# Patient Record
Sex: Male | Born: 1960
Health system: Southern US, Community
[De-identification: ages and names within clinical notes are randomized; demographics above are authoritative.]

## PROBLEM LIST (undated history)

## (undated) DIAGNOSIS — T7840XA Allergy, unspecified, initial encounter: Secondary | ICD-10-CM

## (undated) DIAGNOSIS — I10 Essential (primary) hypertension: Secondary | ICD-10-CM

## (undated) DIAGNOSIS — G4730 Sleep apnea, unspecified: Secondary | ICD-10-CM

## (undated) DIAGNOSIS — J45909 Unspecified asthma, uncomplicated: Secondary | ICD-10-CM

## (undated) HISTORY — DX: Unspecified asthma, uncomplicated: J45.909

## (undated) HISTORY — DX: Essential (primary) hypertension: I10

## (undated) HISTORY — DX: Allergy, unspecified, initial encounter: T78.40XA

## (undated) HISTORY — PX: ACHILLES TENDON REPAIR: SUR1153

## (undated) HISTORY — DX: Sleep apnea, unspecified: G47.30

## (undated) HISTORY — PX: NASAL SINUS SURGERY: SHX719

---

## 1998-11-04 ENCOUNTER — Ambulatory Visit (HOSPITAL_BASED_OUTPATIENT_CLINIC_OR_DEPARTMENT_OTHER): Admission: RE | Admit: 1998-11-04 | Discharge: 1998-11-04 | Payer: Self-pay | Admitting: Orthopedic Surgery

## 2000-04-24 ENCOUNTER — Emergency Department (HOSPITAL_COMMUNITY): Admission: EM | Admit: 2000-04-24 | Discharge: 2000-04-25 | Payer: Self-pay | Admitting: Emergency Medicine

## 2000-04-25 ENCOUNTER — Encounter: Payer: Self-pay | Admitting: Emergency Medicine

## 2005-01-19 ENCOUNTER — Emergency Department (HOSPITAL_COMMUNITY): Admission: EM | Admit: 2005-01-19 | Discharge: 2005-01-20 | Payer: Self-pay | Admitting: Emergency Medicine

## 2009-06-24 ENCOUNTER — Ambulatory Visit (HOSPITAL_COMMUNITY): Admission: RE | Admit: 2009-06-24 | Discharge: 2009-06-24 | Payer: Self-pay | Admitting: Psychiatry

## 2009-06-26 ENCOUNTER — Other Ambulatory Visit (HOSPITAL_COMMUNITY): Admission: RE | Admit: 2009-06-26 | Discharge: 2009-07-10 | Payer: Self-pay | Admitting: Psychiatry

## 2009-06-30 ENCOUNTER — Ambulatory Visit: Payer: Self-pay | Admitting: Psychiatry

## 2009-10-12 ENCOUNTER — Ambulatory Visit (HOSPITAL_BASED_OUTPATIENT_CLINIC_OR_DEPARTMENT_OTHER): Admission: RE | Admit: 2009-10-12 | Discharge: 2009-10-12 | Payer: Self-pay | Admitting: Family Medicine

## 2009-10-20 ENCOUNTER — Ambulatory Visit (HOSPITAL_BASED_OUTPATIENT_CLINIC_OR_DEPARTMENT_OTHER): Admission: RE | Admit: 2009-10-20 | Discharge: 2009-10-20 | Payer: Self-pay | Admitting: Family Medicine

## 2009-10-24 ENCOUNTER — Ambulatory Visit: Payer: Self-pay | Admitting: Internal Medicine

## 2010-08-01 ENCOUNTER — Encounter: Payer: Self-pay | Admitting: Otolaryngology

## 2011-06-18 ENCOUNTER — Ambulatory Visit (INDEPENDENT_AMBULATORY_CARE_PROVIDER_SITE_OTHER): Payer: 59

## 2011-06-18 DIAGNOSIS — J329 Chronic sinusitis, unspecified: Secondary | ICD-10-CM

## 2011-06-18 DIAGNOSIS — J3089 Other allergic rhinitis: Secondary | ICD-10-CM

## 2011-06-25 ENCOUNTER — Ambulatory Visit (INDEPENDENT_AMBULATORY_CARE_PROVIDER_SITE_OTHER): Payer: 59

## 2011-06-25 DIAGNOSIS — Z23 Encounter for immunization: Secondary | ICD-10-CM

## 2011-06-25 DIAGNOSIS — Z719 Counseling, unspecified: Secondary | ICD-10-CM

## 2011-07-09 ENCOUNTER — Ambulatory Visit (INDEPENDENT_AMBULATORY_CARE_PROVIDER_SITE_OTHER): Payer: 59

## 2011-07-09 DIAGNOSIS — J019 Acute sinusitis, unspecified: Secondary | ICD-10-CM

## 2011-07-09 DIAGNOSIS — Z719 Counseling, unspecified: Secondary | ICD-10-CM

## 2011-07-21 ENCOUNTER — Ambulatory Visit (INDEPENDENT_AMBULATORY_CARE_PROVIDER_SITE_OTHER): Payer: 59

## 2011-07-21 DIAGNOSIS — J019 Acute sinusitis, unspecified: Secondary | ICD-10-CM

## 2011-07-21 DIAGNOSIS — Z719 Counseling, unspecified: Secondary | ICD-10-CM

## 2011-07-21 DIAGNOSIS — J301 Allergic rhinitis due to pollen: Secondary | ICD-10-CM

## 2011-08-16 ENCOUNTER — Ambulatory Visit (INDEPENDENT_AMBULATORY_CARE_PROVIDER_SITE_OTHER): Payer: 59 | Admitting: Family Medicine

## 2011-08-16 DIAGNOSIS — J309 Allergic rhinitis, unspecified: Secondary | ICD-10-CM

## 2011-08-16 DIAGNOSIS — J683 Other acute and subacute respiratory conditions due to chemicals, gases, fumes and vapors: Secondary | ICD-10-CM

## 2011-08-16 DIAGNOSIS — J329 Chronic sinusitis, unspecified: Secondary | ICD-10-CM

## 2011-08-16 DIAGNOSIS — Z9109 Other allergy status, other than to drugs and biological substances: Secondary | ICD-10-CM

## 2011-08-16 DIAGNOSIS — G4730 Sleep apnea, unspecified: Secondary | ICD-10-CM | POA: Insufficient documentation

## 2011-08-16 DIAGNOSIS — I10 Essential (primary) hypertension: Secondary | ICD-10-CM

## 2011-08-16 DIAGNOSIS — J0100 Acute maxillary sinusitis, unspecified: Secondary | ICD-10-CM

## 2011-08-16 DIAGNOSIS — J42 Unspecified chronic bronchitis: Secondary | ICD-10-CM

## 2011-08-16 MED ORDER — FLUTICASONE PROPIONATE 50 MCG/ACT NA SUSP: 2.0000 | Freq: Every day | NASAL | Status: DC

## 2011-08-16 MED ORDER — METHYLPREDNISOLONE ACETATE 80 MG/ML IJ SUSP
80.0000 mg | Freq: Once | INTRAMUSCULAR | Status: AC
  Administered 2011-08-16: 80 mg via INTRAMUSCULAR

## 2011-08-16 NOTE — Progress Notes (Signed)
  Subjective:    Patient ID: Dylan Washington, male    DOB: [birth year], 51 y.o.   MRN: [id number]  HPI 51 yo AA male with pmh of chronic allergies and recurrent sinusitis.  Recently with puffy, red eyes, pnd and nocturnal cough. Started on extra prescription of Avelox last Friday.  On allergy immunotherapy([place]), has not been taking Symbicort    Review of Systems  Constitutional: Positive for fatigue.  Eyes: Positive for discharge (watery), redness and itching.  Respiratory: Positive for cough and wheezing. Negative for chest tightness and shortness of breath.        Objective:   Physical Exam  Constitutional: He appears well-developed and well-nourished.  HENT:  Head: Normocephalic and atraumatic.  Nose: Mucosal edema: pale, very swollen turbinates.  Eyes: Right eye exhibits discharge (clear). Left eye discharge: clear.  Neck: Neck supple.  Cardiovascular: Normal rate, regular rhythm and normal heart sounds.   Pulmonary/Chest: Respiratory distress: prolong exp phase.     Peak Flow 500     Assessment & Plan:   1. Recurrent sinusitis   2. Environmental allergies/ Ocular allergies  3. Reactive airways dysfunction syndrome   4. Hypertension   5. Sleep apnea     1) Complete course of Avelox, resume Symbicort 2) Continue with allegy immunotherapy 3) Flonase NS UAD, [place] UAD,  4) DM 80mg  IM 5) Work note 1/31-2/5

## 2011-08-19 ENCOUNTER — Telehealth: Payer: Self-pay

## 2011-08-19 NOTE — Telephone Encounter (Signed)
PT MENTIONED TO THE TRIAGE NURSE HE WAS OUT OF HIS BP MEDS  NEEDS REFILLS [facility] - [address]

## 2011-08-20 MED ORDER — LOSARTAN POTASSIUM-HCTZ 100-25 MG PO TABS: 1.0000 | ORAL_TABLET | Freq: Every day | ORAL | Status: DC

## 2011-08-20 MED ORDER — AMLODIPINE BESYLATE 10 MG PO TABS: 10.0000 mg | ORAL_TABLET | Freq: Every day | ORAL | Status: DC

## 2011-08-20 NOTE — Telephone Encounter (Signed)
Done. Please advise patient to contact pharmacy for future refills.  Dylan Washington

## 2011-08-21 NOTE — Telephone Encounter (Signed)
LMOM THAT RX'S WERE SENT IN

## 2011-10-02 ENCOUNTER — Ambulatory Visit (INDEPENDENT_AMBULATORY_CARE_PROVIDER_SITE_OTHER): Payer: 59 | Admitting: Family Medicine

## 2011-10-02 VITALS — BP 148/92 | HR 71 | Temp 98.2°F | Resp 16 | Ht 70.0 in | Wt 259.6 lb

## 2011-10-02 DIAGNOSIS — J329 Chronic sinusitis, unspecified: Secondary | ICD-10-CM

## 2011-10-02 DIAGNOSIS — B350 Tinea barbae and tinea capitis: Secondary | ICD-10-CM

## 2011-10-02 DIAGNOSIS — L738 Other specified follicular disorders: Secondary | ICD-10-CM

## 2011-10-02 DIAGNOSIS — R5381 Other malaise: Secondary | ICD-10-CM

## 2011-10-02 DIAGNOSIS — Z125 Encounter for screening for malignant neoplasm of prostate: Secondary | ICD-10-CM

## 2011-10-02 DIAGNOSIS — R5383 Other fatigue: Secondary | ICD-10-CM

## 2011-10-02 LAB — COMPREHENSIVE METABOLIC PANEL
Albumin: 4.8 g/dL (ref 3.5–5.2)
BUN: 11 mg/dL (ref 6–23)
CO2: 31 mEq/L (ref 19–32)
Glucose, Bld: 91 mg/dL (ref 70–99)
Potassium: 4 mEq/L (ref 3.5–5.3)
Sodium: 136 mEq/L (ref 135–145)
Total Protein: 7.4 g/dL (ref 6.0–8.3)

## 2011-10-02 LAB — TESTOSTERONE: Testosterone: 424.99 ng/dL (ref 250–890)

## 2011-10-02 LAB — CBC
MCHC: 33 g/dL (ref 30.0–36.0)
Platelets: 279 10*3/uL (ref 150–400)
RDW: 11.8 % (ref 11.5–15.5)
WBC: 9.4 10*3/uL (ref 4.0–10.5)

## 2011-10-02 LAB — TSH: TSH: 1.041 u[IU]/mL (ref 0.350–4.500)

## 2011-10-02 LAB — VITAMIN B12: Vitamin B-12: 770 pg/mL (ref 211–911)

## 2011-10-02 LAB — PSA: PSA: 1.15 ng/mL (ref <=4.00)

## 2011-10-02 MED ORDER — CEPHALEXIN 500 MG PO CAPS: 500.0000 mg | ORAL_CAPSULE | Freq: Three times a day (TID) | ORAL | Status: DC

## 2011-10-02 NOTE — Patient Instructions (Signed)
Thank you for coming in today.  I appreciate your patience as we become more comfortable with our computer system.  Today you saw Ardeen Garland, MD. I hope you feel better quickly. If you were not given printed prescriptions today, your medications have been sent to your specified pharmacy and can be picked up there.  Our office will contact you with the results of your labs.  Please allow at least a week for [place] to contact you.

## 2011-10-02 NOTE — Progress Notes (Signed)
Subjective:    Patient ID: Dylan Washington, male    DOB: Mar 23, 1961, 51 y.o.   MRN: [id number]  HPI 51 yo male with history of environmental allergies and chronic/recurrent sinusitis.  Has seen allergist, done allergy shots. Had multiple rounds of antibiotics.  Today here for several similar concerns: 1) rash - under chin/upper neck.  Light and dark, some bumps.  There about a week, waxes and wanes.  Slightly itchy.  Put an acne cream on it, no help.  No changes to soaps, creams, razors.    2) Fatigue - feels like he has been abnormally tired for several months.  Trying to do cardio twice a day for last few days, previously only was doing it once a day.  Doubled it because he was feeling tired.  Thought it would give him more energy.  Feels no energy, like something is pulling him down.  Open to possibility of depression.  Still enjoys things but not happy.  Doesn't like his job.  No energy.  Good appetite. Feels he sleeps well.  Occasionally uses klonopin. Has been on depression med in past - Paxil - years ago.  Made him feel like a zombie. Then changed to Pristiq.  Didn't work. Works out to combat depression.  Has seen Dr. Evelene Croon in the past.  (over a year ago).  3) Sinus inection again? - fatigue, wheezing at night, thicker sputum in mornings.  Runny nose.  Cough only in morning.  No sore throat.  No ear symptoms.  No fever.     Multiple visits for same over last two years.  Felt allergies may be due to dusty environment at [employer] where he works.  Last visit was 2/5.  Given 80 IM depo-medrol.   Patient is fasting today.  WAs hoping to have some bloodwork done.    Review of Systems Negative except as per HPI     Objective:   Physical Exam  Constitutional: He appears well-developed. No distress.  HENT:  Right Ear: Tympanic membrane, external ear and ear canal normal. Tympanic membrane is not injected, not scarred, not perforated, not erythematous, not retracted and not bulging.    Left Ear: Tympanic membrane, external ear and ear canal normal. Tympanic membrane is not injected, not scarred, not perforated, not erythematous, not retracted and not bulging.  Nose: No mucosal edema or rhinorrhea. Right sinus exhibits no maxillary sinus tenderness and no frontal sinus tenderness. Left sinus exhibits no maxillary sinus tenderness and no frontal sinus tenderness.  Mouth/Throat: Uvula is midline, oropharynx is clear and moist and mucous membranes are normal. No oropharyngeal exudate or tonsillar abscesses.  Cardiovascular: Normal rate, regular rhythm, normal heart sounds and intact distal pulses.   No murmur heard. Pulmonary/Chest: Effort normal and breath sounds normal. No respiratory distress. He has no wheezes. He has no rales.  Lymphadenopathy:       Head (right side): No submandibular and no preauricular adenopathy present.       Head (left side): No submandibular and no preauricular adenopathy present.       Right cervical: No superficial cervical and no posterior cervical adenopathy present.      Left cervical: No superficial cervical and no posterior cervical adenopathy present.       Right: No supraclavicular adenopathy present.       Left: No supraclavicular adenopathy present.  Skin: Skin is warm and dry.    Skin, under chin, right>left, with small pustules      Assessment & Plan:  Fatigue - check labs. Folliculitis barbae - keflex Chronic sinusitis - no treatment indicated at present.

## 2011-10-06 ENCOUNTER — Telehealth: Payer: Self-pay

## 2011-10-06 NOTE — Telephone Encounter (Signed)
    Patient calling for lab results 

## 2011-10-06 NOTE — Telephone Encounter (Signed)
Called pt and unsure if phone # listed is really his--someone else's name was listed on the voicemail. He was sent a letter with his normal labs.

## 2011-10-07 NOTE — Telephone Encounter (Signed)
Pt making a second call for lab results please call pt at [phone]

## 2011-11-14 ENCOUNTER — Ambulatory Visit (INDEPENDENT_AMBULATORY_CARE_PROVIDER_SITE_OTHER): Payer: 59 | Admitting: Family Medicine

## 2011-11-14 VITALS — BP 151/88 | HR 76 | Temp 98.6°F | Resp 18 | Ht 69.75 in | Wt 261.6 lb

## 2011-11-14 DIAGNOSIS — H109 Unspecified conjunctivitis: Secondary | ICD-10-CM

## 2011-11-14 DIAGNOSIS — R0602 Shortness of breath: Secondary | ICD-10-CM

## 2011-11-14 MED ORDER — POLYMYXIN B-TRIMETHOPRIM 10000-0.1 UNIT/ML-% OP SOLN: 1.0000 [drp] | OPHTHALMIC | Status: AC

## 2011-11-14 MED ORDER — BUDESONIDE-FORMOTEROL FUMARATE 80-4.5 MCG/ACT IN AERO: 2.0000 | INHALATION_SPRAY | Freq: Two times a day (BID) | RESPIRATORY_TRACT | Status: DC

## 2011-11-14 NOTE — Progress Notes (Signed)
  Subjective:    Patient ID: Dylan Washington, male    DOB: Jun 17, 1961, 51 y.o.   MRN: [id number]  HPI 51 yo male with allergies/asthma, chronic vs recurrent sinusitis and hypertension here with: Shortness of breath for a week.  Has been trying to stay off of antibiotics and systemic steroids as he was getting them back to back to back for months to over a year.  Had depomedrol in Feb, keflex in March (for non-sinus issue).  Is supposed to be on symbicort but stopped it over a week ago. Then SOB set in.  Also noticing left eye pink and has "goo" in it in the morning.  Worried this means he has another sinus infection.  Works in [employer].  Moved here form [place].  Allergies have been awful since he came.  Trying to get transfer back to  [employer] but not working out.   No fever.  Vomitted twice yesterday.  None today. No diarrhea.    Review of Systems Negative except as per HPI     Objective:   Physical Exam  Constitutional: He appears well-developed and well-nourished. No distress.  HENT:  Right Ear: Tympanic membrane, external ear and ear canal normal. Tympanic membrane is not injected, not scarred, not perforated, not erythematous, not retracted and not bulging.  Left Ear: Tympanic membrane, external ear and ear canal normal. Tympanic membrane is not injected, not scarred, not perforated, not erythematous, not retracted and not bulging.  Nose: No mucosal edema or rhinorrhea. Right sinus exhibits no maxillary sinus tenderness and no frontal sinus tenderness. Left sinus exhibits no maxillary sinus tenderness and no frontal sinus tenderness.  Mouth/Throat: Uvula is midline, oropharynx is clear and moist and mucous membranes are normal. No oropharyngeal exudate or tonsillar abscesses.  Eyes: Left conjunctiva is injected.  Cardiovascular: Normal rate, regular rhythm, normal heart sounds and intact distal pulses.   No murmur heard. Pulmonary/Chest: Effort normal and breath sounds normal.  No respiratory distress. He has no wheezes. He has no rales.  Lymphadenopathy:       Head (right side): No submandibular and no preauricular adenopathy present.       Head (left side): No submandibular and no preauricular adenopathy present.       Right cervical: No superficial cervical and no posterior cervical adenopathy present.      Left cervical: No superficial cervical and no posterior cervical adenopathy present.       Right: No supraclavicular adenopathy present.       Left: No supraclavicular adenopathy present.  Neurological: He is alert.  Skin: Skin is warm and dry.          Assessment & Plan:  SOB - stopped symbicort - likley worsening of allergic asthma.  Restart symbicort.  Conjunctivitis - polytrim.

## 2011-11-26 ENCOUNTER — Ambulatory Visit (INDEPENDENT_AMBULATORY_CARE_PROVIDER_SITE_OTHER): Payer: 59 | Admitting: Family Medicine

## 2011-11-26 ENCOUNTER — Ambulatory Visit: Payer: 59

## 2011-11-26 VITALS — BP 129/88 | HR 89 | Temp 97.5°F | Resp 18 | Ht 70.0 in | Wt 262.0 lb

## 2011-11-26 DIAGNOSIS — R0600 Dyspnea, unspecified: Secondary | ICD-10-CM

## 2011-11-26 DIAGNOSIS — R05 Cough: Secondary | ICD-10-CM

## 2011-11-26 DIAGNOSIS — R059 Cough, unspecified: Secondary | ICD-10-CM

## 2011-11-26 DIAGNOSIS — R0602 Shortness of breath: Secondary | ICD-10-CM

## 2011-11-26 DIAGNOSIS — J209 Acute bronchitis, unspecified: Secondary | ICD-10-CM

## 2011-11-26 LAB — POCT CBC
HCT, POC: 46.4 % (ref 43.5–53.7)
Hemoglobin: 14.8 g/dL (ref 14.1–18.1)
Lymph, poc: 3.2 (ref 0.6–3.4)
MCH, POC: 33.4 pg — AB (ref 27–31.2)
MCHC: 31.9 g/dL (ref 31.8–35.4)
POC LYMPH PERCENT: 38.1 %L (ref 10–50)
RDW, POC: 12.5 %
WBC: 8.4 10*3/uL (ref 4.6–10.2)

## 2011-11-26 NOTE — Progress Notes (Signed)
  Subjective:    Patient ID: Dylan Washington, male    DOB: 1960-09-16, 51 y.o.   MRN: [id number]  HPI Dylan Washington is a 51 y.o. male Last 2 days - trouble breathing.  Short of breath, wheezing, ha, nasal congestion.  Feels like it is hard to breathe during day.  No chest pain.  No known hx heart problems.  Dusty conditions at work - see prior office visits, and note from Dr. Perrin Maltese. Trying to get into different environment - with reasonable accomodation for work.  Different past 2 days - gasping for air - headache.  Sinus/headcongestion more past few days. Feels like moving in slow motion. No chest pain.  No known hx of heart disease, or FH CAD. No fever at home.  No known sick contacts.    Neti pot, Zyrtec, nasonex ns.    Wheezing at night.  Tobacco abuse - 3-4 cigarettes per day.  Followed by allergist - [place] [facility] - 2 shots per week - last OV 2-3 months ago.   Review of Systems  Constitutional: Negative for fever and chills.  Respiratory: Positive for cough, shortness of breath and wheezing. Negative for chest tightness.        Symbicort twice per day,  Does not have rescue inhaler. Dry cough.    Cardiovascular: Negative for chest pain.       Objective:   Physical Exam    [facility] reading (PRIMARY) by  Dr. Neva Seat: CXR - incomplete inspiration, few increased perihilar/interstitial markings without discrete infiltrate.   Results for orders placed in visit on 11/26/11  POCT CBC      Component Value Range   WBC 8.4  4.6 - 10.2 (K/uL)   Lymph, poc 3.2  0.6 - 3.4    POC LYMPH PERCENT 38.1  10 - 50 (%L)   MID (cbc) 0.6  0 - 0.9    POC MID % 7.4  0 - 12 (%M)   POC Granulocyte 4.6  2 - 6.9    Granulocyte percent 54.5  37 - 80 (%G)   RBC 4.43 (*) 4.69 - 6.13 (M/uL)   Hemoglobin 14.8  14.1 - 18.1 (g/dL)   HCT, POC 78.2  95.6 - 53.7 (%)   MCV 104.7 (*) 80 - 97 (fL)   MCH, POC 33.4 (*) 27 - 31.2 (pg)   MCHC 31.9  31.8 - 35.4 (g/dL)   RDW, POC 21.3     Platelet Count, POC 317   142 - 424 (K/uL)   MPV 9.2  0 - 99.8 (fL)  GLUCOSE, POCT (MANUAL RESULT ENTRY)      Component Value Range   POC Glucose 63 (*) 70 - 99 (mg/dl)    Peak flow 086, 578.    Assessment & Plan:  Dylan Washington is a 51 y.o. male  Cough, sinobronchitis, vs. Allergic flair. Discussed options in treatment.  No wheezing on exam, and good peak flow. Azithromycin - z pak # 1 if not improved in next few days.   Prednisone declined at present.   Check BNP.  RTC/ER if not improving in few days, ER/911 if any chest pain or worsening of symptoms. Continue same allergy/asthma meds for now.  oow x past 2 days.

## 2011-11-27 MED ORDER — AZITHROMYCIN 250 MG PO TABS: ORAL_TABLET | ORAL | Status: AC

## 2011-11-27 NOTE — Progress Notes (Signed)
Addended by: [name] R on: 11/27/2011 10:33 AM   Modules accepted: Orders

## 2011-11-27 NOTE — Progress Notes (Signed)
  Subjective:    Patient ID: Dylan Washington, male    DOB: Mar 24, 1961, 51 y.o.   MRN: [id number]  HPI    Review of Systems     Objective:   Physical Exam  Constitutional: He is oriented to person, place, and time. He appears well-developed and well-nourished. No distress.  HENT:  Head: Normocephalic and atraumatic.  Right Ear: External ear normal.  Left Ear: External ear normal.  Nose: Right sinus exhibits no maxillary sinus tenderness and no frontal sinus tenderness. Left sinus exhibits no maxillary sinus tenderness and no frontal sinus tenderness.  Mouth/Throat: Oropharynx is clear and moist.  Neck: Normal range of motion.  Cardiovascular: Normal rate, regular rhythm, normal heart sounds and intact distal pulses.  Exam reveals no gallop and no friction rub.   No murmur heard. Pulmonary/Chest: Effort normal and breath sounds normal. No respiratory distress. He has no wheezes. He has no rales.  Neurological: He is alert and oriented to person, place, and time.  Skin: Skin is warm and dry.  Psychiatric: He has a normal mood and affect. His behavior is normal.       Assessment & Plan:  See other note.

## 2011-11-29 ENCOUNTER — Ambulatory Visit (INDEPENDENT_AMBULATORY_CARE_PROVIDER_SITE_OTHER): Payer: 59 | Admitting: Internal Medicine

## 2011-11-29 VITALS — BP 136/91 | HR 80 | Temp 97.7°F | Resp 16 | Ht 70.0 in | Wt 262.0 lb

## 2011-11-29 DIAGNOSIS — R079 Chest pain, unspecified: Secondary | ICD-10-CM

## 2011-11-29 DIAGNOSIS — J45909 Unspecified asthma, uncomplicated: Secondary | ICD-10-CM

## 2011-11-29 MED ORDER — METHYLPREDNISOLONE ACETATE 80 MG/ML IJ SUSP
80.0000 mg | Freq: Once | INTRAMUSCULAR | Status: AC
  Administered 2011-11-29: 80 mg via INTRAMUSCULAR

## 2011-11-29 NOTE — Progress Notes (Signed)
  Subjective:    Patient ID: Dylan Washington, male    DOB: 1960/10/15, 51 y.o.   MRN: [id number]  HPI CXR reviewed, shows bronchitis or chronic asthma, is on z-pak Allergys are extra bad, sob congested   Review of Systems     Objective:   Physical Exam Lungs ok Heart OK       Assessment & Plan:  Asthmatic brochitis Depomedrol 80mg  IM Finish zpak

## 2011-12-09 ENCOUNTER — Telehealth: Payer: Self-pay

## 2011-12-09 NOTE — Telephone Encounter (Signed)
The patient came in to leave a Duty Status Report for Dr. Perrin Maltese to complete. 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

## 2011-12-12 NOTE — Telephone Encounter (Signed)
LMOM that form is ready for p/up. 

## 2011-12-12 NOTE — Telephone Encounter (Signed)
Dr Perrin Maltese, [facility] is bringing you this form to 104 this morning.

## 2011-12-23 ENCOUNTER — Ambulatory Visit (INDEPENDENT_AMBULATORY_CARE_PROVIDER_SITE_OTHER): Payer: 59 | Admitting: Internal Medicine

## 2011-12-23 VITALS — BP 140/88 | HR 86 | Temp 98.6°F | Resp 16 | Ht 70.0 in | Wt 264.0 lb

## 2011-12-23 DIAGNOSIS — Z9109 Other allergy status, other than to drugs and biological substances: Secondary | ICD-10-CM

## 2011-12-23 DIAGNOSIS — J9801 Acute bronchospasm: Secondary | ICD-10-CM

## 2011-12-23 DIAGNOSIS — J329 Chronic sinusitis, unspecified: Secondary | ICD-10-CM

## 2011-12-23 DIAGNOSIS — J309 Allergic rhinitis, unspecified: Secondary | ICD-10-CM

## 2011-12-23 MED ORDER — AMOXICILLIN 500 MG PO CAPS: 1000.0000 mg | ORAL_CAPSULE | Freq: Two times a day (BID) | ORAL | Status: DC

## 2011-12-23 NOTE — Patient Instructions (Signed)
Place patient instructions, either created by your organization or obtained from a 3rd party, here. 

## 2011-12-23 NOTE — Progress Notes (Signed)
  Subjective:    Patient ID: Dylan Washington, male    DOB: 1961/06/19, 51 y.o.   MRN: [id number]  HPI Feels sick, usually triggered by working indoors at [employer], cough and sinus pressure With disch.   Review of Systems unchanged    Objective:   Physical Exam  Constitutional: He is oriented to person, place, and time. He appears well-developed and well-nourished. No distress.  HENT:  Head: Normocephalic.  Right Ear: External ear normal. Tympanic membrane is erythematous.  Left Ear: External ear normal. Tympanic membrane is erythematous.  Nose: Mucosal edema, rhinorrhea and sinus tenderness present. Right sinus exhibits maxillary sinus tenderness and frontal sinus tenderness.  Cardiovascular: Normal rate, regular rhythm and normal heart sounds.   Pulmonary/Chest: Effort normal. He has no wheezes. He exhibits no tenderness.  Neurological: He is alert and oriented to person, place, and time.  Skin: Skin is warm and dry.  Psychiatric: He has a normal mood and affect. His behavior is normal.          Assessment & Plan:  Sinusitis Severe allergys triggered by worksite dust and environment--must avoid

## 2012-01-17 ENCOUNTER — Ambulatory Visit (INDEPENDENT_AMBULATORY_CARE_PROVIDER_SITE_OTHER): Payer: 59 | Admitting: Internal Medicine

## 2012-01-17 VITALS — BP 154/92 | HR 83 | Temp 97.9°F | Resp 16 | Ht 70.0 in | Wt 267.0 lb

## 2012-01-17 DIAGNOSIS — Z789 Other specified health status: Secondary | ICD-10-CM

## 2012-01-17 DIAGNOSIS — J45902 Unspecified asthma with status asthmaticus: Secondary | ICD-10-CM

## 2012-01-17 DIAGNOSIS — J45909 Unspecified asthma, uncomplicated: Secondary | ICD-10-CM | POA: Insufficient documentation

## 2012-01-17 DIAGNOSIS — Z719 Counseling, unspecified: Secondary | ICD-10-CM

## 2012-01-17 DIAGNOSIS — J301 Allergic rhinitis due to pollen: Secondary | ICD-10-CM

## 2012-01-17 NOTE — Progress Notes (Signed)
  Subjective:    Patient ID: Dylan Washington, male    DOB: 1961-04-19, 51 y.o.   MRN: [id number]  HPI See allergy evaluation by Dr. Duryea Callas recently done. Has severe environmental exposure at work that needs to be addressed. Sxs are chronic and ongoing   Review of Systems     Objective:   Physical Exam Unchanged Chronic sino/pulmonary disease       Assessment & Plan:  Letter for work to avoid allergen and irritant exposure as much as possible.

## 2012-02-18 ENCOUNTER — Ambulatory Visit (INDEPENDENT_AMBULATORY_CARE_PROVIDER_SITE_OTHER): Payer: 59 | Admitting: Family Medicine

## 2012-02-18 VITALS — BP 145/89 | HR 84 | Temp 98.3°F | Resp 16 | Ht 71.0 in | Wt 260.8 lb

## 2012-02-18 DIAGNOSIS — J309 Allergic rhinitis, unspecified: Secondary | ICD-10-CM

## 2012-02-18 DIAGNOSIS — J329 Chronic sinusitis, unspecified: Secondary | ICD-10-CM

## 2012-02-18 DIAGNOSIS — I10 Essential (primary) hypertension: Secondary | ICD-10-CM

## 2012-02-18 LAB — POCT CBC
Granulocyte percent: 58.8 %G (ref 37–80)
HCT, POC: 49.6 % (ref 43.5–53.7)
Lymph, poc: 3 (ref 0.6–3.4)
MCHC: 30.4 g/dL — AB (ref 31.8–35.4)
MID (cbc): 0.5 (ref 0–0.9)
POC Granulocyte: 5 (ref 2–6.9)
POC LYMPH PERCENT: 35.3 %L (ref 10–50)
Platelet Count, POC: 391 10*3/uL (ref 142–424)
RDW, POC: 12.2 %

## 2012-02-18 LAB — BASIC METABOLIC PANEL
BUN: 11 mg/dL (ref 6–23)
Calcium: 9.6 mg/dL (ref 8.4–10.5)
Glucose, Bld: 89 mg/dL (ref 70–99)
Potassium: 4.1 mEq/L (ref 3.5–5.3)
Sodium: 136 mEq/L (ref 135–145)

## 2012-02-18 LAB — GLUCOSE, POCT (MANUAL RESULT ENTRY): POC Glucose: 85 mg/dl (ref 70–99)

## 2012-02-18 MED ORDER — LOSARTAN POTASSIUM-HCTZ 100-25 MG PO TABS: 1.0000 | ORAL_TABLET | Freq: Every day | ORAL | Status: DC

## 2012-02-18 MED ORDER — AMLODIPINE BESYLATE 10 MG PO TABS: 10.0000 mg | ORAL_TABLET | Freq: Every day | ORAL | Status: DC

## 2012-02-18 MED ORDER — MOMETASONE FUROATE 50 MCG/ACT NA SUSP: 2.0000 | Freq: Every day | NASAL | Status: DC

## 2012-02-18 MED ORDER — METHYLPREDNISOLONE ACETATE 80 MG/ML IJ SUSP
80.0000 mg | Freq: Once | INTRAMUSCULAR | Status: AC
  Administered 2012-02-18: 80 mg via INTRAMUSCULAR

## 2012-02-18 NOTE — Patient Instructions (Signed)
Your should receive a call or letter about your lab results within the next week to 10 days. Keep a record of your blood pressures outside of the office and bring them to the next office visit, and follow up with your primary provider in the next 2-3 months to evaluate your blood pressure.  Return to the clinic or go to the nearest emergency room if any of your symptoms worsen or new symptoms occur.

## 2012-02-18 NOTE — Progress Notes (Signed)
Subjective:    Patient ID: Dylan Washington, male    DOB: 08-Aug-1960, 51 y.o.   MRN: [id number]  HPI Dylan Washington is a 51 y.o. male Hx of environmental allergies/allergic rhinitis - followed by Dr. [place] Callas. Still taking allergy shots. Ran out of nasonex NS 1 week ago, taking other prescription nasal spray (unknown name), netipot, symbicort, and zyrtec qhs - no missed doses.   Today c/o cough, sore throat, fatigue and both eyes hurt.  Started 6 days ago.  Initially had general fatigue, then cough, sore throat started 2 days ago -only in am.   Coughing noted more in mornings and evenings. Sore throat.  Feels like usual allergy symptoms, during flair.  Usually improves with steroid shot.    Needs refills of high blood pressures.   Not checking blood pressures outside of office.     Review of Systems  Constitutional: Negative for fever, chills, fatigue and unexpected weight change.  Eyes: Positive for pain. Negative for redness and visual disturbance.  Respiratory: Positive for cough and shortness of breath (a little bit - past day or two.  last albuterol use 1 week ago.  ). Negative for chest tightness.   Cardiovascular: Negative for chest pain, palpitations and leg swelling.  Gastrointestinal: Negative for abdominal pain and blood in stool.  Neurological: Negative for dizziness, light-headedness and headaches.       Objective:   Physical Exam  Constitutional: He is oriented to person, place, and time. He appears well-developed and well-nourished.  HENT:  Head: Atraumatic. Macrocephalic.  Right Ear: Tympanic membrane, external ear and ear canal normal.  Left Ear: Tympanic membrane, external ear and ear canal normal.  Nose: Mucosal edema and rhinorrhea present. Right sinus exhibits no maxillary sinus tenderness and no frontal sinus tenderness. Left sinus exhibits no maxillary sinus tenderness and no frontal sinus tenderness.  Mouth/Throat: Oropharynx is clear and moist and mucous membranes  are normal. No oropharyngeal exudate or posterior oropharyngeal erythema.  Eyes: Conjunctivae are normal. Pupils are equal, round, and reactive to light.  Neck: Neck supple.  Cardiovascular: Normal rate, regular rhythm, normal heart sounds and intact distal pulses.   No murmur heard. Pulmonary/Chest: Effort normal and breath sounds normal. He has no wheezes. He has no rhonchi. He has no rales.  Abdominal: Soft. There is no tenderness.  Lymphadenopathy:    He has no cervical adenopathy.  Neurological: He is alert and oriented to person, place, and time.  Skin: Skin is warm and dry. No rash noted.  Psychiatric: He has a normal mood and affect. His behavior is normal.   Results for orders placed in visit on 02/18/12  POCT CBC      Component Value Range   WBC 8.5  4.6 - 10.2 K/uL   Lymph, poc 3.0  0.6 - 3.4   POC LYMPH PERCENT 35.3  10 - 50 %L   MID (cbc) 0.5  0 - 0.9   POC MID % 5.9  0 - 12 %M   POC Granulocyte 5.0  2 - 6.9   Granulocyte percent 58.8  37 - 80 %G   RBC 4.71  4.69 - 6.13 M/uL   Hemoglobin 15.1  14.1 - 18.1 g/dL   HCT, POC 40.9  81.1 - 53.7 %   MCV 105.4 (*) 80 - 97 fL   MCH, POC 32.1 (*) 27 - 31.2 pg   MCHC 30.4 (*) 31.8 - 35.4 g/dL   RDW, POC 91.4     Platelet Count, POC 391  142 - 424 K/uL   MPV 9.4  0 - 99.8 fL  GLUCOSE, POCT (MANUAL RESULT ENTRY)      Component Value Range   POC Glucose 85  70 - 99 mg/dl      Assessment & Plan:  Dylan Washington is a 51 y.o. male 1. Hypertension  Basic metabolic panel, POCT glucose (manual entry)  2. Sinusitis  POCT CBC  3. Allergic rhinitis  POCT glucose (manual entry)   Congestion, intermittent sore throat, eye pain - 6 days.  ddx viral syndrome vs early sinusitis with underlying allergic rhinitis - with possible flair.  depomedrol 80mg  IM x 1, nasonex refilled, continue symbicort, and zyrtec, cont allergy shots and treatment form allergist.  Ok to use albuterol inh if increased cough, wheeze or dyspnea (no wheeze on exam  today). If not improving in next 1-2 days, can fill amox 875mg  BID x 10 days - SED.  HTN - borderline control - elevated here.  Check outside bp's and follow up in next 2-3 months.  Sooner if remaining above 140/90 outside of office.  BMP pending. meds refilled for 6 months.   rtc precautions discussed.

## 2012-02-19 ENCOUNTER — Other Ambulatory Visit: Payer: Self-pay | Admitting: Family Medicine

## 2012-02-19 MED ORDER — AMOXICILLIN 875 MG PO TABS: 875.0000 mg | ORAL_TABLET | Freq: Two times a day (BID) | ORAL | Status: AC

## 2012-04-05 ENCOUNTER — Encounter: Payer: Self-pay | Admitting: Physician Assistant

## 2012-04-05 DIAGNOSIS — J45909 Unspecified asthma, uncomplicated: Secondary | ICD-10-CM

## 2012-04-05 DIAGNOSIS — Z9109 Other allergy status, other than to drugs and biological substances: Secondary | ICD-10-CM

## 2012-07-03 ENCOUNTER — Ambulatory Visit (INDEPENDENT_AMBULATORY_CARE_PROVIDER_SITE_OTHER): Payer: 59 | Admitting: Family Medicine

## 2012-07-03 VITALS — BP 138/88 | HR 85 | Temp 97.6°F | Resp 18 | Ht 71.0 in | Wt 271.0 lb

## 2012-07-03 DIAGNOSIS — J329 Chronic sinusitis, unspecified: Secondary | ICD-10-CM

## 2012-07-03 DIAGNOSIS — H109 Unspecified conjunctivitis: Secondary | ICD-10-CM

## 2012-07-03 MED ORDER — TOBRAMYCIN 0.3 % OP SOLN: 1.0000 [drp] | Freq: Four times a day (QID) | OPHTHALMIC | Status: DC

## 2012-07-03 MED ORDER — METHYLPREDNISOLONE ACETATE 80 MG/ML IJ SUSP: 80.0000 mg | Freq: Once | INTRAMUSCULAR | Status: DC

## 2012-07-03 MED ORDER — BECLOMETHASONE DIPROPIONATE 80 MCG/ACT NA AERS: 2.0000 | INHALATION_SPRAY | NASAL | Status: DC

## 2012-07-03 MED ORDER — AMOXICILLIN 875 MG PO TABS: 875.0000 mg | ORAL_TABLET | Freq: Two times a day (BID) | ORAL | Status: DC

## 2012-07-03 NOTE — Progress Notes (Signed)
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  4 days of sinus congestion and injection, discharge left eye.  Objective:  NAD

## 2012-07-03 NOTE — Patient Instructions (Signed)

## 2012-07-14 ENCOUNTER — Other Ambulatory Visit: Payer: Self-pay | Admitting: Family Medicine

## 2012-09-13 ENCOUNTER — Ambulatory Visit (INDEPENDENT_AMBULATORY_CARE_PROVIDER_SITE_OTHER): Payer: 59 | Admitting: Family Medicine

## 2012-09-13 VITALS — BP 118/68 | HR 60 | Temp 98.0°F | Resp 16 | Ht 70.0 in | Wt 264.0 lb

## 2012-09-13 DIAGNOSIS — R5383 Other fatigue: Secondary | ICD-10-CM

## 2012-09-13 DIAGNOSIS — L819 Disorder of pigmentation, unspecified: Secondary | ICD-10-CM

## 2012-09-13 DIAGNOSIS — R109 Unspecified abdominal pain: Secondary | ICD-10-CM

## 2012-09-13 DIAGNOSIS — I10 Essential (primary) hypertension: Secondary | ICD-10-CM

## 2012-09-13 DIAGNOSIS — R5381 Other malaise: Secondary | ICD-10-CM

## 2012-09-13 LAB — POCT CBC
Granulocyte percent: 55.9 %G (ref 37–80)
HCT, POC: 46.7 % (ref 43.5–53.7)
MCH, POC: 33.7 pg — AB (ref 27–31.2)
MCV: 105 fL — AB (ref 80–97)
POC LYMPH PERCENT: 37.4 %L (ref 10–50)
RBC: 4.45 M/uL — AB (ref 4.69–6.13)
WBC: 10.2 10*3/uL (ref 4.6–10.2)

## 2012-09-13 LAB — POCT UA - MICROSCOPIC ONLY
Casts, Ur, LPF, POC: NEGATIVE
Epithelial cells, urine per micros: NEGATIVE
Mucus, UA: NEGATIVE
Yeast, UA: NEGATIVE

## 2012-09-13 LAB — POCT URINALYSIS DIPSTICK
Ketones, UA: NEGATIVE
Leukocytes, UA: NEGATIVE
Protein, UA: NEGATIVE
Urobilinogen, UA: 0.2
pH, UA: 6

## 2012-09-13 NOTE — Patient Instructions (Addendum)
Other malaise and fatigue - Plan: POCT CBC, TSH, Testosterone, free, total  Flank pain - Plan: POCT urinalysis dipstick, POCT UA - Microscopic Only, POCT CBC, Comprehensive metabolic panel  Hyperpigmentation  Essential hypertension, benign

## 2012-09-13 NOTE — Progress Notes (Signed)
[address]   [place], [place]  [address]   [phone]  Subjective:    Patient ID: Dylan Washington, male    DOB: 11/09/1960, 52 y.o.   MRN: [id number]  HPI This 52 y.o. male presents for evaluation of the following:  1.  Fatigue:  Onset when reached 52 years old with recent worsening; worsening for two weeks.  Uses CPAP nightly with good compliance for OSA.    2.  Skin darkening:  In morning time.  Feels sick; coloring not right.  Worried about liver function. Wants some baseline labs.  Coworkers have commented on his coloring.  3.  Moodiness: started P6 Extreme supplement last week; worried about kidney and liver function; stopped medication today.  Not feeling well.  4  Back pain: onset 1.5 weeks ago.  B lower back pain. Does not feel like back pain/strain; aggravating pain; no sharp shooting pain; nagging pain.  No radiation into legs; no n/t/w.  Normal b/b function.  Urinating and stooling more frequent.  No hematuria; nocturia x 1-2 with baseline x 1.  Pain does not worsen with change of position or with range of motion.    5. HTN: impressed with BP this evening; BP lower than normal.     Review of Systems  Constitutional: Positive for fatigue. Negative for fever, chills and diaphoresis.  HENT: Positive for congestion. Negative for ear pain, rhinorrhea and postnasal drip.   Respiratory: Negative for cough, shortness of breath and wheezing.   Cardiovascular: Negative for chest pain, palpitations and leg swelling.  Gastrointestinal: Negative for nausea, vomiting, abdominal pain, diarrhea and constipation.  Endocrine: Negative for cold intolerance, heat intolerance, polydipsia, polyphagia and polyuria.  Musculoskeletal: Positive for back pain.  Skin: Negative for rash.  Allergic/Immunologic: Positive for environmental allergies. Negative for immunocompromised state.  Neurological: Negative for dizziness, tremors, syncope, facial asymmetry, speech difficulty, weakness,  light-headedness, numbness and headaches.  Psychiatric/Behavioral:       Moodiness.        Past Medical History  Diagnosis Date  . Hypertension   . Sleep apnea   . Asthma   . Allergy     allergy shots at [facility]    Past Surgical History  Procedure Laterality Date  . Achilles tendon repair      Prior to Admission medications   Medication Sig Start Date End Date Taking? Authorizing Provider  amLODipine (NORVASC) 10 MG tablet Take 1 tablet (10 mg total) by mouth daily. 02/18/12  Yes Shade Flood, MD  Beclomethasone Dipropionate 80 MCG/ACT AERS Place 2 sprays into the nose 1 day or 1 dose. 07/03/12  Yes Elvina Sidle, MD  budesonide-formoterol [facility]) 80-4.5 MCG/ACT inhaler Inhale 2 puffs into the lungs 2 (two) times daily.   Yes Historical Provider, MD  cetirizine (ZYRTEC) 10 MG tablet Take 10 mg by mouth daily.   Yes Historical Provider, MD  losartan-hydrochlorothiazide (HYZAAR) 100-25 MG per tablet Take 1 tablet by mouth daily. 02/18/12  Yes Shade Flood, MD  Multiple Vitamins-Minerals (ADEKS) chewable tablet Chew 1 tablet by mouth daily.   Yes Historical Provider, MD  tobramycin (TOBREX) 0.3 % ophthalmic solution Place 1 drop into the left eye every 6 (six) hours. 07/03/12  Yes Elvina Sidle, MD  amoxicillin (AMOXIL) 875 MG tablet Take 1 tablet (875 mg total) by mouth 2 (two) times daily. 07/03/12   Elvina Sidle, MD  mometasone (NASONEX) 50 MCG/ACT nasal spray Place 2 sprays into the nose daily. 02/18/12   Shade Flood,  MD    No Known Allergies  History   Social History  . Marital Status: Single    Spouse Name: N/A    Number of Children: N/A  . Years of Education: N/A   Occupational History  . Not on file.   Social History Main Topics  . Smoking status: Former Smoker    Quit date: 06/08/2011  . Smokeless tobacco: Not on file  . Alcohol Use: No  . Drug Use: No  . Sexually Active: Yes   Other Topics Concern  . Not on file   Social  History Narrative   Marital status: married;       Children: none      Lives: with wife.      Employment: works at [employer]; Solicitor.      Tobacco: none      Alcohol: none      Drugs: none      Exercise: gym 4-5 days per week.    Family History  Problem Relation Age of Onset  . Hypertension Mother   . Hypertension Father     Objective:   Physical Exam  Nursing note and vitals reviewed. Constitutional: He is oriented to person, place, and time. He appears well-developed and well-nourished. No distress.  HENT:  Head: Normocephalic and atraumatic.  Right Ear: External ear normal.  Left Ear: External ear normal.  Nose: Nose normal.  Mouth/Throat: Oropharynx is clear and moist.  Eyes: Conjunctivae and EOM are normal. Pupils are equal, round, and reactive to light.  Neck: Normal range of motion. Neck supple. No JVD present. No thyromegaly present.  Cardiovascular: Normal rate, regular rhythm and normal heart sounds.  Exam reveals no gallop and no friction rub.   No murmur heard. Pulmonary/Chest: Effort normal and breath sounds normal. He has no wheezes. He has no rales.  Abdominal: Soft. Bowel sounds are normal. There is no tenderness. There is no rebound and no guarding.  Musculoskeletal:       Right shoulder: Normal.       Left shoulder: Normal.       Cervical back: Normal.       Thoracic back: Normal.       Lumbar back: Normal.  Lymphadenopathy:    He has no cervical adenopathy.  Neurological: He is alert and oriented to person, place, and time. No cranial nerve deficit. He exhibits normal muscle tone. Coordination normal.  Skin: Skin is warm and dry. No rash noted. He is not diaphoretic.  Psychiatric: He has a normal mood and affect. His behavior is normal. Judgment and thought content normal.       Assessment & Plan:  Other malaise and fatigue - Plan: POCT CBC, TSH, Testosterone, free, total  Flank pain - Plan: POCT urinalysis dipstick, POCT UA - Microscopic Only,  POCT CBC, Comprehensive metabolic panel  Hyperpigmentation  Essential hypertension, benign   1.  Malaise/Fatigue:  New.  Onset at age 50. Obtain labs.  Compliance with CPAP machine for OSA.   2.  Flank pain:  New. Onset this week.  Benign musculoskeletal exam.  Normal urine.  Obtain labs.  If persists, will warrant LS spine films; pt declined today. 3.  Hyperpigmentation:  New. Obtain labs.  Advised to stop supplement. 4.  HTN: Controlled; continue current medications.  No orders of the defined types were placed in this encounter.

## 2012-09-16 LAB — COMPREHENSIVE METABOLIC PANEL
ALT: 32 U/L (ref 0–53)
CO2: 27 mEq/L (ref 19–32)
Calcium: 9.7 mg/dL (ref 8.4–10.5)
Chloride: 103 mEq/L (ref 96–112)
Sodium: 139 mEq/L (ref 135–145)
Total Protein: 7.1 g/dL (ref 6.0–8.3)

## 2012-09-18 LAB — TESTOSTERONE, FREE, TOTAL, SHBG
Sex Hormone Binding: 26 nmol/L (ref 13–71)
Testosterone, Free: 62.4 pg/mL (ref 47.0–244.0)
Testosterone-% Free: 2.2 % (ref 1.6–2.9)
Testosterone: 280 ng/dL — ABNORMAL LOW (ref 300–890)

## 2012-10-03 ENCOUNTER — Other Ambulatory Visit: Payer: Self-pay | Admitting: Radiology

## 2012-10-03 ENCOUNTER — Ambulatory Visit (INDEPENDENT_AMBULATORY_CARE_PROVIDER_SITE_OTHER): Payer: 59 | Admitting: Family Medicine

## 2012-10-03 ENCOUNTER — Encounter: Payer: Self-pay | Admitting: Family Medicine

## 2012-10-03 VITALS — BP 118/76 | HR 83 | Temp 98.0°F | Resp 16 | Ht 69.5 in | Wt 263.0 lb

## 2012-10-03 DIAGNOSIS — E291 Testicular hypofunction: Secondary | ICD-10-CM

## 2012-10-03 DIAGNOSIS — J019 Acute sinusitis, unspecified: Secondary | ICD-10-CM

## 2012-10-03 MED ORDER — CEFDINIR 300 MG PO CAPS: 300.0000 mg | ORAL_CAPSULE | Freq: Two times a day (BID) | ORAL | Status: DC

## 2012-10-03 MED ORDER — TESTOSTERONE CYPIONATE 200 MG/ML IM SOLN: 200.0000 mg | INTRAMUSCULAR | Status: DC

## 2012-10-03 NOTE — Patient Instructions (Signed)

## 2012-10-03 NOTE — Progress Notes (Signed)
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  He senses his skin is getting dark.   He is exercising regularly.  He  Recently had low testosteron diagnosed.  Also has had three weeks of sinus congertion without fever.  Objective:  NAD Some prominent eyes Alert, good eye contact Chest:  Clear Skin: clear Nose:  mp discharge TM's:  Red right side   Assessment:  Low T Sinus infection with right otitis.  Plan:   Hypogonadism male - Plan: testosterone cypionate (DEPO-TESTOSTERONE) 200 MG/ML injection  Sinusitis, acute - Plan: cefdinir (OMNICEF) 300 MG capsule

## 2012-10-04 ENCOUNTER — Ambulatory Visit (INDEPENDENT_AMBULATORY_CARE_PROVIDER_SITE_OTHER): Payer: 59 | Admitting: *Deleted

## 2012-10-04 DIAGNOSIS — R7989 Other specified abnormal findings of blood chemistry: Secondary | ICD-10-CM

## 2012-10-04 DIAGNOSIS — E291 Testicular hypofunction: Secondary | ICD-10-CM

## 2012-10-04 MED ORDER — TESTOSTERONE CYPIONATE 200 MG/ML IM SOLN
200.0000 mg | Freq: Once | INTRAMUSCULAR | Status: AC
  Administered 2012-10-04: 200 mg via INTRAMUSCULAR

## 2012-10-04 MED ORDER — TESTOSTERONE CYPIONATE 200 MG/ML IM SOLN
200.0000 mg | INTRAMUSCULAR | Status: DC
  Administered 2012-11-05 – 2012-12-07 (×2): 200 mg via INTRAMUSCULAR

## 2012-10-04 NOTE — Progress Notes (Signed)
Pt came in to 102 10/03/12 for f-up.

## 2012-11-01 ENCOUNTER — Ambulatory Visit: Payer: 59 | Admitting: Family Medicine

## 2012-11-01 NOTE — Progress Notes (Signed)
  Subjective:    Patient ID: Dylan Washington, male    DOB: 1961/05/23, 52 y.o.   MRN: [id number]  HPI pt presented for testosterone injection but was too early. Per Chelle, ok to change order from future order to standing order. Pt left before but will return in 3 days to get injection according to date in epic.    Review of Systems     Objective:   Physical Exam        Assessment & Plan:

## 2012-11-05 ENCOUNTER — Ambulatory Visit (INDEPENDENT_AMBULATORY_CARE_PROVIDER_SITE_OTHER): Payer: 59 | Admitting: *Deleted

## 2012-11-05 DIAGNOSIS — E291 Testicular hypofunction: Secondary | ICD-10-CM

## 2012-11-05 DIAGNOSIS — R7989 Other specified abnormal findings of blood chemistry: Secondary | ICD-10-CM

## 2012-11-05 NOTE — Progress Notes (Signed)
  Subjective:    Patient ID: Dylan Washington, male    DOB: 1960/08/21, 52 y.o.   MRN: [id number]  HPI    Review of Systems     Objective:   Physical Exam        Assessment & Plan:  Patient here for testosterone injection only.

## 2012-12-07 ENCOUNTER — Ambulatory Visit (INDEPENDENT_AMBULATORY_CARE_PROVIDER_SITE_OTHER): Payer: 59 | Admitting: *Deleted

## 2012-12-07 DIAGNOSIS — E291 Testicular hypofunction: Secondary | ICD-10-CM

## 2012-12-07 NOTE — Progress Notes (Signed)
  Subjective:    Patient ID: Dylan Washington, male    DOB: 1961/07/10, 52 y.o.   MRN: [id number]  HPI    Review of Systems     Objective:   Physical Exam        Assessment & Plan:  Patient here for testosterone injection 1 ml given in left gluteal IM. Patient was 3 days overdue.

## 2012-12-30 ENCOUNTER — Ambulatory Visit (INDEPENDENT_AMBULATORY_CARE_PROVIDER_SITE_OTHER): Payer: 59 | Admitting: Family Medicine

## 2012-12-30 VITALS — BP 134/94 | HR 87 | Temp 97.6°F | Resp 18 | Wt 266.0 lb

## 2012-12-30 DIAGNOSIS — J019 Acute sinusitis, unspecified: Secondary | ICD-10-CM

## 2012-12-30 DIAGNOSIS — J329 Chronic sinusitis, unspecified: Secondary | ICD-10-CM

## 2012-12-30 MED ORDER — CEFDINIR 300 MG PO CAPS: 300.0000 mg | ORAL_CAPSULE | Freq: Two times a day (BID) | ORAL | Status: DC

## 2012-12-30 NOTE — Progress Notes (Signed)
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 He has no symptoms of reflux. He's had no fever.  He's also concerned with a reddish tinge to his skin.  Objective: No acute distress HEENT: Unremarkable except for mucopurulent discharge nasal passages. He does have mild tenderness over his malar regions. Chest: Clear Heart: Regular no murmur Skin: Normal color  Assessment: Sinusitis  Plan:  Sinusitis, acute - Plan: cefdinir (OMNICEF) 300 MG capsule  Signed, Elvina Sidle, MD

## 2012-12-30 NOTE — Patient Instructions (Signed)

## 2013-01-08 ENCOUNTER — Encounter: Payer: Self-pay | Admitting: Family Medicine

## 2013-01-08 ENCOUNTER — Ambulatory Visit (INDEPENDENT_AMBULATORY_CARE_PROVIDER_SITE_OTHER): Payer: 59 | Admitting: Family Medicine

## 2013-01-08 VITALS — BP 165/101 | HR 60 | Temp 98.1°F | Resp 18 | Ht 69.5 in | Wt 263.0 lb

## 2013-01-08 DIAGNOSIS — I10 Essential (primary) hypertension: Secondary | ICD-10-CM

## 2013-01-08 DIAGNOSIS — R7989 Other specified abnormal findings of blood chemistry: Secondary | ICD-10-CM

## 2013-01-08 DIAGNOSIS — R42 Dizziness and giddiness: Secondary | ICD-10-CM

## 2013-01-08 DIAGNOSIS — E291 Testicular hypofunction: Secondary | ICD-10-CM

## 2013-01-08 MED ORDER — TESTOSTERONE CYPIONATE 200 MG/ML IM SOLN
200.0000 mg | INTRAMUSCULAR | Status: DC
  Administered 2013-01-08: 200 mg via INTRAMUSCULAR

## 2013-01-08 MED ORDER — MECLIZINE HCL 25 MG PO TABS: 25.0000 mg | ORAL_TABLET | Freq: Three times a day (TID) | ORAL | Status: DC | PRN

## 2013-01-08 NOTE — Progress Notes (Signed)
52 year old gentleman who recently came in with a sinus infection. He did his antibiotics and is doing better until yesterday developed some dizziness when he changes position. He stopped taking all of his medicine. He's having no headache, loss of hearing, chest pain, shortness of breath, or focal weakness  Objective: Patient's blood pressure is 180/110 sitting, 180/120 standing  HEENT: Unremarkable with exception of some mild serous otitis behind each eardrum Chest: Clear Heart: Regular no murmur, gallop, or rub Extremities: No edema Skin: Clear  Assessment: Acute vertigo which is mild, uncontrolled blood pressure, resolved sinus infection  Plan: Give testosterone shot is is due, start meclizine,, recent restart blood pressure medicine, call if symptoms persist 2448 hours  Signed, Sheila Oats.D.

## 2013-01-23 ENCOUNTER — Ambulatory Visit (INDEPENDENT_AMBULATORY_CARE_PROVIDER_SITE_OTHER): Payer: 59 | Admitting: Family Medicine

## 2013-01-23 VITALS — BP 120/80 | HR 85 | Temp 98.0°F | Resp 16 | Ht 71.0 in | Wt 274.0 lb

## 2013-01-23 DIAGNOSIS — N529 Male erectile dysfunction, unspecified: Secondary | ICD-10-CM

## 2013-01-23 DIAGNOSIS — Z9109 Other allergy status, other than to drugs and biological substances: Secondary | ICD-10-CM

## 2013-01-23 DIAGNOSIS — J329 Chronic sinusitis, unspecified: Secondary | ICD-10-CM

## 2013-01-23 DIAGNOSIS — R42 Dizziness and giddiness: Secondary | ICD-10-CM

## 2013-01-23 MED ORDER — SILDENAFIL CITRATE 100 MG PO TABS: 50.0000 mg | ORAL_TABLET | Freq: Every day | ORAL | Status: DC | PRN

## 2013-01-23 MED ORDER — FLUTICASONE PROPIONATE 50 MCG/ACT NA SUSP: 2.0000 | Freq: Every day | NASAL | Status: DC

## 2013-01-23 NOTE — Progress Notes (Signed)
52 year old gentleman with lightheadedness. He was treated with meclizine and seemed to improve somewhat. Nevertheless he still has the lightheadedness whenever he looks down or lies down. Is now followed by headache or loss of hearing.  Patient also would like to discuss erectile dysfunction.  Objective: Patient appears in no acute distress HEENT: Resolving serous otitis, otherwise negative Blood pressure lying and sitting and standing do not change in her about 140/90 Neck: Supple no adenopathy Chest: Clear Heart: Regular no murmur or gallop Abdomen soft nontender Extremities: No edema Gait: Stable  Assessment: Persistent lightheadedness which I think is related to his stress level. He's going away for the weekend and hopefully this will help. He also has a history of serous otitis and allergies so I think that Flonase will help him with this.  He has mild erectile dysfunction and high-grade given appropriate prescription for Viagra with a coupon. He has 4 refills.

## 2013-01-23 NOTE — Addendum Note (Signed)
Addended by: Johnnette Litter on: 01/23/2013 07:32 PM   Modules accepted: Orders

## 2013-01-23 NOTE — Progress Notes (Signed)
52 yo with persistent lightheadedness, perhaps a bit better on meclizine, and under great stress.  Symptoms reproduced by lying down or looking down. No headache  Objective:  NAD HEENT:  Mild serous changes on TM's Orthostatic blood pressures do not change with position Chest:  Clear Heart:  Reg, no murmur Ext: no edema Neck:  Supple, no adenop   Results for orders placed in visit on 09/13/12  TSH      Result Value Range   TSH 1.988  0.350 - 4.500 uIU/mL  TESTOSTERONE, FREE, TOTAL      Result Value Range   Testosterone 280 (*) 300 - 890 ng/dL   Sex Hormone Binding 26  13 - 71 nmol/L   Testosterone, Free 62.4  47.0 - 244.0 pg/mL   Testosterone-% Freee. 2.2  1.6 - 2.9 %  COMPREHENSIVE METABOLIC PANEL      Result Value Range   Sodium 139  135 - 145 mEq/L   Potassium 3.9  3.5 - 5.3 mEq/L   Chloride 103  96 - 112 mEq/L   CO2 27  19 - 32 mEq/L   Glucose, Bld 97  70 - 99 mg/dL   BUN 16  6 - 23 mg/dL   Creat 1.61  0.96 - 0.45 mg/dL   Total Bilirubin 0.8  0.3 - 1.2 mg/dL   Alkaline Phosphatase 56  39 - 117 U/L   AST 26  0 - 37 U/L   ALT 32  0 - 53 U/L   Total Protein 7.1  6.0 - 8.3 g/dL   Albumin 4.3  3.5 - 5.2 g/dL   Calcium 9.7  8.4 - 40.9 mg/dL  POCT URINALYSIS DIPSTICK      Result Value Range   Color, UA yellow     Clarity, UA clear     Glucose, UA neg     Bilirubin, UA neg     Ketones, UA neg     Spec Grav, UA 1.025     Blood, UA neg     pH, UA 6.0     Protein, UA neg     Urobilinogen, UA 0.2     Nitrite, UA neg     Leukocytes, UA Negative    POCT UA - MICROSCOPIC ONLY      Result Value Range   WBC, Ur, HPF, POC 0-1     RBC, urine, microscopic neg     Bacteria, U Microscopic neg     Mucus, UA neg     Epithelial cells, urine per micros neg     Crystals, Ur, HPF, POC neg     Casts, Ur, LPF, POC neg     Yeast, UA neg    POCT CBC      Result Value Range   WBC 10.2  4.6 - 10.2 K/uL   Lymph, poc 3.8 (*) 0.6 - 3.4   POC LYMPH PERCENT 37.4  10 - 50 %L   MID  (cbc) 0.7  0 - 0.9   POC MID % 6.7  0 - 12 %M   POC Granulocyte 5.7  2 - 6.9   Granulocyte percent 55.9  37 - 80 %G   RBC 4.45 (*) 4.69 - 6.13 M/uL   Hemoglobin 15.0  14.1 - 18.1 g/dL   HCT, POC 81.1  91.4 - 53.7 %   MCV 105.0 (*) 80 - 97 fL   MCH, POC 33.7 (*) 27 - 31.2 pg   MCHC 32.1  31.8 - 35.4 g/dL  RDW, POC 12.3     Platelet Count, POC 297  142 - 424 K/uL   MPV 9.7  0 - 99.8 fL   Assessment:  Inner ear problem most likely combined with stress  Plan:  Start Flonase If symptoms persist,  ENT consultation  Signed, Elvina Sidle, MD

## 2013-02-10 ENCOUNTER — Ambulatory Visit (INDEPENDENT_AMBULATORY_CARE_PROVIDER_SITE_OTHER): Payer: 59 | Admitting: Family Medicine

## 2013-02-10 VITALS — BP 142/94 | HR 90 | Temp 98.0°F | Resp 18

## 2013-02-10 DIAGNOSIS — E291 Testicular hypofunction: Secondary | ICD-10-CM

## 2013-02-10 DIAGNOSIS — R7989 Other specified abnormal findings of blood chemistry: Secondary | ICD-10-CM

## 2013-02-10 DIAGNOSIS — N529 Male erectile dysfunction, unspecified: Secondary | ICD-10-CM

## 2013-02-10 MED ORDER — TESTOSTERONE CYPIONATE 100 MG/ML IM SOLN
200.0000 mg | Freq: Once | INTRAMUSCULAR | Status: AC
  Administered 2013-02-10: 200 mg via INTRAMUSCULAR

## 2013-02-10 NOTE — Progress Notes (Signed)
[facility]:  Office Visit  Chief Complaint:  Chief Complaint  Patient presents with  . other    wants to discuss options for testosterone    HPI: Dylan Washington is a 52 y.o. male who complains of : Thinks his copay for testosterone injections are too high Would like to discuss Testosterone  Injection options Would like to consider gel We d/w him him SEs He has been feeling better. He does not know if testosterone has helped or the fact that he is consciously trying to eat healthier, change jobs, using CPAP and gettingmore sleep and getting  on the right track  He has Sxs low energy, erection problems which was more pronounced before testosterone but is still present. So does not know if truly helping. .     Past Medical History  Diagnosis Date  . Hypertension   . Sleep apnea   . Asthma   . Allergy     allergy shots at [facility]   Past Surgical History  Procedure Laterality Date  . Achilles tendon repair     History   Social History  . Marital Status: Single    Spouse Name: N/A    Number of Children: N/A  . Years of Education: N/A   Social History Main Topics  . Smoking status: Former Smoker    Quit date: 06/08/2011  . Smokeless tobacco: None  . Alcohol Use: No  . Drug Use: No  . Sexually Active: Yes   Other Topics Concern  . None   Social History Narrative   Marital status: married;       Children: none      Lives: with wife.      Employment: works at [employer]; Solicitor.      Tobacco: none      Alcohol: none      Drugs: none      Exercise: gym 4-5 days per week.   Family History  Problem Relation Age of Onset  . Hypertension Mother   . Cancer Mother   . Hypertension Father    No Known Allergies Prior to Admission medications   Medication Sig Start Date End Date Taking? Authorizing Provider  amLODipine (NORVASC) 10 MG tablet Take 1 tablet (10 mg total) by mouth daily. 02/18/12  Yes Shade Flood, MD  cetirizine  (ZYRTEC) 10 MG tablet Take 10 mg by mouth daily.   Yes Historical Provider, MD  fluticasone (FLONASE) 50 MCG/ACT nasal spray Place 2 sprays into the nose daily. 01/23/13 01/23/14 Yes Elvina Sidle, MD  losartan-hydrochlorothiazide (HYZAAR) 100-25 MG per tablet Take 1 tablet by mouth daily. 02/18/12  Yes Shade Flood, MD  Multiple Vitamins-Minerals (ADEKS) chewable tablet Chew 1 tablet by mouth daily.   Yes Historical Provider, MD  meclizine (ANTIVERT) 25 MG tablet Take 1 tablet (25 mg total) by mouth 3 (three) times daily as needed. 01/08/13   Elvina Sidle, MD  sildenafil (VIAGRA) 100 MG tablet Take 0.5-1 tablets (50-100 mg total) by mouth daily as needed for erectile dysfunction. 01/23/13   Elvina Sidle, MD     ROS: The patient denies fevers, chills, night sweats, unintentional weight loss, chest pain, palpitations, wheezing, dyspnea on exertion, nausea, vomiting, abdominal pain, dysuria, hematuria, melena, numbness, weakness, or tingling.   All other systems have been reviewed and were otherwise negative with the exception of those mentioned in the HPI and as above.    PHYSICAL EXAM: Filed Vitals:   02/10/13 1702  BP: 142/94  Pulse: 90  Temp: 98 F (36.7 C)  Resp: 18   There were no vitals filed for this visit. There is no weight on file to calculate BMI.  General: Alert, no acute distress HEENT:  Normocephalic, atraumatic, oropharynx patent.  Cardiovascular:  Regular rate and rhythm, no rubs murmurs or gallops.  No Carotid bruits, radial pulse intact. No pedal edema.  Respiratory: Clear to auscultation bilaterally.  No wheezes, rales, or rhonchi.  No cyanosis, no use of accessory musculature GI: No organomegaly, abdomen is soft and non-tender, positive bowel sounds.  No masses. Skin: No rashes. Neurologic: Facial musculature symmetric. Psychiatric: Patient is appropriate throughout our interaction. Lymphatic: No cervical lymphadenopathy Musculoskeletal: Gait  intact.   LABS: Results for orders placed in visit on 09/13/12  TSH      Result Value Range   TSH 1.988  0.350 - 4.500 uIU/mL  TESTOSTERONE, FREE, TOTAL      Result Value Range   Testosterone 280 (*) 300 - 890 ng/dL   Sex Hormone Binding 26  13 - 71 nmol/L   Testosterone, Free 62.4  47.0 - 244.0 pg/mL   Testosterone-% Freee. 2.2  1.6 - 2.9 %  COMPREHENSIVE METABOLIC PANEL      Result Value Range   Sodium 139  135 - 145 mEq/L   Potassium 3.9  3.5 - 5.3 mEq/L   Chloride 103  96 - 112 mEq/L   CO2 27  19 - 32 mEq/L   Glucose, Bld 97  70 - 99 mg/dL   BUN 16  6 - 23 mg/dL   Creat 1.61  0.96 - 0.45 mg/dL   Total Bilirubin 0.8  0.3 - 1.2 mg/dL   Alkaline Phosphatase 56  39 - 117 U/L   AST 26  0 - 37 U/L   ALT 32  0 - 53 U/L   Total Protein 7.1  6.0 - 8.3 g/dL   Albumin 4.3  3.5 - 5.2 g/dL   Calcium 9.7  8.4 - 40.9 mg/dL  POCT URINALYSIS DIPSTICK      Result Value Range   Color, UA yellow     Clarity, UA clear     Glucose, UA neg     Bilirubin, UA neg     Ketones, UA neg     Spec Grav, UA 1.025     Blood, UA neg     pH, UA 6.0     Protein, UA neg     Urobilinogen, UA 0.2     Nitrite, UA neg     Leukocytes, UA Negative    POCT UA - MICROSCOPIC ONLY      Result Value Range   WBC, Ur, HPF, POC 0-1     RBC, urine, microscopic neg     Bacteria, U Microscopic neg     Mucus, UA neg     Epithelial cells, urine per micros neg     Crystals, Ur, HPF, POC neg     Casts, Ur, LPF, POC neg     Yeast, UA neg    POCT CBC      Result Value Range   WBC 10.2  4.6 - 10.2 K/uL   Lymph, poc 3.8 (*) 0.6 - 3.4   POC LYMPH PERCENT 37.4  10 - 50 %L   MID (cbc) 0.7  0 - 0.9   POC MID % 6.7  0 - 12 %M   POC Granulocyte 5.7  2 - 6.9   Granulocyte percent 55.9  37 - 80 %  G   RBC 4.45 (*) 4.69 - 6.13 M/uL   Hemoglobin 15.0  14.1 - 18.1 g/dL   HCT, POC 16.1  09.6 - 53.7 %   MCV 105.0 (*) 80 - 97 fL   MCH, POC 33.7 (*) 27 - 31.2 pg   MCHC 32.1  31.8 - 35.4 g/dL   RDW, POC 04.5     Platelet  Count, POC 297  142 - 424 K/uL   MPV 9.7  0 - 99.8 fL     EKG/XRAY:   Primary read interpreted by Dr. Conley Rolls at [facility].   ASSESSMENT/PLAN: Encounter Diagnoses  Name Primary?  . Erectile dysfunction Yes  . Low testosterone    We had a lengthy discussion about testosterone. His levels were normal when Dr Georgiana Shore saw him and repeat levels were drawn  and he was barely below normal on 09/2011 when testosterone was drawn at 8 pm.  He came in after that and complained of low testosterone level and erectile dysfunction and was put on testosterone. I explained to him that he needs to get an accurate testosterone level and the best time is at 8 am or at least before 10 Am. He would need to be off of his testosterone for 1 month before we can do this.  The testosterone injections may/may not be helping him feel better but he is afraid to stop today because he has been doing better, I think he is doing better because he is exercising, sleeping more, changed his job which was causing him to have allergies, and also using his CPAP.   Risks and benefits explained to him about testosterone use without dx of hypogonadism He will come back in 2 months to get his testosterone rechecked at 8 AM.  No charge for visit, charge for testosterone injection only.    Hamilton Capri PHUONG, DO 02/10/2013 5:52 PM

## 2013-03-17 ENCOUNTER — Ambulatory Visit (INDEPENDENT_AMBULATORY_CARE_PROVIDER_SITE_OTHER): Payer: 59 | Admitting: Family Medicine

## 2013-03-17 VITALS — BP 128/86 | HR 79 | Temp 98.2°F | Resp 18 | Ht 71.0 in | Wt 258.0 lb

## 2013-03-17 DIAGNOSIS — G4700 Insomnia, unspecified: Secondary | ICD-10-CM

## 2013-03-17 DIAGNOSIS — J309 Allergic rhinitis, unspecified: Secondary | ICD-10-CM

## 2013-03-17 DIAGNOSIS — I10 Essential (primary) hypertension: Secondary | ICD-10-CM

## 2013-03-17 DIAGNOSIS — N529 Male erectile dysfunction, unspecified: Secondary | ICD-10-CM

## 2013-03-17 MED ORDER — CLONAZEPAM 0.5 MG PO TABS: 0.5000 mg | ORAL_TABLET | Freq: Two times a day (BID) | ORAL | Status: DC | PRN

## 2013-03-17 MED ORDER — LOSARTAN POTASSIUM-HCTZ 100-25 MG PO TABS: 1.0000 | ORAL_TABLET | Freq: Every day | ORAL | Status: DC

## 2013-03-17 MED ORDER — AMLODIPINE BESYLATE 5 MG PO TABS: 5.0000 mg | ORAL_TABLET | Freq: Every day | ORAL | Status: DC

## 2013-03-17 MED ORDER — CETIRIZINE HCL 10 MG PO TABS: 10.0000 mg | ORAL_TABLET | Freq: Every day | ORAL | Status: DC

## 2013-03-17 MED ORDER — SILDENAFIL CITRATE 100 MG PO TABS: 50.0000 mg | ORAL_TABLET | Freq: Every day | ORAL | Status: DC | PRN

## 2013-03-17 MED ORDER — AMLODIPINE BESYLATE 10 MG PO TABS: 10.0000 mg | ORAL_TABLET | Freq: Every day | ORAL | Status: DC

## 2013-03-17 NOTE — Progress Notes (Signed)
Patient ID: Khylin Gutridge MRN: [id number], DOB: 1961/06/25, 52 y.o. Date of Encounter: 03/17/2013, 4:56 PM  Primary Physician: Elvina Sidle, MD  Chief Complaint: HTN  HPI: 52 y.o. year old male with history below presents for hypertension follow up.   Recently saw friend murdered in [place].  Not sleeping well.  Working out daily.  No CP, HA, visual changes, or focal deficits.   Past Medical History  Diagnosis Date  . Hypertension   . Sleep apnea   . Asthma   . Allergy     allergy shots at [facility]     Home Meds: Prior to Admission medications   Medication Sig Start Date End Date Taking? Authorizing Provider  amLODipine (NORVASC) 10 MG tablet Take 1 tablet (10 mg total) by mouth daily. 02/18/12  Yes Shade Flood, MD  cetirizine (ZYRTEC) 10 MG tablet Take 10 mg by mouth daily.   Yes Historical Provider, MD  losartan-hydrochlorothiazide (HYZAAR) 100-25 MG per tablet Take 1 tablet by mouth daily. 02/18/12  Yes Shade Flood, MD  meclizine (ANTIVERT) 25 MG tablet Take 1 tablet (25 mg total) by mouth 3 (three) times daily as needed. 01/08/13  Yes Elvina Sidle, MD  Multiple Vitamins-Minerals (ADEKS) chewable tablet Chew 1 tablet by mouth daily.   Yes Historical Provider, MD  sildenafil (VIAGRA) 100 MG tablet Take 0.5-1 tablets (50-100 mg total) by mouth daily as needed for erectile dysfunction. 01/23/13  Yes Elvina Sidle, MD  fluticasone [facility]) 50 MCG/ACT nasal spray Place 2 sprays into the nose daily. 01/23/13 01/23/14  Elvina Sidle, MD    Allergies: No Known Allergies  History   Social History  . Marital Status: Single    Spouse Name: N/A    Number of Children: N/A  . Years of Education: N/A   Occupational History  . Not on file.   Social History Main Topics  . Smoking status: Former Smoker    Quit date: 06/08/2011  . Smokeless tobacco: Not on file  . Alcohol Use: No  . Drug Use: No  . Sexual Activity: Yes   Other Topics Concern  . Not on file     Social History Narrative   Marital status: married;       Children: none      Lives: with wife.      Employment: works at [employer]; Solicitor.      Tobacco: none      Alcohol: none      Drugs: none      Exercise: gym 4-5 days per week.     Family History  Problem Relation Age of Onset  . Hypertension Mother   . Cancer Mother   . Hypertension Father     Review of Systems: Constitutional: negative for chills, fever, night sweats, weight changes, or fatigue  HEENT: negative for vision changes, hearing loss, congestion, rhinorrhea, ST, epistaxis, or sinus pressure Cardiovascular: negative for chest pain, palpitations, or DOE Respiratory: negative for hemoptysis, wheezing, shortness of breath, or cough Abdominal: negative for abdominal pain, nausea, vomiting, diarrhea, or constipation Dermatological: negative for rash Neurologic: negative for headache, dizziness, or syncope All other systems reviewed and are otherwise negative with the exception to those above and in the HPI.   Physical Exam: Blood pressure 128/86, pulse 79, temperature 98.2 F (36.8 C), temperature source Oral, resp. rate 18, height 5\' 11"  (1.803 m), weight 258 lb (117.028 kg), SpO2 99.00%., Body mass index is 36 kg/(m^2). General: Well developed, well nourished, in no acute distress.  Head: Normocephalic, atraumatic, eyes without discharge, sclera non-icteric, nares are without discharge. Bilateral auditory canals clear, TM's are without perforation, pearly grey and translucent with reflective cone of light bilaterally. Oral cavity moist, posterior pharynx without exudate, erythema, peritonsillar abscess, or post nasal drip.  Neck: Supple. No thyromegaly. Full ROM. No lymphadenopathy. No carotid bruits. Lungs: Clear bilaterally to auscultation without wheezes, rales, or rhonchi. Breathing is unlabored. Heart: RRR with S1 S2. No murmurs, rubs, or gallops appreciated.  Abdomen: Soft, non-tender, non-distended  with normoactive bowel sounds. No hepatosplenomegaly. No rebound/guarding. No obvious abdominal masses. Msk:  Strength and tone normal for age. Extremities/Skin: Warm and dry. No clubbing or cyanosis. No edema. No rashes or suspicious lesions. Distal pulses 2+ and equal bilaterally. Neuro: Alert and oriented X 3. Moves all extremities spontaneously. Gait is normal. CNII-XII grossly in tact. DTR 2+, cerebellar function intact. Rhomberg normal. Psych:  Responds to questions appropriately with a normal affect.   ASSESSMENT AND PLAN:  52 y.o. year old male with  -  Signed, Elvina Sidle, MD 03/17/2013 4:56 PM

## 2013-10-07 ENCOUNTER — Ambulatory Visit (INDEPENDENT_AMBULATORY_CARE_PROVIDER_SITE_OTHER): Payer: BC Managed Care – PPO | Admitting: Family Medicine

## 2013-10-07 VITALS — BP 144/104 | HR 82 | Temp 99.2°F | Resp 16 | Ht 70.5 in | Wt 246.0 lb

## 2013-10-07 DIAGNOSIS — G4700 Insomnia, unspecified: Secondary | ICD-10-CM

## 2013-10-07 DIAGNOSIS — J9801 Acute bronchospasm: Secondary | ICD-10-CM

## 2013-10-07 DIAGNOSIS — J329 Chronic sinusitis, unspecified: Secondary | ICD-10-CM

## 2013-10-07 DIAGNOSIS — I10 Essential (primary) hypertension: Secondary | ICD-10-CM

## 2013-10-07 MED ORDER — METHYLPREDNISOLONE ACETATE 80 MG/ML IJ SUSP
80.0000 mg | Freq: Once | INTRAMUSCULAR | Status: AC
  Administered 2013-10-07: 80 mg via INTRAMUSCULAR

## 2013-10-07 MED ORDER — AMLODIPINE BESYLATE 5 MG PO TABS: 5.0000 mg | ORAL_TABLET | Freq: Every day | ORAL | Status: DC

## 2013-10-07 MED ORDER — LOSARTAN POTASSIUM-HCTZ 100-25 MG PO TABS: 1.0000 | ORAL_TABLET | Freq: Every day | ORAL | Status: DC

## 2013-10-07 MED ORDER — CLONAZEPAM 0.5 MG PO TABS: 0.5000 mg | ORAL_TABLET | Freq: Two times a day (BID) | ORAL | Status: DC | PRN

## 2013-10-07 MED ORDER — AMOXICILLIN 500 MG PO CAPS: 1000.0000 mg | ORAL_CAPSULE | Freq: Two times a day (BID) | ORAL | Status: AC

## 2013-10-07 NOTE — Progress Notes (Signed)
Patient ID: Dylan Washington MRN: [id number], DOB: Dec 27, 1960, 53 y.o. Date of Encounter: 10/07/2013, 10:53 AM  Primary Physician: Elvina Sidle, MD  Chief Complaint:  Chief Complaint  Patient presents with  . Sinus Problem    x 2 days   . Medication Refill    amlodipine    HPI: 53 y.o. year old male presents with 7 day history of nasal congestion, post nasal drip, sore throat, sinus pressure, and cough. Afebrile. No chills. Nasal congestion thick and green/yellow. Sinus pressure is the worst symptom. Cough is productive secondary to post nasal drip and not associated with time of day. Ears feel full, leading to sensation of muffled hearing. Has tried OTC cold preps without success. No GI complaints.   Patient was born with left facial weakness. He's had recurrent sinus infections since. He went to see the ear nose and throat at one point but they were unable to pass the scope through the left side. Now he is unable to breathe on the left side and has headaches over the left frontal region.  Patient recently traveled to [place] because his mom is dying of cancer. To bear her recently. He has go back periodically over the next month to see to her affairs.  No recent antibiotics, recent travels, or sick contacts   No leg trauma, sedentary periods, h/o cancer, or tobacco use.  Past Medical History  Diagnosis Date  . Hypertension   . Sleep apnea   . Asthma   . Allergy     allergy shots at [facility]     Home Meds: Prior to Admission medications   Medication Sig Start Date End Date Taking? Authorizing Provider  amLODipine (NORVASC) 5 MG tablet Take 1 tablet (5 mg total) by mouth daily. 10/07/13  Yes Elvina Sidle, MD  losartan-hydrochlorothiazide (HYZAAR) 100-25 MG per tablet Take 1 tablet by mouth daily. 10/07/13  Yes Elvina Sidle, MD  Multiple Vitamins-Minerals (ADEKS) chewable tablet Chew 1 tablet by mouth daily.   Yes Historical Provider, MD  amoxicillin (AMOXIL)  500 MG capsule Take 2 capsules (1,000 mg total) by mouth 2 (two) times daily. 10/07/13 10/17/13  Elvina Sidle, MD  cetirizine (ZYRTEC) 10 MG tablet Take 1 tablet (10 mg total) by mouth daily. 03/17/13   Elvina Sidle, MD  clonazePAM (KLONOPIN) 0.5 MG tablet Take 1 tablet (0.5 mg total) by mouth 2 (two) times daily as needed for anxiety. 10/07/13   Elvina Sidle, MD  sildenafil (VIAGRA) 100 MG tablet Take 0.5-1 tablets (50-100 mg total) by mouth daily as needed for erectile dysfunction. 03/17/13   Elvina Sidle, MD    Allergies: No Known Allergies  History   Social History  . Marital Status: Single    Spouse Name: N/A    Number of Children: N/A  . Years of Education: N/A   Occupational History  . Not on file.   Social History Main Topics  . Smoking status: Former Smoker    Quit date: 06/08/2011  . Smokeless tobacco: Not on file  . Alcohol Use: No  . Drug Use: No  . Sexual Activity: Yes   Other Topics Concern  . Not on file   Social History Narrative   Marital status: married;       Children: none      Lives: with wife.      Employment: works at [employer]; Solicitor.      Tobacco: none      Alcohol: none      Drugs:  none      Exercise: gym 4-5 days per week.     Review of Systems: Constitutional: negative for chills, fever, night sweats or weight changes Cardiovascular: negative for chest pain or palpitations Respiratory: negative for hemoptysis, wheezing, or shortness of breath Abdominal: negative for abdominal pain, nausea, vomiting or diarrhea Dermatological: negative for rash Neurologic: negative for headache   Physical Exam: Blood pressure 144/104, pulse 82, temperature 99.2 F (37.3 C), temperature source Oral, resp. rate 16, height 5' 10.5" (1.791 m), weight 246 lb (111.585 kg), SpO2 97.00%., Body mass index is 34.79 kg/(m^2). General: Well developed, well nourished, in no acute distress. Head: Normocephalic, atraumatic, eyes without discharge, sclera  non-icteric, nares are congested. Bilateral auditory canals clear, TM's are without perforation, pearly grey with reflective cone of light bilaterally. Serous effusion bilaterally behind TM's. Maxillary sinus TTP. Oral cavity moist, dentition normal. Posterior pharynx with post nasal drip and mild erythema. No peritonsillar abscess or tonsillar exudate. Marked left-sided nasal swelling Neck: Supple. No thyromegaly. Full ROM. No lymphadenopathy. Lungs: Clear bilaterally to auscultation without wheezes, rales, or rhonchi. Breathing is unlabored.  Heart: RRR with S1 S2. No murmurs, rubs, or gallops appreciated. Msk:  Strength and tone normal for age. Extremities: No clubbing or cyanosis. No edema. Neuro: Alert and oriented X 3. Moves all extremities spontaneously. CNII-XII grossly in tact.  Left facial weakness Psych:  Responds to questions appropriately with a normal affect.     ASSESSMENT AND PLAN:  53 y.o. year old male with sinusitis -Insomnia - Plan: clonazePAM (KLONOPIN) 0.5 MG tablet  Hypertension - Plan: losartan-hydrochlorothiazide (HYZAAR) 100-25 MG per tablet, amLODipine (NORVASC) 5 MG tablet  Cough due to bronchospasm - Plan: amoxicillin (AMOXIL) 500 MG capsule  Sinusitis - Plan: amoxicillin (AMOXIL) 500 MG capsule, Ambulatory referral to ENT  Signed, Elvina SidleKurt Rahmon Heigl, MD    -Tylenol/Motrin prn -Rest/fluids -RTC precautions -RTC 3-5 days if no improvement  Signed, Elvina SidleKurt Roselina Burgueno, MD 10/07/2013 10:53 AM

## 2013-10-07 NOTE — Patient Instructions (Addendum)
Sinusitis  Sinusitis is redness, soreness, and swelling (inflammation) of the paranasal sinuses. Paranasal sinuses are air pockets within the bones of your face (beneath the eyes, the middle of the forehead, or above the eyes). In healthy paranasal sinuses, mucus is able to drain out, and air is able to circulate through them by way of your nose. However, when your paranasal sinuses are inflamed, mucus and air can become trapped. This can allow bacteria and other germs to grow and cause infection.  Sinusitis can develop quickly and last only a short time (acute) or continue over a long period (chronic). Sinusitis that lasts for more than 12 weeks is considered chronic.   CAUSES   Causes of sinusitis include:  · Allergies.  · Structural abnormalities, such as displacement of the cartilage that separates your nostrils (deviated septum), which can decrease the air flow through your nose and sinuses and affect sinus drainage.  · Functional abnormalities, such as when the small hairs (cilia) that line your sinuses and help remove mucus do not work properly or are not present.  SYMPTOMS   Symptoms of acute and chronic sinusitis are the same. The primary symptoms are pain and pressure around the affected sinuses. Other symptoms include:  · Upper toothache.  · Earache.  · Headache.  · Bad breath.  · Decreased sense of smell and taste.  · A cough, which worsens when you are lying flat.  · Fatigue.  · Fever.  · Thick drainage from your nose, which often is green and may contain pus (purulent).  · Swelling and warmth over the affected sinuses.  DIAGNOSIS   Your caregiver will perform a physical exam. During the exam, your caregiver may:  · Look in your nose for signs of abnormal growths in your nostrils (nasal polyps).  · Tap over the affected sinus to check for signs of infection.  · View the inside of your sinuses (endoscopy) with a special imaging device with a light attached (endoscope), which is inserted into your  sinuses.  If your caregiver suspects that you have chronic sinusitis, one or more of the following tests may be recommended:  · Allergy tests.  · Nasal culture A sample of mucus is taken from your nose and sent to a lab and screened for bacteria.  · Nasal cytology A sample of mucus is taken from your nose and examined by your caregiver to determine if your sinusitis is related to an allergy.  TREATMENT   Most cases of acute sinusitis are related to a viral infection and will resolve on their own within 10 days. Sometimes medicines are prescribed to help relieve symptoms (pain medicine, decongestants, nasal steroid sprays, or saline sprays).   However, for sinusitis related to a bacterial infection, your caregiver will prescribe antibiotic medicines. These are medicines that will help kill the bacteria causing the infection.   Rarely, sinusitis is caused by a fungal infection. In theses cases, your caregiver will prescribe antifungal medicine.  For some cases of chronic sinusitis, surgery is needed. Generally, these are cases in which sinusitis recurs more than 3 times per year, despite other treatments.  HOME CARE INSTRUCTIONS   · Drink plenty of water. Water helps thin the mucus so your sinuses can drain more easily.  · Use a humidifier.  · Inhale steam 3 to 4 times a day (for example, sit in the bathroom with the shower running).  · Apply a warm, moist washcloth to your face 3 to 4 times a day,   or as directed by your caregiver.  · Use saline nasal sprays to help moisten and clean your sinuses.  · Take over-the-counter or prescription medicines for pain, discomfort, or fever only as directed by your caregiver.  SEEK IMMEDIATE MEDICAL CARE IF:  · You have increasing pain or severe headaches.  · You have nausea, vomiting, or drowsiness.  · You have swelling around your face.  · You have vision problems.  · You have a stiff neck.  · You have difficulty breathing.  MAKE SURE YOU:   · Understand these  instructions.  · Will watch your condition.  · Will get help right away if you are not doing well or get worse.  Document Released: 06/27/2005 Document Revised: 09/19/2011 Document Reviewed: 07/12/2011  ExitCare® Patient Information ©2014 [employer].  Sinusitis  Sinusitis is redness, soreness, and swelling (inflammation) of the paranasal sinuses. Paranasal sinuses are air pockets within the bones of your face (beneath the eyes, the middle of the forehead, or above the eyes). In healthy paranasal sinuses, mucus is able to drain out, and air is able to circulate through them by way of your nose. However, when your paranasal sinuses are inflamed, mucus and air can become trapped. This can allow bacteria and other germs to grow and cause infection.  Sinusitis can develop quickly and last only a short time (acute) or continue over a long period (chronic). Sinusitis that lasts for more than 12 weeks is considered chronic.   CAUSES   Causes of sinusitis include:  · Allergies.  · Structural abnormalities, such as displacement of the cartilage that separates your nostrils (deviated septum), which can decrease the air flow through your nose and sinuses and affect sinus drainage.  · Functional abnormalities, such as when the small hairs (cilia) that line your sinuses and help remove mucus do not work properly or are not present.  SYMPTOMS   Symptoms of acute and chronic sinusitis are the same. The primary symptoms are pain and pressure around the affected sinuses. Other symptoms include:  · Upper toothache.  · Earache.  · Headache.  · Bad breath.  · Decreased sense of smell and taste.  · A cough, which worsens when you are lying flat.  · Fatigue.  · Fever.  · Thick drainage from your nose, which often is green and may contain pus (purulent).  · Swelling and warmth over the affected sinuses.  DIAGNOSIS   Your caregiver will perform a physical exam. During the exam, your caregiver may:  · Look in your nose for signs of  abnormal growths in your nostrils (nasal polyps).  · Tap over the affected sinus to check for signs of infection.  · View the inside of your sinuses (endoscopy) with a special imaging device with a light attached (endoscope), which is inserted into your sinuses.  If your caregiver suspects that you have chronic sinusitis, one or more of the following tests may be recommended:  · Allergy tests.  · Nasal culture A sample of mucus is taken from your nose and sent to a lab and screened for bacteria.  · Nasal cytology A sample of mucus is taken from your nose and examined by your caregiver to determine if your sinusitis is related to an allergy.  TREATMENT   Most cases of acute sinusitis are related to a viral infection and will resolve on their own within 10 days. Sometimes medicines are prescribed to help relieve symptoms (pain medicine, decongestants, nasal steroid sprays, or saline sprays).     However, for sinusitis related to a bacterial infection, your caregiver will prescribe antibiotic medicines. These are medicines that will help kill the bacteria causing the infection.   Rarely, sinusitis is caused by a fungal infection. In theses cases, your caregiver will prescribe antifungal medicine.  For some cases of chronic sinusitis, surgery is needed. Generally, these are cases in which sinusitis recurs more than 3 times per year, despite other treatments.  HOME CARE INSTRUCTIONS   · Drink plenty of water. Water helps thin the mucus so your sinuses can drain more easily.  · Use a humidifier.  · Inhale steam 3 to 4 times a day (for example, sit in the bathroom with the shower running).  · Apply a warm, moist washcloth to your face 3 to 4 times a day, or as directed by your caregiver.  · Use saline nasal sprays to help moisten and clean your sinuses.  · Take over-the-counter or prescription medicines for pain, discomfort, or fever only as directed by your caregiver.  SEEK IMMEDIATE MEDICAL CARE IF:  · You have increasing  pain or severe headaches.  · You have nausea, vomiting, or drowsiness.  · You have swelling around your face.  · You have vision problems.  · You have a stiff neck.  · You have difficulty breathing.  MAKE SURE YOU:   · Understand these instructions.  · Will watch your condition.  · Will get help right away if you are not doing well or get worse.  Document Released: 06/27/2005 Document Revised: 09/19/2011 Document Reviewed: 07/12/2011  ExitCare® Patient Information ©2014 [employer].

## 2013-11-28 ENCOUNTER — Ambulatory Visit: Payer: Self-pay | Admitting: Otolaryngology

## 2014-02-17 ENCOUNTER — Ambulatory Visit (INDEPENDENT_AMBULATORY_CARE_PROVIDER_SITE_OTHER): Payer: BC Managed Care – PPO | Admitting: Internal Medicine

## 2014-02-17 VITALS — BP 160/100 | HR 72 | Temp 98.2°F | Resp 16 | Ht 70.0 in | Wt 259.0 lb

## 2014-02-17 DIAGNOSIS — J3489 Other specified disorders of nose and nasal sinuses: Secondary | ICD-10-CM

## 2014-02-17 DIAGNOSIS — H9202 Otalgia, left ear: Secondary | ICD-10-CM

## 2014-02-17 DIAGNOSIS — H9209 Otalgia, unspecified ear: Secondary | ICD-10-CM

## 2014-02-17 DIAGNOSIS — R0981 Nasal congestion: Secondary | ICD-10-CM

## 2014-02-17 DIAGNOSIS — J018 Other acute sinusitis: Secondary | ICD-10-CM

## 2014-02-17 DIAGNOSIS — J0141 Acute recurrent pansinusitis: Secondary | ICD-10-CM

## 2014-02-17 MED ORDER — LEVOFLOXACIN 500 MG PO TABS: 500.0000 mg | ORAL_TABLET | Freq: Every day | ORAL | Status: DC

## 2014-02-17 MED ORDER — METHYLPREDNISOLONE ACETATE 80 MG/ML IJ SUSP
120.0000 mg | Freq: Once | INTRAMUSCULAR | Status: AC
  Administered 2014-02-17: 120 mg via INTRAMUSCULAR

## 2014-02-17 NOTE — Progress Notes (Signed)
   Subjective:    Patient ID: Dylan Washington, male    DOB: 10-26-1960, 53 y.o.   MRN: [id number]  HPI 53 y/o male complains of ear pain in left ear, Sinus pain no drainage, no cough except AM. No chest pain.  Pt states SOB 8./9 when just moving around house.  Pt states has allergies year round, taking zyrtec, Flonase, mucinex  Had sinus reconstruction in 11/2013.   Feels pain, pressure, congestion in face.  Review of Systems     Objective:   Physical Exam  Vitals reviewed. Constitutional: He is oriented to person, place, and time. He appears well-developed and well-nourished. No distress.  HENT:  Head: Normocephalic.  Right Ear: External ear normal.  Left Ear: External ear normal.  Nose: Mucosal edema, rhinorrhea and sinus tenderness present. Right sinus exhibits no maxillary sinus tenderness and no frontal sinus tenderness. Left sinus exhibits no maxillary sinus tenderness and no frontal sinus tenderness.  Mouth/Throat: Oropharynx is clear and moist.  Eyes: EOM are normal. Pupils are equal, round, and reactive to light.  Neck: Normal range of motion.  Cardiovascular: Normal rate.   Pulmonary/Chest: Effort normal and breath sounds normal.  Musculoskeletal: Normal range of motion.  Neurological: He is alert and oriented to person, place, and time. No cranial nerve deficit. Coordination normal.  Psychiatric: He has a normal mood and affect. His behavior is normal. Thought content normal.          Assessment & Plan:  Sinusitis Allergy  Levaquin 500mg /Depomedrol 120mg  IM

## 2014-02-17 NOTE — Patient Instructions (Signed)

## 2014-02-18 ENCOUNTER — Telehealth: Payer: Self-pay

## 2014-02-18 NOTE — Telephone Encounter (Signed)
PT WAS SEEN FOR NASAL CONGESTION AND WOULD LIKE AN OOW NOTE FOR THE REST OF THE WEEK. PLEASE CALL [phone] AND PT WILL COME PICK UP

## 2014-02-18 NOTE — Telephone Encounter (Signed)
Spoke to pt, a advised him i could write him out for yesterday and today, but that would be the only days without sending this for approval. He stated that would be fine, because he is off work Thursday and Friday, that should give him enough time to feel better.  OOW letter waiting p/u

## 2014-04-02 ENCOUNTER — Ambulatory Visit (INDEPENDENT_AMBULATORY_CARE_PROVIDER_SITE_OTHER): Payer: BC Managed Care – PPO | Admitting: Family Medicine

## 2014-04-02 ENCOUNTER — Ambulatory Visit (INDEPENDENT_AMBULATORY_CARE_PROVIDER_SITE_OTHER): Payer: BC Managed Care – PPO

## 2014-04-02 VITALS — BP 138/94 | HR 70 | Temp 98.8°F | Resp 16 | Ht 71.25 in | Wt 256.2 lb

## 2014-04-02 DIAGNOSIS — R0602 Shortness of breath: Secondary | ICD-10-CM

## 2014-04-02 DIAGNOSIS — I4510 Unspecified right bundle-branch block: Secondary | ICD-10-CM | POA: Insufficient documentation

## 2014-04-02 DIAGNOSIS — Z9109 Other allergy status, other than to drugs and biological substances: Secondary | ICD-10-CM

## 2014-04-02 DIAGNOSIS — I10 Essential (primary) hypertension: Secondary | ICD-10-CM

## 2014-04-02 DIAGNOSIS — J321 Chronic frontal sinusitis: Secondary | ICD-10-CM | POA: Insufficient documentation

## 2014-04-02 NOTE — Progress Notes (Addendum)
Dylan Washington - 53 y.o. male MRN [id number]  Date of birth: October 24, 1960  SUBJECTIVE:  Including CC & ROS.  URI HPI: Patient's a 53 year old African American male with a long-standing history of chronic sinusitis. Previously been treated multiple times in the past with antibiotic therapy and also had sinus surgery in March 2015. He's also has a long history of treatment for chronic allergies radius seen an allergist and ENT and has been on allergy shots in the past. He presented today complaining of persistent sinus symptoms however the differences in his symptoms over the last few days is increased fatigue, dyspnea on exertion, and decreased exercise tolerance running only one mild today when typically run 2 miles.  Onset of symptoms:  3 week ago right after completing a course on Levaquin in August 2015.  Symptoms include: yes Nasal congestion, no nasal drainage , color of drainage is none, no sore throat, no fullness in the ears , yes sinus pressure, no headache, no fever, no chills, no bodyache, no cough, no mucous, yes SOB worse with exercise going up stairs. yes history of tobacco use 5-6 cigarette 20 years , no history of asthma but treated with Symibcort, no history of COPD. Denies Nausea, vomiting, diarrhea.  Appetite normal, and Drinking fluids Relieving factors: zyrtec, Flonase, nasal wash, allergic treat next week and sinus surgery 09/2013  Symptoms not improving but no worse  ROS:  Constitutional:  No fever, chills, or fatigue.  Respiratory:  yes shortness of breath, no cough, no wheezing Cardiovascular:  No palpitations, nochest pain or no syncope Gastrointestinal:  No nausea, no abdominal pain Review of systems otherwise negative except for what is stated in HPI  HISTORY: Past Medical, Surgical, Social, and Family History Reviewed & Updated per EMR. Pertinent Historical Findings include: Cardiac risk factors include male, hypertension poorly controlled, overweight, BMI of 35, no family  history of heart disease, former tobacco abuser  PHYSICAL EXAM:  VS: BP:138/94 mmHg  HR:70bpm  TEMP:98.8 F (37.1 C)(Oral)  RESP:99 %  HT:5' 11.25" (181 cm)   WT:256 lb 3.2 oz (116.212 kg)  BMI:35.6 PHYSICAL EXAM: General:  Alert and oriented, No acute distress.   HENT:  Normocephalic, Oral mucosa is moist. URI ENT: Eyes are equal and reactive to light, normal conjunctivae mild redness no itching, normal hearing, mucous membrane is moist, no erythema, no exudate.  Bilateral ears with no fluid bulging and no erythema, TMs are intact.  Both nasal passages are inflamed, erythematous, with very mild clear drainage and inflamed turbinate.  Sinus passages are not tender to palpation.  Respiratory:  Lungs are clear to auscultation, Respirations are non-labored, Symmetrical chest wall expansion.   Cardiovascular:  Normal rate, Regular rhythm, No murmur, Good pulses equal in all extremities, No edema.   Gastrointestinal:  Soft, Non-tender, Non-distended, Normal bowel sounds, No organomegaly.   Integumentary:  Warm, Dry, No rash.   Neurologic:  Alert, Oriented, No focal defects Psychiatric:  Cooperative, Appropriate mood & affect.    CXR: No acute cardiopulmonary process, no consolidation no effusion  EKG:EKG revealed a right bundle-branch block and right axis deviation, normal rate normal QT interval. Unable compared to previous EKG  ASSESSMENT & PLAN:  Symptoms of chronic sinusitis similar to previous symptoms that have been present intermittently for months. However he also reported new symptoms of shortness of breath and decreased exercise tolerance. Although I think the patient does have his persistent underlying chronic sinusitis and more so chronic allergic rhinitis he is currently on appropriate treatment with  Zyrtec, Flonase and nasal saline washes. Also He has plans to see allergist next week for initiating allergy treatments. I do not believe that he warns antibiotic therapy at this time  nor to I recommend antibiotics given is new symtpoms. Further ever given his new onset of shortness of breath and decreased exercise tolerance and risk factors of hypertension, 53, tobacco abuse I recommended EKG and chest x-ray. EKG revealed a right bundle-branch block and right axis deviation, normal rate normal QT interval. Unable compared to previous EKG. CXR unremarkable for acute cardiopulmonary disease. Recommended referral to cardiology within the week. Patient given precautions for MI. Recommended starting daily ASA 81 mg daily.

## 2014-04-02 NOTE — Patient Instructions (Signed)
Myocardial Infarction A myocardial infarction (MI) is damage to the heart that is not reversible. It is also called a heart attack. An MI usually occurs when a heart (coronary) artery becomes blocked or narrowed. This cuts off the blood supply to the heart. When one or more of the heart (coronary) arteries becomes blocked, that area of the heart begins to die. This causes pain felt during an MI.  If you think you might be having an MI, call your local emergency services immediately (911 in [place] It is recommended that you chew and swallow 3 non-enteric coated baby aspirin if you do not have an aspirin allergy. Do not drive yourself to the hospital or wait to see if your symptoms go away. The sooner MI is treated, the greater the amount of heart muscle saved. Time is muscle. It can save your life. CAUSES  An MI can occur from:  A gradual buildup of a fatty substance called plaque. When plaque builds up in the arteries, this condition is called atherosclerosis. This buildup can block or reduce the blood supply to the heart artery(s).  A sudden plaque rupture within a heart artery that causes a blood clot (thrombus). A blood clot can block the heart artery which does not allow blood flow to the heart.  A severe tightening (spasm) of the heart artery. This is a less common cause of a heart attack. When a heart artery spasms, it cuts off blood flow through the artery. Spasms can occur in heart arteries that do not have atherosclerosis. SYMPTOMS  MI symptoms can vary, such as:  In both men and women, MI symptoms can include the following:  Chest pain. The chest pain may feel like a crushing, squeezing, or "pressure" type feeling. MI pain can be "referred," meaning pain can be caused in one part of the body but felt in another part of the body. Referred MI pain may occur in the left arm, neck, or jaw. Pain may even be felt in the right arm.  Shortness of breath (dyspnea).  Heartburn or indigestion with  or without vomiting, shortness of breath, or sweating (diaphoresis).  Sudden, cold sweats.  Sudden lightheadedness.  Upper back pain.  Women can have unique MI symptoms, such as:  Unexplained feelings of nervousness or anxiety.  Discomfort between the shoulder blades (scapula) or upper back.  Tingling in the hands and arms.  In elderly people (regardless of gender), MI symptoms can be subtle, such as:  Sweating (diaphoresis).  Shortness of breath (dyspnea).  General tiredness (fatigue) or not feeling well (malaise). RISK FACTORS People at risk for an MI usually have one or more risk factors, such as:  High blood pressure.  High cholesterol.  Smoking.  Gender. Men have a higher heart attack risk.  Overweight/obesity.  Age.  Family history.  Lack of exercise.  Diabetes.  Stress.  Excessive alcohol use.  Street drug use (cocaine and methamphetamines). DIAGNOSIS  Diagnosis of an MI involves several tests such as:  An assessment of your vital signs such as heart rhythm, blood pressure, respiratory rate, and oxygen level.  An EKG (ECG) to look at the electrical activity of your heart.  Blood tests called cardiac markers are drawn at scheduled times to measure proteins or enzymes released by the damaged heart muscle.  A chest X-ray.  An echocardiogram to evaluate heart motion and blood flow.  Coronary angiography (cardiac catheterization). This is a diagnostic procedure to look at the heart arteries.

## 2014-04-11 ENCOUNTER — Institutional Professional Consult (permissible substitution): Payer: BC Managed Care – PPO | Admitting: Neurology

## 2014-04-18 ENCOUNTER — Ambulatory Visit (INDEPENDENT_AMBULATORY_CARE_PROVIDER_SITE_OTHER): Payer: BC Managed Care – PPO | Admitting: Neurology

## 2014-04-18 ENCOUNTER — Encounter: Payer: Self-pay | Admitting: Neurology

## 2014-04-18 VITALS — BP 151/97 | HR 68 | Resp 17 | Ht 71.0 in | Wt 262.0 lb

## 2014-04-18 DIAGNOSIS — E669 Obesity, unspecified: Secondary | ICD-10-CM

## 2014-04-18 DIAGNOSIS — G4733 Obstructive sleep apnea (adult) (pediatric): Secondary | ICD-10-CM

## 2014-04-18 DIAGNOSIS — Z9989 Dependence on other enabling machines and devices: Secondary | ICD-10-CM

## 2014-04-18 NOTE — Progress Notes (Signed)
SLEEP MED[facility]INE CLINIC   Provider:  Melvyn Novasarmen  Roniqua Kintz, M D  Referring Provider:  Yates DecampJay Ganji, MD - cardiology   Primary Care Physician:  Elvina SidleLAUENSTEIN,KURT, MD  Chief Complaint  Patient presents with  . Sleep Apnea, Fatigue    NP, paper referral, Rm 11    HPI:  Dylan RockersMarty Allerton is a 53 y.o. male , who is seen here as a referral from Drs. Ganji and  Lauenstein for a sleep evaluation.  Dear Dr. Jacinto HalimGanji,  Thank you for allowing me to see a long time patient after 8 years! This patient suffers from chronic sinusitis, OSA, obesity, HTN  Mr. Vonita Mosseterson presents with his Escape CPAP machine , which he has used over 7 years at the same setting,10 cm water with 2 cm EPR  and average daily use of 5 hours and 4 minutes. 66 days out of 69 days.  The patient continues to working the [employer], a veteran of  27 years, not an office, he works from 7 AM to 3:30 PM. 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 He is exposed to dust. The facility is windowless, he  is not exposed to natural day light.  His sleep habits are as follows; He goes to bed at 10.30 PM, until 11.00 he will watch TV , but  He feels this helps him to fall asleep, keeping his mind off problems. He rises at 5.30 , with an alarm-  He has 2 nocturia breaks nightly. When waking up, he has a dry mouth inspite of the humidifier. He has a septal deviation, making his  breathing with CPAP at times difficult,but has no head aches aside form sinus problems.  In May  2015 he underwent septal surgery and - his right nasion was restricted.  He has an asymmetric face , his right zygomatic arch is larger an his right cheek is puffier.  Review of Systems: Out of a complete 14 system review, the patient complains of only the following symptoms, and all other reviewed systems are negative.    Epworth score 5 , Fatigue severity score 38  , depression score 1.   History   Social History  . Marital Status:  Single    Spouse Name: N/A    Number of Children: 1  . Years of Education: college   Occupational History  .  [employer]   Social History Main Topics  . Smoking status: Former Smoker    Quit date: 06/08/2011  . Smokeless tobacco: Not on file  . Alcohol Use: No  . Drug Use: No  . Sexual Activity: Yes   Other Topics Concern  . Not on file   Social History Narrative   Marital status: married;       Children: none      Lives: with wife.      Employment: works at [employer]; Solicitorclerk.      Tobacco: none      Alcohol: none      Drugs: none      Exercise: gym 4-5 days per week.    Family History  Problem Relation Age of Onset  . Hypertension Mother   . Cancer Mother   . Hypertension Father     Past Medical History  Diagnosis Date  . Hypertension   . Sleep apnea   . Asthma   . Allergy     allergy shots at [facility]    Past Surgical History  Procedure Laterality  Date  . Achilles tendon repair      Current Outpatient Prescriptions  Medication Sig Dispense Refill  . amLODipine (NORVASC) 5 MG tablet Take 1 tablet (5 mg total) by mouth daily.  90 tablet  3  . cetirizine (ZYRTEC) 10 MG tablet Take 1 tablet (10 mg total) by mouth daily.  30 tablet  11  . losartan-hydrochlorothiazide (HYZAAR) 100-25 MG per tablet Take 1 tablet by mouth daily.  30 tablet  5  . Multiple Vitamins-Minerals (ADEKS) chewable tablet Chew 1 tablet by mouth daily.       No current facility-administered medications for this visit.    Allergies as of 04/18/2014  . (No Known Allergies)    Vitals: BP 151/97  Pulse 68  Resp 17  Ht 5\' 11"  (1.803 m)  Wt 262 lb (118.842 kg)  BMI 36.56 kg/m2 Last Weight:  Wt Readings from Last 1 Encounters:  04/18/14 262 lb (118.842 kg)       Last Height:   Ht Readings from Last 1 Encounters:  04/18/14 5\' 11"  (1.803 m)    Physical exam:  General: The patient is awake, alert and appears not in acute distress. The patient is well  groomed. Head: Normocephalic, atraumatic. Neck is supple. Mallampati 2- the uvula appears glued to the left pillar, and his tonsils are red and enlarged. ,  neck circumference: 19. Nasal airflow congested , TMJ is not evident . Retrognathia is not seen.  Cardiovascular:  Regular rate and rhythm , without  murmurs or carotid bruit, and without distended neck veins. Respiratory: Lungs are clear to auscultation. Skin:  Without evidence of edema, or rash Trunk: BMI is elevated and patient  has normal posture.  Neurologic exam : The patient is awake and alert, oriented to place and time.   Memory subjective  described as intact. There is a normal attention span & concentration ability. Speech is fluent without  dysarthria, dysphonia or aphasia. Mood and affect are appropriate.  Cranial nerves: Pupils are equal and briskly reactive to light. Funduscopic exam without  evidence of pallor or edema. Extraocular movements  in vertical and horizontal planes intact and without nystagmus. Visual fields by finger perimetry are intact. Hearing to finger rub intact.  Facial sensation intact to fine touch. Facial motor strength is symmetric and tongue and uvula move midline.  Motor exam:  Normal tone, muscle bulk and symmetric strength in all extremities.  Sensory:  Fine touch, pinprick and vibration were tested in all extremities. Proprioception is tested in the upper extremities only. This was  normal.  Coordination: Rapid alternating movements in the fingers/hands is normal. Finger-to-nose maneuver normal without evidence of ataxia, dysmetria or tremor.  Gait and station: Patient walks without assistive device and is able unassisted to climb up to the exam table. Strength within normal limits. Stance is stable and normal. Tandem gait is unfragmented. Romberg testing is  negative.  Deep tendon reflexes: in the  upper and lower extremities are symmetric and intact. Babinski maneuver response is   downgoing.   Assessment:  After physical and neurologic examination, review of laboratory studies, imaging, neurophysiology testing and pre-existing records, assessment is   1) patient with known OSA, diagnosed in 2007- still compliant with his CPAP , now at 10 cm water with 2 cm EPR.  [facility] supplied tubing and masks, filter.   2) [facility] or [facility] to follow patient, I need an auto titration  5-15 cm water , and follow up in 30 days . He may need  a different setting now after surgery. He gained weight, his mask is a nasal, XL size. His machine is Resmed S.  3)sleep hygiene addressed.  4) dietary changes, needs to exercise and low carb-    The patient was advised of the nature of the diagnosed sleep disorder , the treatment options and risks for general a health and wellness arising from not treating the condition. Visit duration was 45  minutes.         Porfirio Mylararmen Carlotta Telfair MD  04/18/2014

## 2014-04-18 NOTE — Patient Instructions (Signed)
Sleep Apnea  Sleep apnea is a sleep disorder characterized by abnormal pauses in breathing while you sleep. When your breathing pauses, the level of oxygen in your blood decreases. This causes you to move out of deep sleep and into light sleep. As a result, your quality of sleep is poor, and the system that carries your blood throughout your body (cardiovascular system) experiences stress. If sleep apnea remains untreated, the following conditions can develop:  High blood pressure (hypertension).  Coronary artery disease.  Inability to achieve or maintain an erection (impotence).  Impairment of your thought process (cognitive dysfunction). There are three types of sleep apnea: 1. Obstructive sleep apnea--Pauses in breathing during sleep because of a blocked airway. 2. Central sleep apnea--Pauses in breathing during sleep because the area of the brain that controls your breathing does not send the correct signals to the muscles that control breathing. 3. Mixed sleep apnea--A combination of both obstructive and central sleep apnea. RISK FACTORS The following risk factors can increase your risk of developing sleep apnea:  Being overweight.  Smoking.  Having narrow passages in your nose and throat.  Being of older age.  Being male.  Alcohol use.  Sedative and tranquilizer use.  Ethnicity. Among individuals younger than 35 years, African Americans are at increased risk of sleep apnea. SYMPTOMS   Difficulty staying asleep.  Daytime sleepiness and fatigue.  Loss of energy.  Irritability.  Loud, heavy snoring.  Morning headaches.  Trouble concentrating.  Forgetfulness.  Decreased interest in sex. DIAGNOSIS  In order to diagnose sleep apnea, your caregiver will perform a physical examination. Your caregiver may suggest that you take a home sleep test. Your caregiver may also recommend that you spend the night in a sleep lab. In the sleep lab, several monitors record  information about your heart, lungs, and brain while you sleep. Your leg and arm movements and blood oxygen level are also recorded. TREATMENT The following actions may help to resolve mild sleep apnea:  Sleeping on your side.   Using a decongestant if you have nasal congestion.   Avoiding the use of depressants, including alcohol, sedatives, and narcotics.   Losing weight and modifying your diet if you are overweight. There also are devices and treatments to help open your airway:  Oral appliances. These are custom-made mouthpieces that shift your lower jaw forward and slightly open your bite. This opens your airway.  Devices that create positive airway pressure. This positive pressure "splints" your airway open to help you breathe better during sleep. The following devices create positive airway pressure:  Continuous positive airway pressure (CPAP) device. The CPAP device creates a continuous level of air pressure with an air pump. The air is delivered to your airway through a mask while you sleep. This continuous pressure keeps your airway open.  Nasal expiratory positive airway pressure (EPAP) device. The EPAP device creates positive air pressure as you exhale. The device consists of single-use valves, which are inserted into each nostril and held in place by adhesive. The valves create very little resistance when you inhale but create much more resistance when you exhale. That increased resistance creates the positive airway pressure. This positive pressure while you exhale keeps your airway open, making it easier to breath when you inhale again.  Bilevel positive airway pressure (BPAP) device. The BPAP device is used mainly in patients with central sleep apnea. This device is similar to the CPAP device because it also uses an air pump to deliver continuous air pressure   through a mask. However, with the BPAP machine, the pressure is set at two different levels. The pressure when you  exhale is lower than the pressure when you inhale.  Surgery. Typically, surgery is only done if you cannot comply with less invasive treatments or if the less invasive treatments do not improve your condition. Surgery involves removing excess tissue in your airway to create a wider passage way. Document Released: 06/17/2002 Document Revised: 10/22/2012 Document Reviewed: 11/03/2011 Exit[employer] Patient Information 2015 [employer]. This information is not intended to replace advice given to you by your health care provider. Make sure you discuss any questions you have with your health care provider.  

## 2014-05-29 DIAGNOSIS — Z0289 Encounter for other administrative examinations: Secondary | ICD-10-CM

## 2014-05-30 ENCOUNTER — Ambulatory Visit: Payer: Self-pay | Admitting: Neurology

## 2014-07-18 ENCOUNTER — Ambulatory Visit: Payer: BC Managed Care – PPO | Admitting: Neurology

## 2014-10-10 ENCOUNTER — Other Ambulatory Visit: Payer: Self-pay | Admitting: Family Medicine

## 2014-10-25 ENCOUNTER — Ambulatory Visit (INDEPENDENT_AMBULATORY_CARE_PROVIDER_SITE_OTHER): Payer: Federal, State, Local not specified - PPO | Admitting: Family Medicine

## 2014-10-25 VITALS — BP 130/86 | HR 70 | Temp 98.2°F | Resp 12 | Ht 70.0 in | Wt 248.5 lb

## 2014-10-25 DIAGNOSIS — J0100 Acute maxillary sinusitis, unspecified: Secondary | ICD-10-CM

## 2014-10-25 MED ORDER — LEVOFLOXACIN 500 MG PO TABS: 500.0000 mg | ORAL_TABLET | Freq: Every day | ORAL | Status: DC

## 2014-10-25 NOTE — Patient Instructions (Signed)

## 2014-10-25 NOTE — Progress Notes (Signed)
Patient ID: Dylan Washington Neumeier, male   DOB: [birth year], 54 y.o.   MRN: [id number]   This chart was scribed for [name], MD by [facility] Center[facility]adim Abu Hashem, medical scribe at [facility].The patient was seen in exam room 04 and the patient's care was started at 1:51 PM.  Patient ID: Dylan Washington Pecora MRN: [id number], DOB: [birth year], 54 y.o. Date of Encounter: 10/25/2014  Primary Physician: Elvina SidleLAUENSTEIN,KURT, MD  Chief Complaint:  Chief Complaint  Patient presents with   Sinusitis    Started a week ago (h/O sinusisits)   HPI:  Dylan Washington Vanblarcom is a 54 y.o. male with chronic sinuitis who [facility] complaining of a recurrent sinus infection. He would also like to file for family medical leave. Pt still exercises.  Mother passed away last year March 10 th, due to rectal cancer.   Still working the same job with the post office. He is increasingly frustrated and stressed because of his job, because of ongoing racism. He wants to go on family medical leave then retire and open his own business in 2 years. His wife want to open up a restaurant.   Past Medical History  Diagnosis Date   Hypertension    Sleep apnea    Asthma    Allergy     allergy shots at [facility]    Home Meds: Prior to Admission medications   Medication Sig Start Date End Date Taking? Authorizing Provider  amLODipine (NORVASC) 5 MG tablet Take 1 tablet (5 mg total) by mouth daily. 10/07/13  Yes [name], MD  cetirizine (ZYRTEC) 10 MG tablet Take 1 tablet (10 mg total) by mouth daily. 03/17/13  Yes [name], MD  losartan-hydrochlorothiazide (HYZAAR) 100-25 MG per tablet Take 1 tablet by mouth daily. PATIENT NEEDS OFFICE VISIT FOR ADDITIONAL REFILLS 10/10/14  Yes Chelle S Jeffery, PA-C  Multiple Vitamins-Minerals (ADEKS) chewable tablet Chew 1 tablet by mouth daily.   Yes Historical Provider, MD   Allergies: No Known Allergies  History   Social History    Marital Status: Single    Spouse Name: N/A   Number of Children: 1   Years of Education: college   Occupational History    [employer]   Social History Main Topics   Smoking status: Former Smoker    Quit date: 06/08/2011   Smokeless tobacco: Not on file   Alcohol Use: No   Drug Use: No   Sexual Activity: Yes   Other Topics Concern   Not on file   Social History Narrative   Marital status: married;       Children: none      Lives: with wife.      Employment: works at [employer]; Solicitorclerk.      Tobacco: none      Alcohol: none      Drugs: none      Exercise: gym 4-5 days per week.    Review of Systems: Constitutional: negative for chills, fever, night sweats, weight changes, or fatigue  HEENT: negative for vision changes, hearing loss, congestion, rhinorrhea, ST, epistaxis, or sinus pressure Cardiovascular: negative for chest pain or palpitations Respiratory: negative for hemoptysis, wheezing, shortness of breath, or cough Abdominal: negative for abdominal pain, nausea, vomiting, diarrhea, or constipation Dermatological: negative for rash Neurologic: negative for headache, dizziness, or syncope All other systems reviewed and are otherwise negative with the exception to those above and in the HPI.  Physical Exam: Blood pressure 130/86, pulse  70, temperature 98.2 F (36.8 C), temperature source Oral, resp. rate 12, height  (1.778 m), weight 248 lb 8 oz (112.719 kg), SpO2 99 %., Body mass index is 35.66 kg/(m^2). General: Well developed, well nourished, in no acute distress. Head: Normocephalic, atraumatic, eyes without discharge, sclera non-icteric, nares are without discharge. Bilateral auditory canals clear, TM's are without perforation, pearly grey and translucent with reflective cone of light bilaterally. Oral cavity moist, posterior pharynx without exudate, erythema, peritonsillar abscess, or post nasal drip. narrowed nasal passages with mucosal purulent  discharge.  Neck: Supple. No thyromegaly. Full ROM. No lymphadenopathy. Lungs: Clear bilaterally to auscultation without wheezes, rales, or rhonchi. Breathing is unlabored. Heart: RRR with S1 S2. No murmurs, rubs, or gallops appreciated. Abdomen: Soft, non-tender, non-distended with normoactive bowel sounds. No hepatomegaly. No rebound/guarding. No obvious abdominal masses. Msk:  Strength and tone normal for age. Extremities/Skin: Warm and dry. No clubbing or cyanosis. No edema. No rashes or suspicious lesions. Neuro: Alert and oriented X 3. Moves all extremities spontaneously. Gait is normal. CNII-XII grossly in tact. Psych:  Responds to questions appropriately with a normal affect.   Labs:  ASSESSMENT AND PLAN:  54 y.o. year old male with sinusitis This chart was scribed in my presence and reviewed by me personally.    ICD-9-CM ICD-10-CM   1. Acute maxillary sinusitis, recurrence not specified 461.0 J01.00 levofloxacin (LEVAQUIN) 500 MG tablet     Signed, Elvina Sidle, MD  Signed, Elvina Sidle, MD 10/25/2014 2:06 PM

## 2014-11-05 ENCOUNTER — Ambulatory Visit (INDEPENDENT_AMBULATORY_CARE_PROVIDER_SITE_OTHER): Payer: Federal, State, Local not specified - PPO | Admitting: Family Medicine

## 2014-11-05 VITALS — BP 140/90 | HR 65 | Temp 98.6°F | Resp 20 | Ht 70.0 in | Wt 255.6 lb

## 2014-11-05 DIAGNOSIS — J0100 Acute maxillary sinusitis, unspecified: Secondary | ICD-10-CM | POA: Diagnosis not present

## 2014-11-05 DIAGNOSIS — J0101 Acute recurrent maxillary sinusitis: Secondary | ICD-10-CM

## 2014-11-05 MED ORDER — METHYLPREDNISOLONE ACETATE 80 MG/ML IJ SUSP
80.0000 mg | Freq: Once | INTRAMUSCULAR | Status: AC
  Administered 2014-11-05: 80 mg via INTRAMUSCULAR

## 2014-11-05 MED ORDER — LEVOFLOXACIN 500 MG PO TABS: 500.0000 mg | ORAL_TABLET | Freq: Every day | ORAL | Status: DC

## 2014-11-05 NOTE — Patient Instructions (Signed)

## 2014-11-05 NOTE — Progress Notes (Signed)
Subjective:  This chart was scribed for Elvina Sidle MD, by Veverly Fells, at [facility].  This patient was seen in room 1and the patient's care was started at 11:18 AM.    Patient ID: Dylan Washington, male    DOB: 1960-08-07, 54 y.o.   MRN: [id number]   Chief Complaint  Patient presents with   Follow-up    sinusitis      HPI HPI Comments: Dylan Washington is a 54 y.o. male with a history of chronic frontal sinusitis who presents [facility] the [facility] with FMLA paper work and states that he still is having issues with his sinuses.  He has difficulty breathing due to his congestion.  Patient had sinus surgery in 5/15 and has been seen here in the past for sinuses as well. He states that he thinks that he needs the three weeks worth of Levaquin.  Patient receives shots for his allergies.  He has no other complaints today. He currently works in [place] at the [employer].     Patient Active Problem List   Diagnosis Date Noted   SOB (shortness of breath) on exertion 04/02/2014   RBBB (right bundle branch block) 04/02/2014   Chronic frontal sinusitis 04/02/2014   Asthma 01/17/2012   Environmental allergies 08/16/2011   Hypertension    Sleep apnea    Past Medical History  Diagnosis Date   Hypertension    Sleep apnea    Asthma    Allergy     allergy shots at [facility]   Past Surgical History  Procedure Laterality Date   Achilles tendon repair     No Known Allergies Prior to Admission medications   Medication Sig Start Date End Date Taking? Authorizing Provider  amLODipine (NORVASC) 5 MG tablet Take 1 tablet (5 mg total) by mouth daily. 10/07/13  Yes Elvina Sidle, MD  cetirizine (ZYRTEC) 10 MG tablet Take 1 tablet (10 mg total) by mouth daily. 03/17/13  Yes Elvina Sidle, MD  levofloxacin (LEVAQUIN) 500 MG tablet Take 1 tablet (500 mg total) by mouth daily. 10/25/14  Yes Elvina Sidle, MD    losartan-hydrochlorothiazide (HYZAAR) 100-25 MG per tablet Take 1 tablet by mouth daily. PATIENT NEEDS OFFICE VISIT FOR ADDITIONAL REFILLS 10/10/14  Yes Chelle S Jeffery, PA-C  Multiple Vitamins-Minerals (ADEKS) chewable tablet Chew 1 tablet by mouth daily.   Yes Historical Provider, MD   History   Social History   Marital Status: Single    Spouse Name: N/A   Number of Children: 1   Years of Education: college   Occupational History    [employer] [employer]   Social History Main Topics   Smoking status: Former Smoker    Quit date: 06/08/2011   Smokeless tobacco: Not on file   Alcohol Use: No   Drug Use: No   Sexual Activity: Yes   Other Topics Concern   Not on file   Social History Narrative   Marital status: married;       Children: none      Lives: with wife.      Employment: works at [employer]; Solicitor.      Tobacco: none      Alcohol: none      Drugs: none      Exercise: gym 4-5 days per week.    Review of Systems  Constitutional: Negative for fever and chills.  HENT: Positive for congestion, rhinorrhea and sinus pressure.   Gastrointestinal: Negative for  nausea and vomiting.       Objective:   Physical Exam  This well-built, muscular man in no acute distress HEENT: Unremarkable except for some mild swelling in the nasal passages  FMLA forms were filled out  Filed Vitals:   11/05/14 1022  BP: 140/90  Pulse: 65  Temp: 98.6 F (37 C)  TempSrc: Oral  Resp: 20  Height: 5\' 10"  (1.778 m)  Weight: 255 lb 9.6 oz (115.939 kg)  SpO2: 98%          Assessment & Plan:   This chart was scribed in my presence and reviewed by me personally.    ICD-9-CM ICD-10-CM   1. Recurrent maxillary sinusitis, unspecified chronicity 461.0 J01.01 methylPREDNISolone acetate (DEPO-MEDROL) injection 80 mg  2. Acute maxillary sinusitis, recurrence not specified 461.0 J01.00 levofloxacin (LEVAQUIN) 500 MG tablet     Signed, [name], MD

## 2015-01-01 ENCOUNTER — Other Ambulatory Visit: Payer: Self-pay | Admitting: Physician Assistant

## 2015-03-16 ENCOUNTER — Other Ambulatory Visit: Payer: Self-pay | Admitting: Physician Assistant

## 2015-03-30 ENCOUNTER — Telehealth: Payer: Self-pay | Admitting: *Deleted

## 2015-03-30 NOTE — Telephone Encounter (Signed)
I called the pharmacist and they already took care of this.

## 2015-03-30 NOTE — Telephone Encounter (Signed)
The patient was last seen here on 11/05/2014 by Dr. Milus Glazier. At that time, the medication list included losartan-HCTZ written by me on 10/10/2014, and blood pressure was not addressed.  On 01/01/2015 the Losartan-HCTZ was refilled on my behalf per our protocol, indicating that the patient needed to return for re-evaluation of his blood pressure for additional refills.  On 03/16/2015, it was refilled again on my behalf per our protocol, again indicating that the patient needed to return for re-evaluation of blood pressure for additional refills.  I do not see any other visits in [facility], and I do not know who he may have seen elsewhere who may be prescribing for him.    Dylan Washington

## 2015-03-30 NOTE — Telephone Encounter (Signed)
Pharmacy called stating the pt requested a refill for Losartan/HCTZ but she noticed he picked up plain Losartan where a different prescriber sent in. FYI Dr. Milus Glazier.

## 2015-03-30 NOTE — Telephone Encounter (Signed)
[facility] pharmacy  called stating patient has a Rx for losartan from Chelle but also has another Rx for Hctz from another provider. What should patient be taking. [facility] number [phone]

## 2015-04-10 ENCOUNTER — Ambulatory Visit (INDEPENDENT_AMBULATORY_CARE_PROVIDER_SITE_OTHER): Payer: Federal, State, Local not specified - PPO | Admitting: Family Medicine

## 2015-04-10 VITALS — BP 172/106 | HR 90 | Temp 98.1°F | Resp 16 | Ht 70.5 in | Wt 260.2 lb

## 2015-04-10 DIAGNOSIS — J320 Chronic maxillary sinusitis: Secondary | ICD-10-CM

## 2015-04-10 DIAGNOSIS — I10 Essential (primary) hypertension: Secondary | ICD-10-CM

## 2015-04-10 MED ORDER — FLUTICASONE PROPIONATE 50 MCG/ACT NA SUSP: 2.0000 | Freq: Every day | NASAL | Status: DC

## 2015-04-10 NOTE — Progress Notes (Addendum)
This chart was scribed for Dylan Sidle, MD by Stann Ore, medical scribe at [facility].The patient was seen in exam room 9 and the patient's care was started at 2:11 PM.  Patient ID: [name] MRN: [id number], DOB: 01-10-1961, 54 y.o. Date of Encounter: 04/10/2015  Primary Physician: Dylan Sidle, MD  Chief Complaint:  Chief Complaint  Patient presents with   Nasal Congestion    x days   Sinusitis   Ear Pain    L ear    HPI:  Dylan Washington is a 54 y.o. male who presents to [facility] complaining of sinusitis with nasal congestion and left ear pain that started a few days ago. He's taking zyrtec. He's still taking his BP medication but didn't take it today.   Patient had a very difficult time this month when he was told to discontinue clonazepam. Became moody and had trouble sleeping and only now is returned to his normal personality. He feels he can return to work now.  Past Medical History  Diagnosis Date   Hypertension    Sleep apnea    Asthma    Allergy     allergy shots at [facility]     Home Meds: Prior to Admission medications   Medication Sig Start Date End Date Taking? Authorizing Provider  amLODipine (NORVASC) 5 MG tablet Take 1 tablet (5 mg total) by mouth daily. 10/07/13   Dylan Sidle, MD  cetirizine (ZYRTEC) 10 MG tablet Take 1 tablet (10 mg total) by mouth daily. 03/17/13   Dylan Sidle, MD  levofloxacin (LEVAQUIN) 500 MG tablet Take 1 tablet (500 mg total) by mouth daily. 11/05/14   Dylan Sidle, MD  losartan-hydrochlorothiazide (HYZAAR) 100-25 MG per tablet TAKE 1 TABLET BY MOUTH DAILY  "NO MORE REFILLS WITHOUT OV" 03/17/15   Porfirio Oar, PA-C  Multiple Vitamins-Minerals (ADEKS) chewable tablet Chew 1 tablet by mouth daily.    Historical Provider, MD    Allergies: No Known Allergies  Social History   Social History   Marital Status: Single    Spouse Name: N/A   Number of  Children: 1   Years of Education: college   Occupational History    [place] [employer]   Social History Main Topics   Smoking status: Former Smoker    Quit date: 06/08/2011   Smokeless tobacco: Not on file   Alcohol Use: No   Drug Use: No   Sexual Activity: Yes   Other Topics Concern   Not on file   Social History Narrative   Marital status: married;       Children: none      Lives: with wife.      Employment: works at [employer]; Solicitor.      Tobacco: none      Alcohol: none      Drugs: none      Exercise: gym 4-5 days per week.     Review of Systems: Constitutional: negative for chills, fever, night sweats, weight changes, or fatigue  HEENT: negative for vision changes, hearing loss, rhinorrhea, epistaxis; positive for sinus pressure, nasal congestion, ear pain (left)  Cardiovascular: negative for chest pain or palpitations Respiratory: negative for hemoptysis, wheezing, shortness of breath, or cough Abdominal: negative for abdominal pain, nausea, vomiting, diarrhea, or constipation Dermatological: negative for rash Neurologic: negative for headache, dizziness, or syncope All other systems reviewed and are otherwise negative with the exception to those above and in the HPI.  Physical Exam:  Blood pressure 172/106, pulse 90, temperature 98.1 F (36.7 C), temperature source Oral, resp. rate 16, height 5' 10.5" (1.791 m), weight 260 lb 3.2 oz (118.026 kg), SpO2 99 %., Body mass index is 36.79 kg/(m^2). General: Well developed, well nourished, in no acute distress. Head: Normocephalic, atraumatic, eyes without discharge, sclera non-icteric, nares are without discharge. Bilateral auditory canals clear, TM's are without perforation, pearly grey and translucent with reflective cone of light bilaterally. Oral cavity moist, posterior pharynx without exudate, erythema, peritonsillar abscess, or post nasal drip.  Neck: Supple. No thyromegaly. Full ROM. No  lymphadenopathy. Lungs: Clear bilaterally to auscultation without wheezes, rales, or rhonchi. Breathing is unlabored. Heart: RRR with S1 S2. No murmurs, rubs, or gallops appreciated. Abdomen: Soft, non-tender, non-distended with normoactive bowel sounds. No hepatomegaly. No rebound/guarding. No obvious abdominal masses. Msk:  Strength and tone normal for age. Extremities/Skin: Warm and dry. No clubbing or cyanosis. No edema. No rashes or suspicious lesions. Neuro: Alert and oriented X 3. Moves all extremities spontaneously. Gait is normal. CNII-XII grossly in tact. Psych:  Responds to questions appropriately with a normal affect.   Recheck blood pressure: 170/110  ASSESSMENT AND PLAN:  54 y.o. year old male with sinusitis and significant anxiety resulting from discontinuation of clonazepam. This chart was scribed in my presence and reviewed by me personally.    ICD-9-CM ICD-10-CM   1. Chronic maxillary sinusitis 473.0 J32.0 fluticasone (FLONASE) 50 MCG/ACT nasal spray  2. Essential hypertension 401.9 I10     Patient urged to resume his blood pressure medicine and come back in one month to recheck the blood pressure. By signing my name below, I, Stann Ore, attest that this documentation has been prepared under the direction and in the presence of Dylan Sidle, MD. Electronically Signed: Stann Ore, Scribe. 04/10/2015 , 2:32 PM .  Signed, Dylan Sidle, MD 04/10/2015 2:32 PM

## 2015-04-10 NOTE — Patient Instructions (Signed)

## 2015-04-16 ENCOUNTER — Ambulatory Visit (INDEPENDENT_AMBULATORY_CARE_PROVIDER_SITE_OTHER): Payer: Federal, State, Local not specified - PPO | Admitting: Family Medicine

## 2015-04-16 VITALS — BP 130/90 | HR 87 | Temp 98.9°F | Resp 16 | Ht 70.5 in | Wt 260.6 lb

## 2015-04-16 DIAGNOSIS — J320 Chronic maxillary sinusitis: Secondary | ICD-10-CM

## 2015-04-16 DIAGNOSIS — I10 Essential (primary) hypertension: Secondary | ICD-10-CM | POA: Diagnosis not present

## 2015-04-16 DIAGNOSIS — G4700 Insomnia, unspecified: Secondary | ICD-10-CM | POA: Diagnosis not present

## 2015-04-16 DIAGNOSIS — G4733 Obstructive sleep apnea (adult) (pediatric): Secondary | ICD-10-CM | POA: Diagnosis not present

## 2015-04-16 LAB — COMPLETE METABOLIC PANEL WITHOUT GFR
AST: 22 U/L (ref 10–35)
Albumin: 4.4 g/dL (ref 3.6–5.1)
Alkaline Phosphatase: 53 U/L (ref 40–115)
BUN: 10 mg/dL (ref 7–25)
GFR, Est Non African American: >89 mL/min (ref >=60)
Glucose, Bld: 83 mg/dL (ref 65–99)
Sodium: 137 mmol/L (ref 135–146)

## 2015-04-16 LAB — COMPLETE METABOLIC PANEL WITH GFR
ALT: 29 U/L (ref 9–46)
CO2: 29 mmol/L (ref 20–31)
Calcium: 9.7 mg/dL (ref 8.6–10.3)
Chloride: 100 mmol/L (ref 98–110)
Creat: 0.83 mg/dL (ref 0.70–1.33)
GFR, Est African American: >89 mL/min (ref >=60)
Potassium: 4.3 mmol/L (ref 3.5–5.3)
Total Bilirubin: 1.4 mg/dL — ABNORMAL HIGH (ref 0.2–1.2)
Total Protein: 7.1 g/dL (ref 6.1–8.1)

## 2015-04-16 NOTE — Progress Notes (Signed)
Chief Complaint:  Chief Complaint  Patient presents with  . Blood pressure check    is on new BP medication, and exiperenced pulsing feeling on lower left side of his head    HPI: Dylan Washington is a 54 y.o. male who reports to [facility] today complaining of here for HTN recheck, he is on CPAP, does not use it for 8 hours, his pressure is 12 but he likes it to be 8 so it doe snot hear it and akes himself up from sleep He doe snot feel like himself for the last week, he thinks there is a lot of stress, he can;t go to sleep. He has tried melatonin, he has taken himself off Klonopin and was on it for a long time. He does not want to rely on it.  He does not want any other rx sleep aid. He He deneis any CP or SOB, this week , he feels the medicine is kicking in but slowly , he doe snot having the tingling feeling in the back of his head. His BP is better.  HE has not been able to work and was worried. He has had issues at work where he feels his supervisor has been unjustly unfair to him and is tryign to provoke him, so he has transferred to [place] ans is working the midnight shift , he goes in at 12 am and gets out at 8 am . He doe snot think he wants to try to change yet since he has only been there 4 months and does not want to cause problems.   BP Readings from Last 3 Encounters:  04/16/15 130/90  04/10/15 172/106  11/05/14 140/90   HE went to ENT in [facility] and then was ok for a while and that was in May 2015. He had sinus surgeries. He needs to allergy shots. He took allergy shots and did not want to get them again because arms would swell after her got  allergy shots.     Past Medical History  Diagnosis Date  . Hypertension   . Sleep apnea   . Asthma   . Allergy     allergy shots at [facility]   Past Surgical History  Procedure Laterality Date  . Achilles tendon repair     Social History   Social History  . Marital Status: Single    Spouse Name: N/A  . Number of  Children: 1  . Years of Education: college   Occupational History  .  [employer] [employer]   Social History Main Topics  . Smoking status: Former Smoker    Quit date: 06/08/2011  . Smokeless tobacco: None  . Alcohol Use: No  . Drug Use: No  . Sexual Activity: Yes   Other Topics Concern  . None   Social History Narrative   Marital status: married;       Children: none      Lives: with wife.      Employment: works at [employer]; Solicitor.      Tobacco: none      Alcohol: none      Drugs: none      Exercise: gym 4-5 days per week.   Family History  Problem Relation Age of Onset  . Hypertension Mother   . Cancer Mother   . Hypertension Father    No Known Allergies Prior to Admission medications   Medication Sig Start Date End Date Taking? Authorizing Provider  amLODipine (NORVASC)  5 MG tablet Take 1 tablet (5 mg total) by mouth daily. 10/07/13  Yes Elvina Sidle, MD  cetirizine (ZYRTEC) 10 MG tablet Take 1 tablet (10 mg total) by mouth daily. 03/17/13  Yes Elvina Sidle, MD  fluticasone (FLONASE) 50 MCG/ACT nasal spray Place 2 sprays into both nostrils daily. 04/10/15  Yes Elvina Sidle, MD  losartan-hydrochlorothiazide (HYZAAR) 100-25 MG per tablet TAKE 1 TABLET BY MOUTH DAILY  "NO MORE REFILLS WITHOUT OV" 03/17/15  Yes Chelle Jeffery, PA-C  Multiple Vitamins-Minerals (ADEKS) chewable tablet Chew 1 tablet by mouth daily.   Yes Historical Provider, MD     ROS: The patient denies fevers, chills, night sweats, unintentional weight loss, chest pain, palpitations, wheezing, dyspnea on exertion, nausea, vomiting, abdominal pain, dysuria, hematuria, melena, numbness, weakness, or tingling.   All other systems have been reviewed and were otherwise negative with the exception of those mentioned in the HPI and as above.    PHYSICAL EXAM: Filed Vitals:   04/16/15 1243  BP: 130/90  Pulse: 87  Temp: 98.9 F (37.2 C)  Resp: 16   Body mass index is 36.85 kg/(m^2).   General:  Alert, no acute distress HEENT:  Normocephalic, atraumatic, oropharynx patent. EOMI, PERRLA, fundo exam normal Cardiovascular:  Regular rate and rhythm, no rubs murmurs or gallops.  No Carotid bruits, radial pulse intact. No pedal edema.  Respiratory: Clear to auscultation bilaterally.  No wheezes, rales, or rhonchi.  No cyanosis, no use of accessory musculature Abdominal: No organomegaly, abdomen is soft and non-tender, positive bowel sounds. No masses. Skin: No rashes. Neurologic: Facial musculature symmetric. Cn 2-12 grossly nl Psychiatric: Patient acts appropriately throughout our interaction. Lymphatic: No cervical or submandibular lymphadenopathy Musculoskeletal: Gait intact. No edema, tenderness   LABS: Results for orders placed or performed in visit on 04/16/15  COMPLETE METABOLIC PANEL WITH GFR  Result Value Ref Range   Sodium 137 135 - 146 mmol/L   Potassium 4.3 3.5 - 5.3 mmol/L   Chloride 100 98 - 110 mmol/L   CO2 29 20 - 31 mmol/L   Glucose, Bld 83 65 - 99 mg/dL   BUN 10 7 - 25 mg/dL   Creat 8.11 9.14 - 7.82 mg/dL   Total Bilirubin 1.4 (H) 0.2 - 1.2 mg/dL   Alkaline Phosphatase 53 40 - 115 U/L   AST 22 10 - 35 U/L   ALT 29 9 - 46 U/L   Total Protein 7.1 6.1 - 8.1 g/dL   Albumin 4.4 3.6 - 5.1 g/dL   Calcium 9.7 8.6 - 95.6 mg/dL   GFR, Est African American >89 >=60 mL/min   GFR, Est Non African American >89 >=60 mL/min     EKG/XRAY:   Primary read interpreted by Dr. Conley Rolls at [facility].   ASSESSMENT/PLAN: Encounter Diagnoses  Name Primary?  . Insomnia   . Essential hypertension Yes  . Chronic maxillary sinusitis   . Obstructive sleep apnea    54 y/o AA male with PMH of chronic sinusitis, HTN, OSA noncompliant with CPAP setting, insomnia, work stress Would like to be on less meds so has taken himself off klonopin Neuro exam normal BP is improved Will get basic labs since on new meds Will refer him to new sleep specialist, he does not want to return to Dr Dohmeier's  office Fu prn   Gross sideeffects, risk and benefits, and alternatives of medications d/w patient. Patient is aware that all medications have potential sideeffects and we are unable to predict every sideeffect or drug-drug interaction  that may occur.  Thao Le DO  04/18/2015 1:50 PM

## 2015-04-16 NOTE — Patient Instructions (Signed)
Melatonin oral solid dosage forms What is this medicine? MELATONIN (mel uh TOH nin) is a dietary supplement. It is mostly promoted to help maintain normal sleep patterns. The FDA has not approved this supplement for any medical use. This supplement may be used for other purposes; ask your health care provider or pharmacist if you have questions. This medicine may be used for other purposes; ask your health care provider or pharmacist if you have questions. What should I tell my health care provider before I take this medicine? They need to know if you have any of these conditions: -asthma -cancer -depression or mental illness -diabetes -hormone problems -if you often drink alcohol -immune system problems -liver disease -organ transplant -seizure disorder -an unusual or allergic reaction to melatonin, other medicines, foods, dyes, or preservatives -pregnant or trying to get pregnant -breast-feeding How should I use this medicine? Take this supplement by mouth with a glass of water. Do not take with food. This supplement is usually taken 1 or 2 hours before bedtime. After taking this supplement, limit your activities to those needed to prepare for bed. Some products may be chewed or dissolved in the mouth before swallowing. Some tablets or capsules must be swallowed whole; do not cut, crush or chew. Follow the directions on the package labeling, or take as directed by your health care professional. Do not take this supplement more often than directed. Talk to your pediatrician regarding the use of this supplement in children. Special care may be needed. This supplement is not recommended for use in children without a prescription. Overdosage: If you think you have taken too much of this medicine contact a poison control center or emergency room at once. NOTE: This medicine is only for you. Do not share this medicine with others. What if I miss a dose? If you miss taking your dose at the usual  time, skip that dose. If it is almost time for your next dose, take only that dose. Do not take double or extra doses. What may interact with this medicine? Do not take this medicine with any of the following medications: -fluvoxamine -ramelteon -tasimelteon This medicine may also interact with the following medications: -alcohol -atazanavir -caffeine -carbamazepine -certain antibiotics like ciprofloxacin, enoxacin -certain medicines for depression, anxiety, or psychotic disturbances -cimetidine -male hormones, like estrogens and birth control pills, patches, rings, or injections -methoxsalen -nifedipine -other medications for sleep -other herbal or dietary supplements -phenobarbital -rifampin -smoking tobacco -tamoxifen -treatments for cancer, organ transplant, or immune disorders This list may not describe all possible interactions. Give your health care provider a list of all the medicines, herbs, non-prescription drugs, or dietary supplements you use. Also tell them if you smoke, drink alcohol, or use illegal drugs. Some items may interact with your medicine. What should I watch for while using this medicine? See your doctor if your symptoms do not get better or if they get worse. Do not take this supplement for more than 2 weeks unless your doctor tells you to. You may get drowsy or dizzy. Do not drive, use machinery, or do anything that needs mental alertness until you know how this medicine affects you. Do not stand or sit up quickly, especially if you are an older patient. This reduces the risk of dizzy or fainting spells. Alcohol may interfere with the effect of this medicine. Avoid alcoholic drinks. Talk to your doctor before you use this supplement if you are currently being treated for an emotional, mental, or sleep problem. This medicine  may interfere with your treatment. Herbal or dietary supplements are not regulated like medicines. Rigid quality control standards are  not required for dietary supplements. The purity and strength of these products can vary. The safety and effect of this dietary supplement for a certain disease or illness is not well known. This product is not intended to diagnose, treat, cure or prevent any disease. The Food and Drug Administration suggests the following to help consumers protect themselves: -Always read product labels and follow directions. -Natural does not mean a product is safe for humans to take. -Look for products that include USP after the ingredient name. This means that the manufacturer followed the standards of the [place] Pharmacopoeia. -Supplements made or sold by a nationally known food or drug company are more likely to be made under tight controls. You can write to the company for more information about how the product was made. What side effects may I notice from receiving this medicine? Side effects that you should report to your doctor or health care professional as soon as possible: -allergic reactions like skin rash, itching or hives, swelling of the face, lips, or tongue -breathing problems -change in sex drive or performance -confusion -depressed mood, irritable, or other changes in moods or behaviors -fast, irregular heartbeat -feeling faint or lightheaded, falls -irregular or missed menstrual periods -leakage of milk from the nipples in a person who is not breast-feeding -signs and symptoms of liver injury like dark yellow or brown urine; general ill feeling or flu-like symptoms; light-colored stools; loss of appetite; nausea; right upper belly pain; unusually weak or tired; yellowing of the eyes or skin -sleep-walking -trouble staying awake or alert during the day -unusual activities while you are still asleep like driving, eating, making phone calls -unusual bleeding or bruising Side effects that usually do not require medical attention (report to your doctor or health care professional if they continue  or are bothersome): -dizziness -drowsiness -headache -tiredness -unusual dreams or nightmares -upset stomach This list may not describe all possible side effects. Call your doctor for medical advice about side effects. You may report side effects to FDA at 1-800-FDA-1088. Where should I keep my medicine? Keep out of the reach of children. Store at room temperature or as directed on the package label. Protect from moisture. Throw away any unused supplement after the expiration date. NOTE: This sheet is a summary. It may not cover all possible information. If you have questions about this medicine, talk to your doctor, pharmacist, or health care provider.    2016, Elsevier/Gold Standard. (2015-03-11 09:44:59) Insomnia Insomnia is a sleep disorder that makes it difficult to fall asleep or to stay asleep. Insomnia can cause tiredness (fatigue), low energy, difficulty concentrating, mood swings, and poor performance at work or school.  There are three different ways to classify insomnia:  Difficulty falling asleep.  Difficulty staying asleep.  Waking up too early in the morning. Any type of insomnia can be long-term (chronic) or short-term (acute). Both are common. Short-term insomnia usually lasts for three months or less. Chronic insomnia occurs at least three times a week for longer than three months. CAUSES  Insomnia may be caused by another condition, situation, or substance, such as:  Anxiety.  Certain medicines.  Gastroesophageal reflux disease (GERD) or other gastrointestinal conditions.  Asthma or other breathing conditions.  Restless legs syndrome, sleep apnea, or other sleep disorders.  Chronic pain.  Menopause. This may include hot flashes.  Stroke.  Abuse of alcohol, tobacco, or illegal  drugs.  Depression.  Caffeine.   Neurological disorders, such as Alzheimer disease.  An overactive thyroid (hyperthyroidism). The cause of insomnia may not be  known. RISK FACTORS Risk factors for insomnia include:  Gender. Women are more commonly affected than men.  Age. Insomnia is more common as you get older.  Stress. This may involve your professional or personal life.  Income. Insomnia is more common in people with lower income.  Lack of exercise.   Irregular work schedule or night shifts.  Traveling between different time zones. SIGNS AND SYMPTOMS If you have insomnia, trouble falling asleep or trouble staying asleep is the main symptom. This may lead to other symptoms, such as:  Feeling fatigued.  Feeling nervous about going to sleep.  Not feeling rested in the morning.  Having trouble concentrating.  Feeling irritable, anxious, or depressed. TREATMENT  Treatment for insomnia depends on the cause. If your insomnia is caused by an underlying condition, treatment will focus on addressing the condition. Treatment may also include:   Medicines to help you sleep.  Counseling or therapy.  Lifestyle adjustments. HOME CARE INSTRUCTIONS   Take medicines only as directed by your health care provider.  Keep regular sleeping and waking hours. Avoid naps.  Keep a sleep diary to help you and your health care provider figure out what could be causing your insomnia. Include:   When you sleep.  When you wake up during the night.  How well you sleep.   How rested you feel the next day.  Any side effects of medicines you are taking.  What you eat and drink.   Make your bedroom a comfortable place where it is easy to fall asleep:  Put up shades or special blackout curtains to block light from outside.  Use a white noise machine to block noise.  Keep the temperature cool.   Exercise regularly as directed by your health care provider. Avoid exercising right before bedtime.  Use relaxation techniques to manage stress. Ask your health care provider to suggest some techniques that may work well for you. These may  include:  Breathing exercises.  Routines to release muscle tension.  Visualizing peaceful scenes.  Cut back on alcohol, caffeinated beverages, and cigarettes, especially close to bedtime. These can disrupt your sleep.  Do not overeat or eat spicy foods right before bedtime. This can lead to digestive discomfort that can make it hard for you to sleep.  Limit screen use before bedtime. This includes:  Watching TV.  Using your smartphone, tablet, and computer.  Stick to a routine. This can help you fall asleep faster. Try to do a quiet activity, brush your teeth, and go to bed at the same time each night.  Get out of bed if you are still awake after 15 minutes of trying to sleep. Keep the lights down, but try reading or doing a quiet activity. When you feel sleepy, go back to bed.  Make sure that you drive carefully. Avoid driving if you feel very sleepy.  Keep all follow-up appointments as directed by your health care provider. This is important. SEEK MEDICAL CARE IF:   You are tired throughout the day or have trouble in your daily routine due to sleepiness.  You continue to have sleep problems or your sleep problems get worse. SEEK IMMEDIATE MEDICAL CARE IF:   You have serious thoughts about hurting yourself or someone else.   This information is not intended to replace advice given to you by your health  care provider. Make sure you discuss any questions you have with your health care provider.   Document Released: 06/24/2000 Document Revised: 03/18/2015 Document Reviewed: 03/28/2014 Elsevier Interactive Patient Education [employer]

## 2015-04-20 ENCOUNTER — Ambulatory Visit (INDEPENDENT_AMBULATORY_CARE_PROVIDER_SITE_OTHER): Payer: Federal, State, Local not specified - PPO | Admitting: Neurology

## 2015-04-20 ENCOUNTER — Encounter: Payer: Self-pay | Admitting: Neurology

## 2015-04-20 VITALS — BP 158/94 | HR 88 | Resp 20 | Ht 70.5 in | Wt 262.0 lb

## 2015-04-20 DIAGNOSIS — E669 Obesity, unspecified: Secondary | ICD-10-CM

## 2015-04-20 DIAGNOSIS — G4733 Obstructive sleep apnea (adult) (pediatric): Secondary | ICD-10-CM | POA: Diagnosis not present

## 2015-04-20 DIAGNOSIS — R351 Nocturia: Secondary | ICD-10-CM | POA: Diagnosis not present

## 2015-04-20 DIAGNOSIS — R635 Abnormal weight gain: Secondary | ICD-10-CM

## 2015-04-20 DIAGNOSIS — G4719 Other hypersomnia: Secondary | ICD-10-CM

## 2015-04-20 NOTE — Patient Instructions (Signed)
Based on your symptoms, your prior history and your exam I believe you have significant obstructive sleep apnea (OSA), and I think we should proceed with a sleep study to determine how severe it is to help adjust your CPAP. Please remember, the risks and ramifications of moderate to severe obstructive sleep apnea or OSA are: Cardiovascular disease, including congestive heart failure, stroke, difficult to control hypertension, arrhythmias, and even type 2 diabetes has been linked to untreated OSA. Sleep apnea causes disruption of sleep and sleep deprivation in most cases, which, in turn, can cause recurrent headaches, problems with memory, mood, concentration, focus, and vigilance. Most people with untreated sleep apnea report excessive daytime sleepiness, which can affect their ability to drive. Please do not drive if you feel sleepy.   I will likely see you back after your sleep study to go over the test results and where to go from there. We will call you after your sleep study to advise about the results (most likely, you will hear from Lafonda Mosses, my nurse) and to set up an appointment at the time, as necessary.    Our sleep lab administrative assistant, Alvis Lemmings will meet with you or call you to schedule your sleep study. If you don't hear back from her by next week please feel free to call her at [phone]. This is her direct line and please leave a message with your phone number to call back if you get the voicemail box. She will call back as soon as possible.

## 2015-04-20 NOTE — Progress Notes (Signed)
Subjective:    Patient ID: Dylan Washington is a 54 y.o. male.  HPI     Huston Foley, MD, PhD [facility] [address], [address] [address] [place], [place] [address]  Dear Dr. Conley Rolls,   I saw your patient, Dylan Washington, upon your kind request in my neurologic clinic today for initial consultation of his sleep disorder, in particular reevaluation of his obstructive sleep apnea. The patient is unaccompanied today. As you know, [name] is a 54 year old right-handed gentleman with an underlying medical history of hypertension, smoking, asthma, allergies, and obesity, who was previously diagnosed with obstructive sleep apnea in 2007. He was originally seen by Dr. Vickey Huger and last seen by her in October 2015. He was on CPAP and was placed on AutoPap therapy at the time. His original sleep study results from 2007 are not available for my review. I reviewed your office note from 04/16/2015. He had another sleep study with CPAP titration on 10/20/09, interpreted by Dr. Maple Hudson, which I reviewed: This was a split-night sleep study. His total AHI at baseline was 17.1 per hour, oxygen desaturation nadir was 68%. He was titrated on CPAP up to 17 cm and his AHI was 0 per hour on that pressure, mean oxygen saturation while on CPAP was 94.5%. He reports never having been on a pressure of 17 cm. He did not bring his machine today.  He may be on a autoPAP, but he is not sure. Higher pressures bother him. He does not use it regularly.  He has been working nights for the past 3 months. He is still trying to adjust to his schedule. He has had increase in his BP. He started Losartan for this. He has nocturia x 3 on average. He now goes to bed around 9 AM and he gets about 5 hours of sleep. His typical bedtime on Sunday nights and Monday nights is around midnight. He denies frank restless leg symptoms or leg twitching while asleep. His father has obstructive sleep apnea but is not using a CPAP  machine. He did not return for follow-up with Dr. Vickey Huger, as he reports he had some interim stressors and his mom passed away last year so he could not come for follow-up. His Epworth sleepiness score 7 out of 24 today, his fatigue score is 39 out of 63. We [facility]. I reviewed his CPAP compliance from 02/04/2015 through 03/05/2015 which is a total of 30 days during which time he used his machine 28 days with percent used days greater than 4 hours at 40% only, with an average usage of 3 hours and 54 minutes, sit pressure at 10.2 cm. Residual AHI is not available.  He does not drink caffeine on a daily basis. He does not drink alcohol. He smokes about 3 cigarettes per day.   His Past Medical History Is Significant For: Past Medical History  Diagnosis Date  . Hypertension   . Sleep apnea   . Asthma   . Allergy     allergy shots at [facility]    His Past Surgical History Is Significant For: Past Surgical History  Procedure Laterality Date  . Achilles tendon repair     His Family History Is Significant For: Family History  Problem Relation Age of Onset  . Hypertension Mother   . Cancer Mother   . Hypertension Father     His Social History Is Significant For: Social History   Social History  .  Marital Status: Single    Spouse Name: N/A  . Number of Children: 1  . Years of Education: college   Occupational History  .  [employer]   Social History Main Topics  . Smoking status: Current Every Day Smoker  . Smokeless tobacco: None  . Alcohol Use: No  . Drug Use: No  . Sexual Activity: Yes   Other Topics Concern  . None   Social History Narrative   Marital status: married;       Children: none      Lives: with wife.      Employment: works at [employer]; Solicitor.      Tobacco: none      Alcohol: none      Drugs: none      Exercise: gym 4-5 days per week.   Denies  Caffeine use     His Allergies Are:  No Known  Allergies:   His Current Medications Are:  Outpatient Encounter Prescriptions as of 04/20/2015  Medication Sig  . amLODipine (NORVASC) 5 MG tablet Take 1 tablet (5 mg total) by mouth daily.  . cetirizine (ZYRTEC) 10 MG tablet Take 1 tablet (10 mg total) by mouth daily.  . fluticasone (FLONASE) 50 MCG/ACT nasal spray Place 2 sprays into both nostrils daily.  Marland Kitchen losartan-hydrochlorothiazide (HYZAAR) 100-25 MG per tablet TAKE 1 TABLET BY MOUTH DAILY  "NO MORE REFILLS WITHOUT OV"  . Multiple Vitamins-Minerals (ADEKS) chewable tablet Chew 1 tablet by mouth daily.   No facility-administered encounter medications on file as of 04/20/2015.  :  Review of Systems:  Out of a complete 14 point review of systems, all are reviewed and negative with the exception of these symptoms as listed below:   Review of Systems  Constitutional: Positive for fatigue.  Respiratory: Positive for wheezing.   Neurological:       Last sleep study 5+ years ago.  Patient states that he needs a new sleep study. He feels that his pressure is not correct on CPAP machine. Witnessed apnea, snoring, wheezing, wakes up feeling tired in the morning, daytime tiredness.   Psychiatric/Behavioral:       Not enough sleep    Objective:  Neurologic Exam  Physical Exam Physical Examination:   Filed Vitals:   04/20/15 1331  BP: 158/94  Pulse: 88  Resp: 20   General Examination: The patient is a very pleasant 54 y.o. male in no acute distress. He appears well-developed and well-nourished and well groomed.   HEENT: Normocephalic, atraumatic, pupils are equal, round and reactive to light and accommodation. Funduscopic exam is normal with sharp disc margins noted. Extraocular tracking is good without limitation to gaze excursion or nystagmus noted. Normal smooth pursuit is noted. Hearing is grossly intact. Tympanic membranes are clear bilaterally. Face is symmetric with normal facial animation and normal facial sensation. Speech  is clear with no dysarthria noted. There is no hypophonia. There is no lip, neck/head, jaw or voice tremor. Neck is supple with full range of passive and active motion. There are no carotid bruits on auscultation. Oropharynx exam reveals: mild mouth dryness, adequate dental hygiene and marked airway crowding, due to larger tongue, thicker soft palate and larger uvula which appears slightly irritated and erythematous as well as swollen. Tonsils are in place, about 1+ bilaterally. Mallampati is class II. Neck circumference is 18-7/8 inches. Tongue protrudes centrally and palate elevates symmetrically.   Chest: Clear to auscultation without wheezing, rhonchi or crackles noted.  Heart: S1+S2+0, regular and  normal without murmurs, rubs or gallops noted.   Abdomen: Soft, non-tender and non-distended with normal bowel sounds appreciated on auscultation.  Extremities: There is no pitting edema in the distal lower extremities bilaterally. Pedal pulses are intact.  Skin: Warm and dry without trophic changes noted. There are no varicose veins.  Musculoskeletal: exam reveals no obvious joint deformities, tenderness or joint swelling or erythema.   Neurologically:  Mental status: The patient is awake, alert and oriented in all 4 spheres. His immediate and remote memory, attention, language skills and fund of knowledge are appropriate. There is no evidence of aphasia, agnosia, apraxia or anomia. Speech is clear with normal prosody and enunciation. Thought process is linear. Mood is normal and affect is blunted.  Cranial nerves II - XII are as described above under HEENT exam. In addition: shoulder shrug is normal with equal shoulder height noted. Motor exam: Normal bulk, strength and tone is noted. There is no drift, tremor or rebound. Romberg is negative. Reflexes are 1+ throughout. Fine motor skills and coordination: intact with normal finger taps, normal hand movements, normal rapid alternating patting, normal  foot taps and normal foot agility.  Cerebellar testing: No dysmetria or intention tremor on finger to nose testing. Heel to shin is unremarkable bilaterally. There is no truncal or gait ataxia.  Sensory exam: intact to light touch in the upper and lower extremities.  Gait, station and balance: He stands easily. No veering to one side is noted. No leaning to one side is noted. Posture is age-appropriate and stance is narrow based. Gait shows normal stride length and normal pace. No problems turning are noted. He turns en bloc. Tandem walk is unremarkable.  Assessment and Plan:    In summary, Sricharan Lacomb is a very pleasant 54 y.o.-year old male  with an underlying medical history of hypertension, smoking, asthma, allergies, and obesity, who was previously diagnosed with obstructive sleep apnea in 2007 and placed on CPAP therapy. He has not been fully compliant with treatment. He had another sleep study in 2011 but his CPAP therapy was not as just it at the time as I understand. He has had some interim weight gain.  I had a long chat with the patient about my findings and the diagnosis of OSA, its prognosis and treatment options. We talked about medical treatments, surgical interventions and non-pharmacological approaches. I explained in particular the risks and ramifications of untreated moderate to severe OSA, especially with respect to developing cardiovascular disease down the Road, including congestive heart failure, difficult to treat hypertension, cardiac arrhythmias, or stroke. Even type 2 diabetes has, in part, been linked to untreated OSA. Symptoms of untreated OSA include daytime sleepiness, memory problems, mood irritability and mood disorder such as depression and anxiety, lack of energy, as well as recurrent headaches, especially morning headaches. We talked about smoking cessation and trying to maintain a healthy lifestyle in general, as well as the importance of weight control. I encouraged  the patient to eat healthy, exercise daily and keep well hydrated, to keep a scheduled bedtime and wake time routine, to not skip any meals and eat healthy snacks in between meals. I advised the patient not to drive when feeling sleepy. I recommended the following at this time: sleep study with potential positive airway pressure titration. (We will score hypopneas at 3 and split the sleep study into diagnostic and treatment portion, if the estimated. 2 hour AHI is >15/hour).   I explained the sleep test procedure to the patient and  also outlined possible surgical and non-surgical treatment options of OSA, including the use of a custom-made dental device (which would require a referral to a specialist dentist or oral surgeon), upper airway surgical options, such as pillar implants, radiofrequency surgery, tongue base surgery, and UPPP (which would involve a referral to an ENT surgeon). Rarely, jaw surgery such as mandibular advancement may be considered.  I also explained the CPAP treatment option to the patient, who indicated that he would be willing to continue with CPAP therapy. I did explain to the patient the importance of being compliant with CPAP treatment, not only for insurance purposes but primarily to improve His symptoms, and for the patient's long term health benefit, including to reduce His cardiovascular risks. I answered all his questions today and the patient was in agreement. I would like to see him back after the sleep study is completed and encouraged him to call with any interim questions, concerns, problems or updates.   Thank you very much for allowing me to participate in the care of this nice patient. If I can be of any further assistance to you please do not hesitate to call me at [phone].  Sincerely,   Huston Foley, MD, PhD

## 2015-04-30 ENCOUNTER — Ambulatory Visit (INDEPENDENT_AMBULATORY_CARE_PROVIDER_SITE_OTHER): Payer: Federal, State, Local not specified - PPO | Admitting: Family Medicine

## 2015-04-30 VITALS — BP 140/82 | HR 77 | Temp 98.0°F | Resp 16 | Ht 70.5 in | Wt 263.0 lb

## 2015-04-30 DIAGNOSIS — G44229 Chronic tension-type headache, not intractable: Secondary | ICD-10-CM

## 2015-04-30 DIAGNOSIS — I10 Essential (primary) hypertension: Secondary | ICD-10-CM | POA: Diagnosis not present

## 2015-04-30 DIAGNOSIS — R030 Elevated blood-pressure reading, without diagnosis of hypertension: Secondary | ICD-10-CM | POA: Diagnosis not present

## 2015-04-30 DIAGNOSIS — IMO0001 Reserved for inherently not codable concepts without codable children: Secondary | ICD-10-CM

## 2015-04-30 NOTE — Progress Notes (Addendum)
Subjective:  This chart was scribed for Elvina Sidle MD, by Veverly Fells, at [facility].  This patient was seen in room 11 and the patient's care was started at 1:10 PM.     Patient ID: Dylan Washington, male    DOB: 02-Mar-1961, 54 y.o.   MRN: [id number] Chief Complaint  Patient presents with   Follow-up    Hypertension    HPI  HPI Comments: Dylan Washington is a 54 y.o. male who presents to the [facility] for a follow up regarding his blood pressure.  Patient states that he feels like he is getting better and stronger each and every day. He has not had pain in the back of his head for the last two days.  He feels that he is ready to go back to work.  He feels like staying at home and not working is a part of the issue ( he has been out of work since August 11th).  He states that his work is not too strenuous but thinks that he may need some kind of limitation during the holiday hours.  Patient recently had a sleep study ordered and will be getting one in November.  He really thinks that his 3-4 hours of sleep at night is contributing to his blood pressure issues. He is starting to walk again for exercise.     Patient Active Problem List   Diagnosis Date Noted   SOB (shortness of breath) on exertion 04/02/2014   RBBB (right bundle branch block) 04/02/2014   Chronic frontal sinusitis 04/02/2014   Asthma 01/17/2012   Environmental allergies 08/16/2011   Hypertension    Sleep apnea    Past Medical History  Diagnosis Date   Hypertension    Sleep apnea    Asthma    Allergy     allergy shots at [facility]   Past Surgical History  Procedure Laterality Date   Achilles tendon repair     No Known Allergies Prior to Admission medications   Medication Sig Start Date End Date Taking? Authorizing Provider  amLODipine (NORVASC) 5 MG tablet Take 1 tablet (5 mg total) by mouth daily. 10/07/13  Yes Elvina Sidle, MD    cetirizine (ZYRTEC) 10 MG tablet Take 1 tablet (10 mg total) by mouth daily. 03/17/13  Yes Elvina Sidle, MD  fluticasone (FLONASE) 50 MCG/ACT nasal spray Place 2 sprays into both nostrils daily. 04/10/15  Yes Elvina Sidle, MD  losartan-hydrochlorothiazide (HYZAAR) 100-25 MG per tablet TAKE 1 TABLET BY MOUTH DAILY  "NO MORE REFILLS WITHOUT OV" 03/17/15  Yes Chelle Jeffery, PA-C  Multiple Vitamins-Minerals (ADEKS) chewable tablet Chew 1 tablet by mouth daily.   Yes Historical Provider, MD   Social History   Social History   Marital Status: Single    Spouse Name: N/A   Number of Children: 1   Years of Education: college   Occupational History    [place] [employer]   Social History Main Topics   Smoking status: Current Every Day Smoker   Smokeless tobacco: Not on file   Alcohol Use: No   Drug Use: No   Sexual Activity: Yes   Other Topics Concern   Not on file   Social History Narrative   Marital status: married;       Children: none      Lives: with wife.      Employment: works at [employer]; Solicitor.      Tobacco: none  Alcohol: none      Drugs: none      Exercise: gym 4-5 days per week.   Denies  Caffeine use         Review of Systems  Constitutional: Negative for fever and chills.  Eyes: Negative for pain, redness and itching.  Respiratory: Negative for cough and choking.   Gastrointestinal: Negative for nausea and vomiting.  Musculoskeletal: Negative for neck pain.  Neurological: Negative for speech difficulty and headaches.       Objective:   Physical Exam  Constitutional: He is oriented to person, place, and time. He appears well-developed and well-nourished. No distress.  HENT:  Head: Normocephalic and atraumatic.  Eyes: Pupils are equal, round, and reactive to light.  Neck: Normal range of motion.  Pulmonary/Chest: No respiratory distress.  Neurological: He is alert and oriented to person, place, and time.  Skin: Skin is warm and dry.     Filed Vitals:   04/30/15 1254  BP: 140/82  Pulse: 77  Temp: 98 F (36.7 C)  TempSrc: Oral  Resp: 16  Height: 5' 10.5" (1.791 m)  Weight: 263 lb (119.296 kg)  SpO2: 98%         Assessment & Plan:  This chart was scribed in my presence and reviewed by me personally.    ICD-9-CM ICD-10-CM   1. Elevated BP 796.2 R03.0   2. Chronic tension-type headache, not intractable 339.12 G44.229   3. Essential hypertension 401.9 I10    Patient now seems to have his blood pressure under control. I think he can go back to work full-time in a written note to that effect. Were planning a follow-up in one month  Signed, [name], MD

## 2015-05-26 ENCOUNTER — Other Ambulatory Visit: Payer: Self-pay | Admitting: Physician Assistant

## 2015-06-15 ENCOUNTER — Ambulatory Visit (INDEPENDENT_AMBULATORY_CARE_PROVIDER_SITE_OTHER): Payer: Federal, State, Local not specified - PPO | Admitting: Family Medicine

## 2015-06-15 VITALS — BP 122/84 | HR 68 | Temp 98.4°F | Resp 16 | Ht 70.5 in | Wt 265.2 lb

## 2015-06-15 DIAGNOSIS — R109 Unspecified abdominal pain: Secondary | ICD-10-CM

## 2015-06-15 LAB — COMPLETE METABOLIC PANEL WITH GFR
ALT: 29 U/L (ref 9–46)
AST: 20 U/L (ref 10–35)
Albumin: 4.4 g/dL (ref 3.6–5.1)
Alkaline Phosphatase: 48 U/L (ref 40–115)
BUN: 12 mg/dL (ref 7–25)
CO2: 29 mmol/L (ref 20–31)
Calcium: 9.4 mg/dL (ref 8.6–10.3)
Chloride: 105 mmol/L (ref 98–110)
Creat: 1.03 mg/dL (ref 0.70–1.33)
GFR, Est African American: >89 mL/min (ref >=60)
GFR, Est Non African American: 83 mL/min (ref >=60)
Glucose, Bld: 91 mg/dL (ref 65–99)
Potassium: 4.5 mmol/L (ref 3.5–5.3)
Sodium: 138 mmol/L (ref 135–146)
Total Bilirubin: 1.1 mg/dL (ref 0.2–1.2)
Total Protein: 6.9 g/dL (ref 6.1–8.1)

## 2015-06-15 LAB — POCT URINALYSIS DIP (MANUAL ENTRY)
Bilirubin, UA: NEGATIVE
Blood, UA: NEGATIVE
Glucose, UA: NEGATIVE
Leukocytes, UA: NEGATIVE
Nitrite, UA: NEGATIVE
Protein Ur, POC: 30 — AB
Spec Grav, UA: 1.015
Urobilinogen, UA: 1
pH, UA: 8.5

## 2015-06-15 LAB — POC MICROSCOPIC URINALYSIS (UMFC): Mucus: ABSENT

## 2015-06-15 LAB — GLUCOSE, POCT (MANUAL RESULT ENTRY): POC Glucose: 95 mg/dl (ref 70–99)

## 2015-06-15 MED ORDER — CYCLOBENZAPRINE HCL 5 MG PO TABS: 5.0000 mg | ORAL_TABLET | Freq: Three times a day (TID) | ORAL | Status: DC | PRN

## 2015-06-15 NOTE — Progress Notes (Signed)
 @  By signing my name below, I, Raven Small, attest that this documentation has been prepared under the direction and in the presence of Elvina Sidle, MD.  Electronically Signed: Andrew Au, ED Scribe. 06/15/2015. 1:22 PM.  Patient ID: Dylan Washington MRN: [id number], DOB: 1961/02/05, 54 y.o. Date of Encounter: 06/15/2015, 1:22 PM  Primary Physician: Elvina Sidle, MD  Chief Complaint:  Chief Complaint  Patient presents with  . Flank Pain    he thinks his kidneys are caugin him pain    HPI: 54 y.o. year old male with history below presents with right flank that began 3 days ago. Pt has not been back to work. He was planning to do so but began to have right flank pain that he suspects is coming from his kidney. He initially had bilateral low back pain but now pain is only to right side. He has pain with certain movement, such as bending forward and twisting. He has had increased urinary frequency.   States his sleep has improved. Initially would wake up every 2 hours but now wakes up every 3-4 hours. Pt never went to sleep study.    Past Medical History  Diagnosis Date  . Hypertension   . Sleep apnea   . Asthma   . Allergy     allergy shots at [facility]     Home Meds: Prior to Admission medications   Medication Sig Start Date End Date Taking? Authorizing Provider  amLODipine (NORVASC) 5 MG tablet Take 1 tablet (5 mg total) by mouth daily. 10/07/13  Yes Elvina Sidle, MD  cetirizine (ZYRTEC) 10 MG tablet Take 1 tablet (10 mg total) by mouth daily. 03/17/13  Yes Elvina Sidle, MD  losartan-hydrochlorothiazide (HYZAAR) 100-25 MG tablet TAKE 1 TABLET BY MOUTH EVERY DAY 05/26/15  Yes Chelle Jeffery, PA-C  Multiple Vitamins-Minerals (ADEKS) chewable tablet Chew 1 tablet by mouth daily.   Yes Historical Provider, MD  fluticasone (FLONASE) 50 MCG/ACT nasal spray Place 2 sprays into both nostrils daily. Patient not taking: Reported on 06/15/2015 04/10/15   Elvina Sidle, MD    Allergies: No Known Allergies  Social History   Social History  . Marital Status: Single    Spouse Name: N/A  . Number of Children: 1  . Years of Education: college   Occupational History  .  [employer]   Social History Main Topics  . Smoking status: Current Every Day Smoker  . Smokeless tobacco: Not on file  . Alcohol Use: No  . Drug Use: No  . Sexual Activity: Yes   Other Topics Concern  . Not on file   Social History Narrative   Marital status: married;       Children: none      Lives: with wife.      Employment: works at [employer]; Solicitor.      Tobacco: none      Alcohol: none      Drugs: none      Exercise: gym 4-5 days per week.   Denies  Caffeine use      Review of Systems: Constitutional: negative for chills, fever, night sweats, weight changes, or fatigue  HEENT: negative for vision changes, hearing loss, congestion, rhinorrhea, ST, epistaxis, or sinus pressure Cardiovascular: negative for chest pain or palpitations Respiratory: negative for hemoptysis, wheezing, shortness of breath, or cough Abdominal: negative for abdominal pain, nausea, vomiting, diarrhea, or constipation Dermatological: negative for rash Neurologic: negative for headache, dizziness, or syncope All other systems reviewed and  are otherwise negative with the exception to those above and in the HPI.   Physical Exam: Blood pressure 122/84, pulse 68, temperature 98.4 F (36.9 C), temperature source Oral, resp. rate 16, height 5' 10.5" (1.791 m), weight 265 lb 3.2 oz (120.294 kg), SpO2 98 %., Body mass index is 37.5 kg/(m^2). General: Well developed, well nourished, in no acute distress. Head: Normocephalic, atraumatic, eyes without discharge, sclera non-icteric, nares are without discharge. Bilateral auditory canals clear, TM's are without perforation Oral cavity moist. Neck: Supple. No thyromegaly. Full ROM. No lymphadenopathy. Lungs: Clear bilaterally to  auscultation without wheezes, rales, or rhonchi. Breathing is unlabored. Heart: RRR with S1 S2. No murmurs, rubs, or gallops appreciated.  Msk:  Strength and tone normal for age.  Mild tenderness in right flank. Good ROM. Extremities/Skin: Warm and dry. No clubbing or cyanosis. No edema. No rashes or suspicious lesions. Neuro: Alert and oriented X 3. Moves all extremities spontaneously. Gait is normal. CNII-XII grossly in tact. Psych:  Responds to questions appropriately with a normal affect.   Labs: Results for orders placed or performed in visit on 06/15/15  POCT urinalysis dipstick  Result Value Ref Range   Color, UA yellow yellow   Clarity, UA clear clear   Glucose, UA negative negative   Bilirubin, UA negative negative   Ketones, POC UA trace (5) (A) negative   Spec Grav, UA 1.015    Blood, UA negative negative   pH, UA 8.5    Protein Ur, POC =30 (A) negative   Urobilinogen, UA 1.0    Nitrite, UA Negative Negative   Leukocytes, UA Negative Negative  POCT Microscopic Urinalysis ([facility])  Result Value Ref Range   WBC,UR,HPF,POC None None WBC/hpf   RBC,UR,HPF,POC None None RBC/hpf   Bacteria None None, Too numerous to count   Mucus Absent Absent   Epithelial Cells, UR Per Microscopy None None, Too numerous to count cells/hpf  POCT glucose (manual entry)  Result Value Ref Range   POC Glucose 95 70 - 99 mg/dl      ASSESSMENT AND PLAN:  54 y.o. year old male with flank pain, right sided, most typical for muscle strain This chart was scribed in my presence and reviewed by me personally.    ICD-9-CM ICD-10-CM   1. Right flank pain 789.09 R10.9 POCT urinalysis dipstick     POCT Microscopic Urinalysis ([facility])     POCT glucose (manual entry)     COMPLETE METABOLIC PANEL WITH GFR     cyclobenzaprine (FLEXERIL) 5 MG tablet    Signed, Elvina SidleKurt Kataryna Mcquilkin, MD 06/15/2015 1:22 PM

## 2015-06-15 NOTE — Patient Instructions (Signed)
Dylan Washington, I believe this is simply muscular strain. I think once it resolves you be able to go back to work. Just let me know and I'll write the note for return to work. I think it will take to several days.

## 2015-06-25 ENCOUNTER — Ambulatory Visit (INDEPENDENT_AMBULATORY_CARE_PROVIDER_SITE_OTHER): Payer: Federal, State, Local not specified - PPO | Admitting: Family Medicine

## 2015-06-25 VITALS — BP 132/80 | HR 81 | Temp 98.6°F | Resp 18 | Ht 70.5 in | Wt 266.0 lb

## 2015-06-25 DIAGNOSIS — N522 Drug-induced erectile dysfunction: Secondary | ICD-10-CM

## 2015-06-25 DIAGNOSIS — R109 Unspecified abdominal pain: Secondary | ICD-10-CM | POA: Diagnosis not present

## 2015-06-25 MED ORDER — TADALAFIL 20 MG PO TABS: 10.0000 mg | ORAL_TABLET | ORAL | Status: DC | PRN

## 2015-06-25 NOTE — Patient Instructions (Signed)
Return as needed for the flank pain

## 2015-06-25 NOTE — Progress Notes (Signed)
By signing my name below, I, Rawaa Al Rifaie, attest that this documentation has been prepared under the direction and in the presence of Elvina Sidle, MD.  Watt Climes Rifaie, Medical Scribe. 06/25/2015.  11:35 AM.  Patient ID: Dylan Washington MRN: [id number], DOB: 1960/08/24, 54 y.o. Date of Encounter: 06/25/2015  Primary Physician: Elvina Sidle, MD  Chief Complaint:  Chief Complaint  Patient presents with  . Follow-up    2 weeks    HPI:  Dylan Washington is a 54 y.o. male who presents to [facility] for a follow up regarding his right flank pain.  Pt was sen here in the office by me on 12/05 for right flank pain. Pt was prescribed Flexeril 5 mg.  Today, Pt notes that his symptoms are milder than previously, and indicates that the pain is more intermittent and has shorter durations. Pt would like to get back to work on Monday.   Pt does not exercise regularly.   Pt works in [place] for [employer], and plans to move there by the summer.    Past Medical History  Diagnosis Date  . Hypertension   . Sleep apnea   . Asthma   . Allergy     allergy shots at [facility]     Home Meds: Prior to Admission medications   Medication Sig Start Date End Date Taking? Authorizing Provider  amLODipine (NORVASC) 5 MG tablet Take 1 tablet (5 mg total) by mouth daily. 10/07/13  Yes Elvina Sidle, MD  cetirizine (ZYRTEC) 10 MG tablet Take 1 tablet (10 mg total) by mouth daily. 03/17/13  Yes Elvina Sidle, MD  cyclobenzaprine (FLEXERIL) 5 MG tablet Take 1 tablet (5 mg total) by mouth 3 (three) times daily as needed for muscle spasms. 06/15/15  Yes Elvina Sidle, MD  losartan-hydrochlorothiazide (HYZAAR) 100-25 MG tablet TAKE 1 TABLET BY MOUTH EVERY DAY 05/26/15  Yes Chelle Jeffery, PA-C  Multiple Vitamins-Minerals (ADEKS) chewable tablet Chew 1 tablet by mouth daily.   Yes Historical Provider, MD  fluticasone (FLONASE) 50 MCG/ACT nasal spray Place 2 sprays into both  nostrils daily. Patient not taking: Reported on 06/15/2015 04/10/15   Elvina Sidle, MD    Allergies: No Known Allergies  Social History   Social History  . Marital Status: Single    Spouse Name: N/A  . Number of Children: 1  . Years of Education: college   Occupational History  .  [employer]   Social History Main Topics  . Smoking status: Current Every Day Smoker  . Smokeless tobacco: Not on file  . Alcohol Use: No  . Drug Use: No  . Sexual Activity: Yes   Other Topics Concern  . Not on file   Social History Narrative   Marital status: married;       Children: none      Lives: with wife.      Employment: works at [employer]; Solicitor.      Tobacco: none      Alcohol: none      Drugs: none      Exercise: gym 4-5 days per week.   Denies  Caffeine use      Review of Systems: Constitutional: negative for chills, fever, night sweats, weight changes, or fatigue  HEENT: negative for vision changes, hearing loss, congestion, rhinorrhea, ST, epistaxis, or sinus pressure Cardiovascular: negative for chest pain or palpitations Respiratory: negative for hemoptysis, wheezing, shortness of breath, or cough Abdominal: negative for abdominal pain, nausea, vomiting, diarrhea,  or constipation. Positive for flank pain.  Dermatological: negative for rash Neurologic: negative for headache, dizziness, or syncope All other systems reviewed and are otherwise negative with the exception to those above and in the HPI.  Physical Exam: Blood pressure 132/80, pulse 81, temperature 98.6 F (37 C), temperature source Oral, resp. rate 18, height 5' 10.5" (1.791 m), weight 266 lb (120.657 kg), SpO2 97 %., Body mass index is 37.62 kg/(m^2). General: Well developed, well nourished, in no acute distress. Head: Normocephalic, atraumatic, eyes without discharge, sclera non-icteric, nares are without discharge. Bilateral auditory canals clear, TM's are without perforation, pearly grey and  translucent with reflective cone of light bilaterally. Oral cavity moist, posterior pharynx without exudate, erythema, peritonsillar abscess, or post nasal drip.  Neck: Supple. No thyromegaly. Full ROM. No lymphadenopathy. Lungs: Clear bilaterally to auscultation without wheezes, rales, or rhonchi. Breathing is unlabored. Heart: RRR with S1 S2. No murmurs, rubs, or gallops appreciated. Abdomen: Soft, non-tender, non-distended with normoactive bowel sounds. No hepatomegaly. No rebound/guarding. No obvious abdominal masses. Msk:  Strength and tone normal for age. Extremities/Skin: Warm and dry. No clubbing or cyanosis. No edema. No rashes or suspicious lesions. Neuro: Alert and oriented X 3. Moves all extremities spontaneously. Gait is normal. CNII-XII grossly in tact. Psych:  Responds to questions appropriately with a normal affect.    ASSESSMENT AND PLAN:  54 y.o. year old male with flank strain   This chart was scribed in my presence and reviewed by me personally.    ICD-9-CM ICD-10-CM   1. Right flank pain 789.09 R10.9   2. Drug-induced erectile dysfunction 607.84 N52.2 tadalafil (CIALIS) 20 MG tablet   E980.5      Back to work next week, encouraged to get exercise  Signed, Elvina SidleKurt Damek Ende, MD 06/25/2015 11:27 AM

## 2015-07-15 ENCOUNTER — Telehealth: Payer: Self-pay

## 2015-07-15 NOTE — Telephone Encounter (Signed)
Left message for pt to call back  °

## 2015-07-15 NOTE — Telephone Encounter (Signed)
From Dr. Loma BostonLauenstein's note 06/25/2015: Back to work next week, encouraged to get exercise  If he needs a note now, then he'll need to return for re-evaluation. It was not the plan to be out of work this long.

## 2015-07-15 NOTE — Telephone Encounter (Signed)
Patient is requesting a note to return to work tomorrow morning.   He does not want to wait for Dr. Elbert EwingsL to RTC.   [phone]

## 2015-07-15 NOTE — Telephone Encounter (Signed)
He still needs follow up? RTC to see another provider? Please advise.

## 2015-07-16 ENCOUNTER — Ambulatory Visit (INDEPENDENT_AMBULATORY_CARE_PROVIDER_SITE_OTHER): Payer: Federal, State, Local not specified - PPO | Admitting: Family Medicine

## 2015-07-16 VITALS — BP 148/90 | HR 93 | Temp 98.3°F | Resp 20 | Ht 71.0 in | Wt 270.8 lb

## 2015-07-16 DIAGNOSIS — R351 Nocturia: Secondary | ICD-10-CM | POA: Diagnosis not present

## 2015-07-16 DIAGNOSIS — R350 Frequency of micturition: Secondary | ICD-10-CM

## 2015-07-16 DIAGNOSIS — M79604 Pain in right leg: Secondary | ICD-10-CM

## 2015-07-16 LAB — POCT URINALYSIS DIP (MANUAL ENTRY)
BILIRUBIN UA: NEGATIVE
BILIRUBIN UA: NEGATIVE
Blood, UA: NEGATIVE
GLUCOSE UA: NEGATIVE
LEUKOCYTES UA: NEGATIVE
Nitrite, UA: NEGATIVE
PH UA: 7.5
Protein Ur, POC: NEGATIVE
Spec Grav, UA: 1.02
Urobilinogen, UA: 1

## 2015-07-16 LAB — POC MICROSCOPIC URINALYSIS (UMFC): MUCUS RE: ABSENT

## 2015-07-16 NOTE — Progress Notes (Addendum)
Subjective:    Patient ID: Dylan Washington, male    DOB: 1961-07-04, 55 y.o.   MRN: [id number] By signing my name below, I, Dylan Washington, attest that this documentation has been prepared under the direction and in the presence of Meredith StaggersJeffrey Lylianna Fraiser, MD. Electronically Signed: Javier Dockerobert Ryan Washington, ER Scribe. 07/16/2015. 2:54 PM.  HPI HPI Comments: Dylan RockersMarty Sabo is a 55 y.o. male who [facility] follow up for right flank pain. His last visit was 06/15/2015. He was treated with flexeril at that visit. He was advised to return to work the following week and start exercising. He is here for follow up and has requested a work note. He had a urinalysis on the 5th without blood.   He states after his last visit he started having pain that radiated down through his right hip into his thigh to his foot with associated numbness. That intermittent pain and numbness has since improved, though it is not completely resolved. He endorses associated intermittent numbness in his right leg. He not taken any medications for the last week.  He is also complaining of significant urinary urgency and has come close to urinating on himself. He has had no true urinary or bowel incontinence. He is waking up multiple times per night to urinate. He denies pain when he has a bowel movement.   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   Patient Active Problem List   Diagnosis Date Noted  . RBBB (right bundle branch block) 04/02/2014  . Asthma 01/17/2012  . Environmental allergies 08/16/2011  . Hypertension   . Sleep apnea    Past Medical History  Diagnosis Date  . Hypertension   . Sleep apnea   . Asthma   . Allergy     [facility]   Past Surgical History  Procedure Laterality Date  . Achilles tendon repair     No Known Allergies Prior to Admission medications   Medication Sig Start Date End Date Taking? Authorizing Provider  amLODipine (NORVASC) 5  MG tablet Take 1 tablet (5 mg total) by mouth daily. 10/07/13  Yes [name], MD  cetirizine (ZYRTEC) 10 MG tablet Take 1 tablet (10 mg total) by mouth daily. 03/17/13  Yes [name], MD  cyclobenzaprine (FLEXERIL) 5 MG tablet Take 1 tablet (5 mg total) by mouth 3 (three) times daily as needed for muscle spasms. 06/15/15  Yes [name], MD  losartan-hydrochlorothiazide (HYZAAR) 100-25 MG tablet TAKE 1 TABLET BY MOUTH EVERY DAY 05/26/15  Yes Chelle Jeffery, PA-C  Multiple Vitamins-Minerals (ADEKS) chewable tablet Chew 1 tablet by mouth daily.   Yes Historical Provider, MD  tadalafil (CIALIS) 20 MG tablet Take 0.5-1 tablets (10-20 mg total) by mouth every other day as needed for erectile dysfunction. 06/25/15  Yes [name], MD  fluticasone [facility](FLONASE) 50 MCG/ACT nasal spray Place 2 sprays into both nostrils daily. Patient not taking: Reported on 07/16/2015 04/10/15   [name], MD   Social History   Social History  . Marital Status: Single    Spouse Name: N/A  . Number of Children: 1  . Years of Education: college   Occupational History  .  [employer]   Social History Main Topics  . Smoking status: Current Every Day Smoker  . Smokeless tobacco: Not on file  . Alcohol Use: No  . Drug Use: No  . Sexual Activity: Yes   Other Topics Concern  .  Not on file   Social History Narrative   Marital status: married;       Children: none      Lives: with wife.      Employment: works at [employer]; Solicitor.      Tobacco: none      Alcohol: none      Drugs: none      Exercise: gym 4-5 days per week.   Denies  Caffeine use     Review of Systems  Constitutional: Negative for fever and chills.  Musculoskeletal: Positive for back pain. Negative for arthralgias.  Neurological: Positive for weakness and numbness.      Objective:  BP 148/90 mmHg  Pulse 93  Temp(Src) 98.3 F (36.8 C) (Oral)  Resp 20  Ht 5\' 11"  (1.803 m)  Wt 270 lb 12.8 oz (122.834 kg)  BMI  37.79 kg/m2  SpO2 99%  Physical Exam  Constitutional: He is oriented to person, place, and time. He appears well-developed and well-nourished. No distress.  HENT:  Head: Normocephalic and atraumatic.  Eyes: Pupils are equal, round, and reactive to light.  Neck: Neck supple.  Cardiovascular: Normal rate.   Pulmonary/Chest: Effort normal. No respiratory distress.  Musculoskeletal: Normal range of motion.  Back is non tender. Able to heel and toe walk without difficulty. Previous location of pain was in the low back paraspinals on the right. Sciatic notch and SI joint non tender. Negative seated straight leg raise. Reflexes 2+ LE's bilaterally.    Neurological: He is alert and oriented to person, place, and time. Coordination normal.  Skin: Skin is warm and dry. He is not diaphoretic.  Psychiatric: He has a normal mood and affect. His behavior is normal.  Nursing note and vitals reviewed.  Results for orders placed or performed in visit on 07/16/15  POCT urinalysis dipstick  Result Value Ref Range   Color, UA yellow yellow   Clarity, UA clear clear   Glucose, UA negative negative   Bilirubin, UA negative negative   Ketones, POC UA negative negative   Spec Grav, UA 1.020    Blood, UA negative negative   pH, UA 7.5    Protein Ur, POC negative negative   Urobilinogen, UA 1.0    Nitrite, UA Negative Negative   Leukocytes, UA Negative Negative  POCT Microscopic Urinalysis ([facility])  Result Value Ref Range   WBC,UR,HPF,POC None None WBC/hpf   RBC,UR,HPF,POC None None RBC/hpf   Bacteria None None, Too numerous to count   Mucus Absent Absent   Epithelial Cells, UR Per Microscopy Few (A) None, Too numerous to count cells/hpf        Assessment & Plan:   Marshell Washington is a 56 y.o. male Urinary frequency - Plan: POCT urinalysis dipstick, POCT Microscopic Urinalysis ([facility]), PSA Nocturia - Plan: POCT urinalysis dipstick, POCT Microscopic Urinalysis ([facility]), PSA  -  Urinalysis in office  overall reassuring. He declined digital rectal exam, but PSA was obtained. If this is elevated, treat for acute prostatitis. If normal PSA, and symptoms persist, return here or other provider where he lives to discuss further evaluation or possible urology evaluation.  Right leg pain  -  Probable sciatica, and this may have been the initial cause of his right flank/lower back pain. Overall he is improved, and would like to return to work.   -note provide to return to work, can try over-the-counter Tylenol and range of motion, stretches throughout the day.  - If he is having increasing or worsening pain  with return to full duty, recommended return for repeat evaluation and x-rays.  No orders of the defined types were placed in this encounter.   Patient Instructions  Your right leg pain may have been due to sciatica. See information on this below. As you are improving now, can try Tylenol over-the-counter as needed, range of motion and stretches throughout the day, try return to work full duty. However if the pain increases, or you're having difficulty returning to full duty,  return here or other provider for further evaluation and possible x-rays of your lumbar spine. Return to the clinic or go to the nearest emergency room if any of your symptoms worsen or new symptoms occur.   I will check a prostate test for the problems with frequent urination, but if this worsens, return here or other provider for prostate exam as we discussed. Try to avoid caffeinated beverages close to bedtime.  Sciatica Sciatica is pain, weakness, numbness, or tingling along the path of the sciatic nerve. The nerve starts in the lower back and runs down the back of each leg. The nerve controls the muscles in the lower leg and in the back of the knee, while also providing sensation to the back of the thigh, lower leg, and the sole of your foot. Sciatica is a symptom of another medical condition. For instance, nerve damage or  certain conditions, such as a herniated disk or bone spur on the spine, pinch or put pressure on the sciatic nerve. This causes the pain, weakness, or other sensations normally associated with sciatica. Generally, sciatica only affects one side of the body. CAUSES   Herniated or slipped disc.  Degenerative disk disease.  A pain disorder involving the narrow muscle in the buttocks (piriformis syndrome).  Pelvic injury or fracture.  Pregnancy.  Tumor (rare). SYMPTOMS  Symptoms can vary from mild to very severe. The symptoms usually travel from the low back to the buttocks and down the back of the leg. Symptoms can include:  Mild tingling or dull aches in the lower back, leg, or hip.  Numbness in the back of the calf or sole of the foot.  Burning sensations in the lower back, leg, or hip.  Sharp pains in the lower back, leg, or hip.  Leg weakness.  Severe back pain inhibiting movement. These symptoms may get worse with coughing, sneezing, laughing, or prolonged sitting or standing. Also, being overweight may worsen symptoms. DIAGNOSIS  Your caregiver will perform a physical exam to look for common symptoms of sciatica. He or she may ask you to do certain movements or activities that would trigger sciatic nerve pain. Other tests may be performed to find the cause of the sciatica. These may include:  Blood tests.  X-rays.  Imaging tests, such as an MRI or CT scan. TREATMENT  Treatment is directed at the cause of the sciatic pain. Sometimes, treatment is not necessary and the pain and discomfort goes away on its own. If treatment is needed, your caregiver may suggest:  Over-the-counter medicines to relieve pain.  Prescription medicines, such as anti-inflammatory medicine, muscle relaxants, or narcotics.  Applying heat or ice to the painful area.  Steroid injections to lessen pain, irritation, and inflammation around the nerve.  Reducing activity during periods of  pain.  Exercising and stretching to strengthen your abdomen and improve flexibility of your spine. Your caregiver may suggest losing weight if the extra weight makes the back pain worse.  Physical therapy.  Surgery to eliminate what is  pressing or pinching the nerve, such as a bone spur or part of a herniated disk. HOME CARE INSTRUCTIONS   Only take over-the-counter or prescription medicines for pain or discomfort as directed by your caregiver.  Apply ice to the affected area for 20 minutes, 3-4 times a day for the first 48-72 hours. Then try heat in the same way.  Exercise, stretch, or perform your usual activities if these do not aggravate your pain.  Attend physical therapy sessions as directed by your caregiver.  Keep all follow-up appointments as directed by your caregiver.  Do not wear high heels or shoes that do not provide proper support.  Check your mattress to see if it is too soft. A firm mattress may lessen your pain and discomfort. SEEK IMMEDIATE MEDICAL CARE IF:   You lose control of your bowel or bladder (incontinence).  You have increasing weakness in the lower back, pelvis, buttocks, or legs.  You have redness or swelling of your back.  You have a burning sensation when you urinate.  You have pain that gets worse when you lie down or awakens you at night.  Your pain is worse than you have experienced in the past.  Your pain is lasting longer than 4 weeks.  You are suddenly losing weight without reason. MAKE SURE YOU:  Understand these instructions.  Will watch your condition.  Will get help right away if you are not doing well or get worse.   This information is not intended to replace advice given to you by your health care provider. Make sure you discuss any questions you have with your health care provider.   Document Released: 06/21/2001 Document Revised: 03/18/2015 Document Reviewed: 11/06/2011 Elsevier Interactive Patient Education 2016  [employer].   Urinary Frequency The number of times a normal person urinates depends upon how much liquid they take in and how much liquid they are losing. If the temperature is hot and there is high humidity, then the person will sweat more and usually breathe a little more frequently. These factors decrease the amount of frequency of urination that would be considered normal. The amount you drink is easily determined, but the amount of fluid lost is sometimes more difficult to calculate.  Fluid is lost in two ways:  Sensible fluid loss is usually measured by the amount of urine that you get rid of. Losses of fluid can also occur with diarrhea.  Insensible fluid loss is more difficult to measure. It is caused by evaporation. Insensible loss of fluid occurs through breathing and sweating. It usually ranges from a little less than a quart to a little more than a quart of fluid a day. In normal temperatures and activity levels, the average person may urinate 4 to 7 times in a 24-hour period. Needing to urinate more often than that could indicate a problem. If one urinates 4 to 7 times in 24 hours and has large volumes each time, that could indicate a different problem from one who urinates 4 to 7 times a day and has small volumes. The time of urinating is also important. Most urinating should be done during the waking hours. Getting up at night to urinate frequently can indicate some problems. CAUSES  The bladder is the organ in your lower abdomen that holds urine. Like a balloon, it swells some as it fills up. Your nerves sense this and tell you it is time to head for the bathroom. There are a number of reasons that you might  feel the need to urinate more often than usual. They include:  Urinary tract infection. This is usually associated with other signs such as burning when you urinate.  In men, problems with the prostate (a walnut-size gland that is located near the tube that carries urine out  of your body). There are two reasons why the prostate can cause an increased frequency of urination:  An enlarged prostate that does not let the bladder empty well. If the bladder only half empties when you urinate, then it only has half the capacity to fill before you have to urinate again.  The nerves in the bladder become more hypersensitive with an increased size of the prostate even if the bladder empties completely.  Pregnancy.  Obesity. Excess weight is more likely to cause a problem for women than for men.  Bladder stones or other bladder problems.  Caffeine.  Alcohol.  Medications. For example, drugs that help the body get rid of extra fluid (diuretics) increase urine production. Some other medicines must be taken with lots of fluids.  Muscle or nerve weakness. This might be the result of a spinal cord injury, a stroke, multiple sclerosis, or Parkinson disease.  Long-standing diabetes can decrease the sensation of the bladder. This loss of sensation makes it harder to sense the bladder needs to be emptied. Over a period of years, the bladder is stretched out by constant overfilling. This weakens the bladder muscles so that the bladder does not empty well and has less capacity to fill with new urine.  Interstitial cystitis (also called painful bladder syndrome). This condition develops because the tissues that line the inside of the bladder are inflamed (inflammation is the body's way of reacting to injury or infection). It causes pain and frequent urination. It occurs in women more often than in men. DIAGNOSIS   To decide what might be causing your urinary frequency, your health care provider will probably:  Ask about symptoms you have noticed.  Ask about your overall health. This will include questions about any medications you are taking.  Do a physical examination.  Order some tests. These might include:  A blood test to check for diabetes or other health issues that  could be contributing to the problem.  Urine testing. This could measure the flow of urine and the pressure on the bladder.  A test of your neurological system (the brain, spinal cord, and nerves). This is the system that senses the need to urinate.  A bladder test to check whether it is emptying completely when you urinate.  Cystoscopy. This test uses a thin tube with a tiny camera on it. It offers a look inside your urethra and bladder to see if there are problems.  Imaging tests. You might be given a contrast dye and then asked to urinate. X-rays are taken to see how your bladder is working. TREATMENT  It is important for you to be evaluated to determine if the amount or frequency that you have is unusual or abnormal. If it is found to be abnormal, the cause should be determined and this can usually be found out easily. Depending upon the cause, treatment could include medication, stimulation of the nerves, or surgery. There are not too many things that you can do as an individual to change your urinary frequency. It is important that you balance the amount of fluid intake needed to compensate for your activity and the temperature. Medical problems will be diagnosed and taken care of by your physician.  There is no particular bladder training such as Kegel exercises that you can do to help urinary frequency. This is an exercise that is usually recommended for people who have leaking of urine when they laugh, cough, or sneeze. HOME CARE INSTRUCTIONS   Take any medications your health care provider prescribed or suggested. Follow the directions carefully.  Practice any lifestyle changes that are recommended. These might include:  Drinking less fluid or drinking at different times of the day. If you need to urinate often during the night, for example, you may need to stop drinking fluids early in the evening.  Cutting down on caffeine or alcohol. They both can make you need to urinate more often  than normal. Caffeine is found in coffee, tea, and sodas.  Losing weight, if that is recommended.  Keep a journal or a log. You might be asked to record how much you drink and when and where you feel the need to urinate. This will also help evaluate how well the treatment provided by your physician is working. SEEK MEDICAL CARE IF:   Your need to urinate often gets worse.  You feel increased pain or irritation when you urinate.  You notice blood in your urine.  You have questions about any medications that your health care provider recommended.  You notice blood, pus, or swelling at the site of any test or treatment procedure.  You develop a fever of more than 100.6F (38.1C). SEEK IMMEDIATE MEDICAL CARE IF:  You develop a fever of more than 102.65F (38.9C).   This information is not intended to replace advice given to you by your health care provider. Make sure you discuss any questions you have with your health care provider.   Document Released: 04/23/2009 Document Revised: 07/18/2014 Document Reviewed: 04/23/2009 Elsevier Interactive Patient Education [employer]        I personally performed the services described in this documentation, which was scribed in my presence. The recorded information has been reviewed and considered, and addended by me as needed.

## 2015-07-16 NOTE — Patient Instructions (Signed)
Your right leg pain may have been due to sciatica. See information on this below. As you are improving now, can try Tylenol over-the-counter as needed, range of motion and stretches throughout the day, try return to work full duty. However if the pain increases, or you're having difficulty returning to full duty,  return here or other provider for further evaluation and possible x-rays of your lumbar spine. Return to the clinic or go to the nearest emergency room if any of your symptoms worsen or new symptoms occur.   I will check a prostate test for the problems with frequent urination, but if this worsens, return here or other provider for prostate exam as we discussed. Try to avoid caffeinated beverages close to bedtime.  Sciatica Sciatica is pain, weakness, numbness, or tingling along the path of the sciatic nerve. The nerve starts in the lower back and runs down the back of each leg. The nerve controls the muscles in the lower leg and in the back of the knee, while also providing sensation to the back of the thigh, lower leg, and the sole of your foot. Sciatica is a symptom of another medical condition. For instance, nerve damage or certain conditions, such as a herniated disk or bone spur on the spine, pinch or put pressure on the sciatic nerve. This causes the pain, weakness, or other sensations normally associated with sciatica. Generally, sciatica only affects one side of the body. CAUSES   Herniated or slipped disc.  Degenerative disk disease.  A pain disorder involving the narrow muscle in the buttocks (piriformis syndrome).  Pelvic injury or fracture.  Pregnancy.  Tumor (rare). SYMPTOMS  Symptoms can vary from mild to very severe. The symptoms usually travel from the low back to the buttocks and down the back of the leg. Symptoms can include:  Mild tingling or dull aches in the lower back, leg, or hip.  Numbness in the back of the calf or sole of the foot.  Burning sensations in  the lower back, leg, or hip.  Sharp pains in the lower back, leg, or hip.  Leg weakness.  Severe back pain inhibiting movement. These symptoms may get worse with coughing, sneezing, laughing, or prolonged sitting or standing. Also, being overweight may worsen symptoms. DIAGNOSIS  Your caregiver will perform a physical exam to look for common symptoms of sciatica. He or she may ask you to do certain movements or activities that would trigger sciatic nerve pain. Other tests may be performed to find the cause of the sciatica. These may include:  Blood tests.  X-rays.  Imaging tests, such as an MRI or CT scan. TREATMENT  Treatment is directed at the cause of the sciatic pain. Sometimes, treatment is not necessary and the pain and discomfort goes away on its own. If treatment is needed, your caregiver may suggest:  Over-the-counter medicines to relieve pain.  Prescription medicines, such as anti-inflammatory medicine, muscle relaxants, or narcotics.  Applying heat or ice to the painful area.  Steroid injections to lessen pain, irritation, and inflammation around the nerve.  Reducing activity during periods of pain.  Exercising and stretching to strengthen your abdomen and improve flexibility of your spine. Your caregiver may suggest losing weight if the extra weight makes the back pain worse.  Physical therapy.  Surgery to eliminate what is pressing or pinching the nerve, such as a bone spur or part of a herniated disk. HOME CARE INSTRUCTIONS   Only take over-the-counter or prescription medicines for pain or  discomfort as directed by your caregiver.  Apply ice to the affected area for 20 minutes, 3-4 times a day for the first 48-72 hours. Then try heat in the same way.  Exercise, stretch, or perform your usual activities if these do not aggravate your pain.  Attend physical therapy sessions as directed by your caregiver.  Keep all follow-up appointments as directed by your  caregiver.  Do not wear high heels or shoes that do not provide proper support.  Check your mattress to see if it is too soft. A firm mattress may lessen your pain and discomfort. SEEK IMMEDIATE MEDICAL CARE IF:   You lose control of your bowel or bladder (incontinence).  You have increasing weakness in the lower back, pelvis, buttocks, or legs.  You have redness or swelling of your back.  You have a burning sensation when you urinate.  You have pain that gets worse when you lie down or awakens you at night.  Your pain is worse than you have experienced in the past.  Your pain is lasting longer than 4 weeks.  You are suddenly losing weight without reason. MAKE SURE YOU:  Understand these instructions.  Will watch your condition.  Will get help right away if you are not doing well or get worse.   This information is not intended to replace advice given to you by your health care provider. Make sure you discuss any questions you have with your health care provider.   Document Released: 06/21/2001 Document Revised: 03/18/2015 Document Reviewed: 11/06/2011 Elsevier Interactive Patient Education 2016 [employer].   Urinary Frequency The number of times a normal person urinates depends upon how much liquid they take in and how much liquid they are losing. If the temperature is hot and there is high humidity, then the person will sweat more and usually breathe a little more frequently. These factors decrease the amount of frequency of urination that would be considered normal. The amount you drink is easily determined, but the amount of fluid lost is sometimes more difficult to calculate.  Fluid is lost in two ways:  Sensible fluid loss is usually measured by the amount of urine that you get rid of. Losses of fluid can also occur with diarrhea.  Insensible fluid loss is more difficult to measure. It is caused by evaporation. Insensible loss of fluid occurs through breathing and  sweating. It usually ranges from a little less than a quart to a little more than a quart of fluid a day. In normal temperatures and activity levels, the average person may urinate 4 to 7 times in a 24-hour period. Needing to urinate more often than that could indicate a problem. If one urinates 4 to 7 times in 24 hours and has large volumes each time, that could indicate a different problem from one who urinates 4 to 7 times a day and has small volumes. The time of urinating is also important. Most urinating should be done during the waking hours. Getting up at night to urinate frequently can indicate some problems. CAUSES  The bladder is the organ in your lower abdomen that holds urine. Like a balloon, it swells some as it fills up. Your nerves sense this and tell you it is time to head for the bathroom. There are a number of reasons that you might feel the need to urinate more often than usual. They include:  Urinary tract infection. This is usually associated with other signs such as burning when you urinate.  In men, problems with the prostate (a walnut-size gland that is located near the tube that carries urine out of your body). There are two reasons why the prostate can cause an increased frequency of urination:  An enlarged prostate that does not let the bladder empty well. If the bladder only half empties when you urinate, then it only has half the capacity to fill before you have to urinate again.  The nerves in the bladder become more hypersensitive with an increased size of the prostate even if the bladder empties completely.  Pregnancy.  Obesity. Excess weight is more likely to cause a problem for women than for men.  Bladder stones or other bladder problems.  Caffeine.  Alcohol.  Medications. For example, drugs that help the body get rid of extra fluid (diuretics) increase urine production. Some other medicines must be taken with lots of fluids.  Muscle or nerve weakness. This  might be the result of a spinal cord injury, a stroke, multiple sclerosis, or Parkinson disease.  Long-standing diabetes can decrease the sensation of the bladder. This loss of sensation makes it harder to sense the bladder needs to be emptied. Over a period of years, the bladder is stretched out by constant overfilling. This weakens the bladder muscles so that the bladder does not empty well and has less capacity to fill with new urine.  Interstitial cystitis (also called painful bladder syndrome). This condition develops because the tissues that line the inside of the bladder are inflamed (inflammation is the body's way of reacting to injury or infection). It causes pain and frequent urination. It occurs in women more often than in men. DIAGNOSIS   To decide what might be causing your urinary frequency, your health care provider will probably:  Ask about symptoms you have noticed.  Ask about your overall health. This will include questions about any medications you are taking.  Do a physical examination.  Order some tests. These might include:  A blood test to check for diabetes or other health issues that could be contributing to the problem.  Urine testing. This could measure the flow of urine and the pressure on the bladder.  A test of your neurological system (the brain, spinal cord, and nerves). This is the system that senses the need to urinate.  A bladder test to check whether it is emptying completely when you urinate.  Cystoscopy. This test uses a thin tube with a tiny camera on it. It offers a look inside your urethra and bladder to see if there are problems.  Imaging tests. You might be given a contrast dye and then asked to urinate. X-rays are taken to see how your bladder is working. TREATMENT  It is important for you to be evaluated to determine if the amount or frequency that you have is unusual or abnormal. If it is found to be abnormal, the cause should be determined  and this can usually be found out easily. Depending upon the cause, treatment could include medication, stimulation of the nerves, or surgery. There are not too many things that you can do as an individual to change your urinary frequency. It is important that you balance the amount of fluid intake needed to compensate for your activity and the temperature. Medical problems will be diagnosed and taken care of by your physician. There is no particular bladder training such as Kegel exercises that you can do to help urinary frequency. This is an exercise that is usually recommended for people who  have leaking of urine when they laugh, cough, or sneeze. HOME CARE INSTRUCTIONS   Take any medications your health care provider prescribed or suggested. Follow the directions carefully.  Practice any lifestyle changes that are recommended. These might include:  Drinking less fluid or drinking at different times of the day. If you need to urinate often during the night, for example, you may need to stop drinking fluids early in the evening.  Cutting down on caffeine or alcohol. They both can make you need to urinate more often than normal. Caffeine is found in coffee, tea, and sodas.  Losing weight, if that is recommended.  Keep a journal or a log. You might be asked to record how much you drink and when and where you feel the need to urinate. This will also help evaluate how well the treatment provided by your physician is working. SEEK MEDICAL CARE IF:   Your need to urinate often gets worse.  You feel increased pain or irritation when you urinate.  You notice blood in your urine.  You have questions about any medications that your health care provider recommended.  You notice blood, pus, or swelling at the site of any test or treatment procedure.  You develop a fever of more than 100.682F (38.1C). SEEK IMMEDIATE MEDICAL CARE IF:  You develop a fever of more than 102.82F (38.9C).   This  information is not intended to replace advice given to you by your health care provider. Make sure you discuss any questions you have with your health care provider.   Document Released: 04/23/2009 Document Revised: 07/18/2014 Document Reviewed: 04/23/2009 Elsevier Interactive Patient Education [employer]

## 2015-07-16 NOTE — Telephone Encounter (Signed)
Pt is here

## 2015-07-18 LAB — PSA: PSA: 1.21 ng/mL (ref <=4.00)

## 2015-07-22 ENCOUNTER — Ambulatory Visit (INDEPENDENT_AMBULATORY_CARE_PROVIDER_SITE_OTHER): Payer: Federal, State, Local not specified - PPO | Admitting: Family Medicine

## 2015-07-22 VITALS — BP 132/82 | HR 103 | Temp 97.8°F | Resp 20

## 2015-07-22 DIAGNOSIS — J0101 Acute recurrent maxillary sinusitis: Secondary | ICD-10-CM | POA: Diagnosis not present

## 2015-07-22 DIAGNOSIS — M79604 Pain in right leg: Secondary | ICD-10-CM

## 2015-07-22 MED ORDER — FLUTICASONE PROPIONATE 50 MCG/ACT NA SUSP: 2.0000 | Freq: Every day | NASAL | Status: DC

## 2015-07-22 NOTE — Progress Notes (Signed)
° °  Subjective:    Patient ID: Dylan Washington, male    DOB: 04-17-1961, 55 y.o.   MRN: [id number] By signing my name below, I, Dylan Washington, attest that this documentation has been prepared under the direction and in the presence of [name], MD. Electronically Signed: Javier Dockerobert Ryan Washington, ER Scribe. 07/22/2015. 5:26 PM.  Chief Complaint  Patient presents with   Follow-up    needs work note    HPI HPI Comments: Dylan RockersMarty Zamarron is a 55 y.o. male who [facility] reporting to get a doctor's note for work. He initially left work August 11th, and has been out since that time due to continuing back , leg, foot and hip pain and numbness. He tried to go back to work last week and could not because of continuing leg and foot pain.   He is also complaining of sinus congestion.   Review of Systems  Constitutional: Negative for fever and chills.  HENT: Positive for congestion. Negative for sinus pressure.   Musculoskeletal: Negative for back pain and gait problem.       Intermittent foot pain.      Objective:  BP 132/82 mmHg   Pulse 103   Temp(Src) 97.8 F (36.6 C) (Oral)   Resp 20   SpO2 98%  Physical Exam  Constitutional: He is oriented to person, place, and time. He appears well-developed and well-nourished. No distress.  HENT:  Head: Normocephalic and atraumatic.  Eyes: Pupils are equal, round, and reactive to light.  Neck: Neck supple.  Cardiovascular: Normal rate.   Pulmonary/Chest: Effort normal. No respiratory distress.  Musculoskeletal: Normal range of motion.  Full ROM of his torso. No loss of muscle mass. Seated straight leg raise normal bilaterally.  Neurological: He is alert and oriented to person, place, and time. Coordination normal.  Skin: Skin is warm and dry. He is not diaphoretic.  Psychiatric: He has a normal mood and affect. His behavior is normal.  Nursing note and vitals reviewed.     Assessment & Plan:   This chart was scribed in my presence and  reviewed by me personally.    ICD-9-CM ICD-10-CM   1. Acute recurrent maxillary sinusitis 461.0 J01.01 fluticasone (FLONASE) 50 MCG/ACT nasal spray  2. Right leg pain 729.5 M79.604    no written to return to work Signed, [name], MD

## 2015-07-23 ENCOUNTER — Telehealth: Payer: Self-pay

## 2015-09-04 ENCOUNTER — Encounter: Payer: Self-pay | Admitting: *Deleted

## 2015-09-04 NOTE — Progress Notes (Signed)
Faxed signed orders back to [facility] for CPAP supplies. Fax: [phone]. Received confirmation.

## 2015-09-15 ENCOUNTER — Ambulatory Visit (INDEPENDENT_AMBULATORY_CARE_PROVIDER_SITE_OTHER): Payer: Federal, State, Local not specified - PPO | Admitting: Family Medicine

## 2015-09-15 VITALS — BP 142/82 | HR 74 | Temp 98.2°F | Resp 16 | Ht 71.0 in | Wt 253.0 lb

## 2015-09-15 DIAGNOSIS — M7631 Iliotibial band syndrome, right leg: Secondary | ICD-10-CM

## 2015-09-15 DIAGNOSIS — J0101 Acute recurrent maxillary sinusitis: Secondary | ICD-10-CM | POA: Diagnosis not present

## 2015-09-15 MED ORDER — FLUTICASONE PROPIONATE 50 MCG/ACT NA SUSP: 2.0000 | Freq: Every day | NASAL | Status: DC

## 2015-09-15 NOTE — Patient Instructions (Signed)
I refilled the cortisone nasal spray.   We are sending your work note to go back on March 9.

## 2015-09-15 NOTE — Progress Notes (Signed)
Patient ID: Dylan Washington, male   DOB: Mar 18, 1961, 55 y.o.   MRN: [id number]  By signing my name below, I, Dylan Washington, attest that this documentation has been prepared under the direction and in the presence of Dylan Sidle, MD Electronically Signed: Charline Washington, ED Scribe 09/15/2015 at 1:38 PM.  Patient ID: Dylan Washington MRN: [id number], DOB: 03-07-1961, 55 y.o. Date of Encounter: 09/15/2015, 1:21 PM  Primary Physician: Dylan Sidle, MD  Chief Complaint:  Chief Complaint  Patient presents with  . Sinusitis    x 2-3 days  . Cough   HPI: 55 y.o. year old male with history below presents with a note to return to work. Pt states that he has been out of work since 08/29/15 for right lower extremity pain. He states that pain has improved and he is ready to return to work in 2 days, 09/17/15. Pt states that his job requires pushing and pulling but denies heavy lifting.   Sinusitis  Pt also presents with persistent sinus pressure for the past 2-3 days and associated cough. Pt requests a refill of Flonase at this time.   Past Medical History  Diagnosis Date  . Hypertension   . Sleep apnea   . Asthma   . Allergy     allergy shots at [facility]    Home Meds: Prior to Admission medications   Medication Sig Start Date End Date Taking? Authorizing Provider  amLODipine (NORVASC) 5 MG tablet Take 1 tablet (5 mg total) by mouth daily. 10/07/13  Yes Dylan Sidle, MD  cetirizine (ZYRTEC) 10 MG tablet Take 1 tablet (10 mg total) by mouth daily. 03/17/13  Yes Dylan Sidle, MD  fluticasone (FLONASE) 50 MCG/ACT nasal spray Place 2 sprays into both nostrils daily. 07/22/15  Yes Dylan Sidle, MD  losartan-hydrochlorothiazide (HYZAAR) 100-25 MG tablet TAKE 1 TABLET BY MOUTH EVERY DAY 05/26/15  Yes Dylan Jeffery, PA-C  Multiple Vitamins-Minerals (ADEKS) chewable tablet Chew 1 tablet by mouth daily.   Yes Historical Provider, MD  tadalafil (CIALIS) 20 MG tablet Take 0.5-1 tablets (10-20  mg total) by mouth every other day as needed for erectile dysfunction. Patient not taking: Reported on 09/15/2015 06/25/15   Dylan Sidle, MD   Allergies: No Known Allergies  Social History   Social History  . Marital Status: Single    Spouse Name: N/A  . Number of Children: 1  . Years of Education: college   Occupational History  .  [employer]   Social History Main Topics  . Smoking status: Current Every Day Smoker  . Smokeless tobacco: Not on file  . Alcohol Use: No  . Drug Use: No  . Sexual Activity: Yes   Other Topics Concern  . Not on file   Social History Narrative   Marital status: married;       Children: none      Lives: with wife.      Employment: works at [employer]; Solicitor.      Tobacco: none      Alcohol: none      Drugs: none      Exercise: gym 4-5 days per week.   Denies  Caffeine use     Review of Systems: Constitutional: negative for chills, fever, night sweats, weight changes, or fatigue  HEENT: negative for vision changes, hearing loss, congestion, rhinorrhea, ST, epistaxis, +sinus pressure Cardiovascular: negative for chest pain or palpitations Respiratory: negative for hemoptysis, wheezing, shortness of breath, +cough Abdominal: negative for abdominal pain, nausea, vomiting, diarrhea,  or constipation Msk: +myalgias Dermatological: negative for rash Neurologic: negative for headache, dizziness, or syncope All other systems reviewed and are otherwise negative with the exception to those above and in the HPI.  Physical Exam: Blood pressure 142/82, pulse 74, temperature 98.2 F (36.8 C), resp. rate 16, height 5\' 11"  (1.803 m), weight 253 lb (114.76 kg), SpO2 98 %., Body mass index is 35.3 kg/(m^2). General: Well developed, well nourished, in no acute distress. Head: Normocephalic, atraumatic, eyes without discharge, sclera non-icteric. Nasal passages are swollen. Bilateral auditory canals clear, TM's are without perforation, pearly grey and  translucent with reflective cone of light bilaterally. Oral cavity moist, posterior pharynx without exudate, erythema, peritonsillar abscess, or post nasal drip.  Neck: Supple. No thyromegaly. Full ROM. No lymphadenopathy. Lungs: Clear bilaterally to auscultation without wheezes, rales, or rhonchi. Breathing is unlabored. Heart: RRR with S1 S2. No murmurs, rubs, or gallops appreciated. Abdomen: Soft, non-tender, non-distended with normoactive bowel sounds. No hepatomegaly. No rebound/guarding. No obvious abdominal masses. Msk:  Strength and tone normal for age. Extremities/Skin: Warm and dry. No clubbing or cyanosis. No edema. No rashes or suspicious lesions. Neuro: Alert and oriented X 3. Moves all extremities spontaneously. Gait is normal. CNII-XII grossly in tact. Psych:  Responds to questions appropriately with a normal affect.   Labs:  ASSESSMENT AND PLAN:  55 y.o. year old male with  No diagnosis found.  This chart was scribed in my presence and reviewed by me personally.    ICD-9-CM ICD-10-CM   1. Acute recurrent maxillary sinusitis 461.0 J01.01 fluticasone (FLONASE) 50 MCG/ACT nasal spray  2. Iliotibial band syndrome affecting right lower leg 728.89 M76.31      Signed, Dylan SidleKurt Cortland Crehan, MD 09/15/2015 1:21 PM

## 2015-09-17 ENCOUNTER — Telehealth: Payer: Self-pay | Admitting: *Deleted

## 2015-09-17 NOTE — Telephone Encounter (Signed)
[facility]

## 2015-09-17 NOTE — Telephone Encounter (Signed)
Letter faxed to Graciella Beltoneanna Cashion, RN Occupational Health Nurse Administrater in [place].  Fax number [phone]

## 2015-10-27 ENCOUNTER — Ambulatory Visit (INDEPENDENT_AMBULATORY_CARE_PROVIDER_SITE_OTHER): Payer: Federal, State, Local not specified - PPO | Admitting: Family Medicine

## 2015-10-27 VITALS — BP 136/84 | HR 70 | Temp 98.8°F | Resp 18 | Ht 70.0 in | Wt 258.0 lb

## 2015-10-27 DIAGNOSIS — M25571 Pain in right ankle and joints of right foot: Secondary | ICD-10-CM

## 2015-10-27 MED ORDER — MELOXICAM 7.5 MG PO TABS: 7.5000 mg | ORAL_TABLET | Freq: Every day | ORAL | Status: DC

## 2015-10-27 NOTE — Progress Notes (Signed)
55 yo who has had chronic problems for the past year.  He went back to work as suggested.  He is still having right ankle pain at times.  It radiates up the calf.  Ruptured Achilles with ORIF in 1997. The pain is interfering with sleep.  He works in [place] for [employer]  He cannot apply for [employer] because he has not accrued enough hours yet.    Objective:  BP 136/84 mmHg  Pulse 70  Temp(Src) 98.8 F (37.1 C) (Oral)  Resp 18  Ht 5\' 10"  (1.778 m)  Wt 258 lb (117.028 kg)  BMI 37.02 kg/m2  SpO2 98% Right ankle:  Post op Achilles surgical scar (well healed), otherwise no bony abn.  Good pedal pulses.  Assessment:  Intermittent ankle pain in patient who missed 6 months of work.  Random pain in right ankle.  Plan:   meloxicam 7.5 mg prn  Elvina SidleKurt Yvaine Jankowiak, MD

## 2015-10-27 NOTE — Patient Instructions (Addendum)
Let me know when you qualify for FMLA restrictions.

## 2015-11-12 DIAGNOSIS — I10 Essential (primary) hypertension: Secondary | ICD-10-CM | POA: Diagnosis not present

## 2015-11-12 DIAGNOSIS — M129 Arthropathy, unspecified: Secondary | ICD-10-CM | POA: Diagnosis not present

## 2015-11-12 DIAGNOSIS — G8929 Other chronic pain: Secondary | ICD-10-CM | POA: Diagnosis not present

## 2015-11-12 DIAGNOSIS — M25571 Pain in right ankle and joints of right foot: Secondary | ICD-10-CM | POA: Diagnosis not present

## 2015-12-28 DIAGNOSIS — G4709 Other insomnia: Secondary | ICD-10-CM | POA: Diagnosis not present

## 2015-12-28 DIAGNOSIS — F411 Generalized anxiety disorder: Secondary | ICD-10-CM | POA: Diagnosis not present

## 2016-01-22 ENCOUNTER — Other Ambulatory Visit: Payer: Self-pay | Admitting: Physician Assistant

## 2016-01-26 DIAGNOSIS — M792 Neuralgia and neuritis, unspecified: Secondary | ICD-10-CM | POA: Diagnosis not present

## 2016-01-26 DIAGNOSIS — M25571 Pain in right ankle and joints of right foot: Secondary | ICD-10-CM | POA: Diagnosis not present

## 2016-01-27 ENCOUNTER — Other Ambulatory Visit: Payer: Self-pay

## 2016-02-02 DIAGNOSIS — M792 Neuralgia and neuritis, unspecified: Secondary | ICD-10-CM | POA: Diagnosis not present

## 2016-02-02 DIAGNOSIS — M25571 Pain in right ankle and joints of right foot: Secondary | ICD-10-CM | POA: Diagnosis not present

## 2016-02-02 DIAGNOSIS — M79609 Pain in unspecified limb: Secondary | ICD-10-CM | POA: Diagnosis not present

## 2016-02-08 ENCOUNTER — Encounter: Payer: Self-pay | Admitting: Physician Assistant

## 2016-02-08 DIAGNOSIS — M7750 Other enthesopathy of unspecified foot: Secondary | ICD-10-CM | POA: Insufficient documentation

## 2016-02-09 DIAGNOSIS — M792 Neuralgia and neuritis, unspecified: Secondary | ICD-10-CM | POA: Diagnosis not present

## 2016-02-09 DIAGNOSIS — M25571 Pain in right ankle and joints of right foot: Secondary | ICD-10-CM | POA: Diagnosis not present

## 2016-02-16 DIAGNOSIS — M25571 Pain in right ankle and joints of right foot: Secondary | ICD-10-CM | POA: Diagnosis not present

## 2016-03-08 DIAGNOSIS — M25571 Pain in right ankle and joints of right foot: Secondary | ICD-10-CM | POA: Diagnosis not present

## 2016-03-21 DIAGNOSIS — M79671 Pain in right foot: Secondary | ICD-10-CM | POA: Diagnosis not present

## 2016-03-21 DIAGNOSIS — M25571 Pain in right ankle and joints of right foot: Secondary | ICD-10-CM | POA: Diagnosis not present

## 2016-03-23 DIAGNOSIS — Z86010 Personal history of colonic polyps: Secondary | ICD-10-CM | POA: Diagnosis not present

## 2016-03-23 DIAGNOSIS — K6289 Other specified diseases of anus and rectum: Secondary | ICD-10-CM | POA: Diagnosis not present

## 2016-03-23 DIAGNOSIS — K5900 Constipation, unspecified: Secondary | ICD-10-CM | POA: Diagnosis not present

## 2016-03-23 DIAGNOSIS — Z800 Family history of malignant neoplasm of digestive organs: Secondary | ICD-10-CM | POA: Diagnosis not present

## 2016-04-05 DIAGNOSIS — M792 Neuralgia and neuritis, unspecified: Secondary | ICD-10-CM | POA: Diagnosis not present

## 2016-04-08 DIAGNOSIS — M25571 Pain in right ankle and joints of right foot: Secondary | ICD-10-CM | POA: Diagnosis not present

## 2016-04-08 DIAGNOSIS — M5441 Lumbago with sciatica, right side: Secondary | ICD-10-CM | POA: Diagnosis not present

## 2016-04-12 DIAGNOSIS — M25571 Pain in right ankle and joints of right foot: Secondary | ICD-10-CM | POA: Diagnosis not present

## 2016-04-12 DIAGNOSIS — M5441 Lumbago with sciatica, right side: Secondary | ICD-10-CM | POA: Diagnosis not present

## 2016-04-19 ENCOUNTER — Other Ambulatory Visit: Payer: Self-pay | Admitting: Family Medicine

## 2016-04-19 DIAGNOSIS — M5417 Radiculopathy, lumbosacral region: Secondary | ICD-10-CM | POA: Diagnosis not present

## 2016-04-19 DIAGNOSIS — M5412 Radiculopathy, cervical region: Secondary | ICD-10-CM | POA: Diagnosis not present

## 2016-04-19 DIAGNOSIS — R201 Hypoesthesia of skin: Secondary | ICD-10-CM | POA: Diagnosis not present

## 2016-04-19 DIAGNOSIS — M79604 Pain in right leg: Secondary | ICD-10-CM | POA: Diagnosis not present

## 2016-04-19 DIAGNOSIS — G5602 Carpal tunnel syndrome, left upper limb: Secondary | ICD-10-CM | POA: Diagnosis not present

## 2016-04-19 DIAGNOSIS — R202 Paresthesia of skin: Secondary | ICD-10-CM | POA: Diagnosis not present

## 2016-04-21 DIAGNOSIS — M5441 Lumbago with sciatica, right side: Secondary | ICD-10-CM | POA: Diagnosis not present

## 2016-04-21 DIAGNOSIS — M25571 Pain in right ankle and joints of right foot: Secondary | ICD-10-CM | POA: Diagnosis not present

## 2016-04-25 DIAGNOSIS — K649 Unspecified hemorrhoids: Secondary | ICD-10-CM | POA: Diagnosis not present

## 2016-04-25 DIAGNOSIS — D120 Benign neoplasm of cecum: Secondary | ICD-10-CM | POA: Diagnosis not present

## 2016-04-25 DIAGNOSIS — Z86010 Personal history of colonic polyps: Secondary | ICD-10-CM | POA: Diagnosis not present

## 2016-04-25 DIAGNOSIS — D123 Benign neoplasm of transverse colon: Secondary | ICD-10-CM | POA: Diagnosis not present

## 2016-04-25 DIAGNOSIS — K5900 Constipation, unspecified: Secondary | ICD-10-CM | POA: Diagnosis not present

## 2016-04-25 DIAGNOSIS — K5730 Diverticulosis of large intestine without perforation or abscess without bleeding: Secondary | ICD-10-CM | POA: Diagnosis not present

## 2016-04-25 DIAGNOSIS — Z800 Family history of malignant neoplasm of digestive organs: Secondary | ICD-10-CM | POA: Diagnosis not present

## 2016-04-25 DIAGNOSIS — K648 Other hemorrhoids: Secondary | ICD-10-CM | POA: Diagnosis not present

## 2016-04-25 DIAGNOSIS — K6289 Other specified diseases of anus and rectum: Secondary | ICD-10-CM | POA: Diagnosis not present

## 2016-04-25 DIAGNOSIS — K635 Polyp of colon: Secondary | ICD-10-CM | POA: Diagnosis not present

## 2016-04-26 DIAGNOSIS — D123 Benign neoplasm of transverse colon: Secondary | ICD-10-CM | POA: Diagnosis not present

## 2016-04-26 DIAGNOSIS — D127 Benign neoplasm of rectosigmoid junction: Secondary | ICD-10-CM | POA: Diagnosis not present

## 2016-04-26 DIAGNOSIS — Z0000 Encounter for general adult medical examination without abnormal findings: Secondary | ICD-10-CM | POA: Diagnosis not present

## 2016-04-26 DIAGNOSIS — M5441 Lumbago with sciatica, right side: Secondary | ICD-10-CM | POA: Diagnosis not present

## 2016-04-26 DIAGNOSIS — M25571 Pain in right ankle and joints of right foot: Secondary | ICD-10-CM | POA: Diagnosis not present

## 2016-04-26 DIAGNOSIS — D120 Benign neoplasm of cecum: Secondary | ICD-10-CM | POA: Diagnosis not present

## 2016-04-28 DIAGNOSIS — M79604 Pain in right leg: Secondary | ICD-10-CM | POA: Diagnosis not present

## 2016-04-28 DIAGNOSIS — M5417 Radiculopathy, lumbosacral region: Secondary | ICD-10-CM | POA: Diagnosis not present

## 2016-04-28 DIAGNOSIS — R202 Paresthesia of skin: Secondary | ICD-10-CM | POA: Diagnosis not present

## 2016-04-28 DIAGNOSIS — R201 Hypoesthesia of skin: Secondary | ICD-10-CM | POA: Diagnosis not present

## 2016-05-03 ENCOUNTER — Other Ambulatory Visit: Payer: Self-pay | Admitting: Family Medicine

## 2016-05-03 DIAGNOSIS — M792 Neuralgia and neuritis, unspecified: Secondary | ICD-10-CM | POA: Diagnosis not present

## 2016-05-05 ENCOUNTER — Ambulatory Visit (INDEPENDENT_AMBULATORY_CARE_PROVIDER_SITE_OTHER): Payer: Federal, State, Local not specified - PPO | Admitting: Family Medicine

## 2016-05-05 DIAGNOSIS — N522 Drug-induced erectile dysfunction: Secondary | ICD-10-CM | POA: Diagnosis not present

## 2016-05-05 DIAGNOSIS — J301 Allergic rhinitis due to pollen: Secondary | ICD-10-CM

## 2016-05-05 DIAGNOSIS — J0101 Acute recurrent maxillary sinusitis: Secondary | ICD-10-CM | POA: Diagnosis not present

## 2016-05-05 DIAGNOSIS — I10 Essential (primary) hypertension: Secondary | ICD-10-CM

## 2016-05-05 MED ORDER — FLUTICASONE PROPIONATE 50 MCG/ACT NA SUSP: 2.0000 | Freq: Every day | NASAL | 11 refills | Status: DC

## 2016-05-05 MED ORDER — LOSARTAN POTASSIUM-HCTZ 100-25 MG PO TABS: 1.0000 | ORAL_TABLET | Freq: Every day | ORAL | 3 refills | Status: DC

## 2016-05-05 MED ORDER — CETIRIZINE HCL 10 MG PO TABS: 10.0000 mg | ORAL_TABLET | Freq: Every day | ORAL | 11 refills | Status: DC

## 2016-05-05 MED ORDER — TADALAFIL 20 MG PO TABS: 10.0000 mg | ORAL_TABLET | ORAL | 11 refills | Status: DC | PRN

## 2016-05-05 MED ORDER — AMLODIPINE BESYLATE 5 MG PO TABS: 5.0000 mg | ORAL_TABLET | Freq: Every day | ORAL | 3 refills | Status: DC

## 2016-05-05 NOTE — Patient Instructions (Signed)
     IF you received an x-ray today, you will receive an invoice from [facility]. Please contact [facility] at [phone] with questions or concerns regarding your invoice.   IF you received labwork today, you will receive an invoice from [facility]. Please contact [facility] at [phone] with questions or concerns regarding your invoice.   Our billing staff will not be able to assist you with questions regarding bills from these companies.  You will be contacted with the lab results as soon as they are available. The fastest way to get your results is to activate your My Chart account. Instructions are located on the last page of this paperwork. If you have not heard from us regarding the results in 2 weeks, please contact this office.      

## 2016-05-05 NOTE — Progress Notes (Signed)
This a 55 year old gentleman who works for the [employer]. [place] has [place] the [place] 2 [place]. He has not been taking his blood pressure medicine recently. Needs a refill. He has a mild headache much of the time. He denies chest pain or shortness of breath, swelling in the ankles, or abdominal pain.  He reports his back pain is resolved.  Objective:  BP (!) 166/110 (BP Location: Right Arm, Patient Position: Sitting, Cuff Size: Large)   Pulse 92   Temp 98.2 F (36.8 C) (Oral)   Resp 17   Ht 5\' 10"  (1.778 m)   Wt 262 lb (118.8 kg)   SpO2 98%   BMI 37.59 kg/m  HEENT: Unremarkable Chest: Clear Heart: Regular with no murmur or gallop Extremity: No edema or rub   assessment: Uncontrolled hypertension. [place] run out of his medication and needs to get back on it of course. Also, patient has been very stressed with driving to [place] every day making for a 14 hour day with the driving. He hopes that once he settled in his condo down in [place], stressful let up.  Plan: Follow-up in [place] with his new primary care doctor I refilled his other medications.  Signed, Sheila OatsKurt Tomika Eckles M.D.

## 2016-05-30 ENCOUNTER — Ambulatory Visit (INDEPENDENT_AMBULATORY_CARE_PROVIDER_SITE_OTHER): Payer: Federal, State, Local not specified - PPO | Admitting: Physician Assistant

## 2016-05-30 VITALS — BP 160/94 | HR 80 | Temp 98.5°F | Resp 18 | Ht 70.0 in | Wt 252.2 lb

## 2016-05-30 DIAGNOSIS — J069 Acute upper respiratory infection, unspecified: Secondary | ICD-10-CM | POA: Diagnosis not present

## 2016-05-30 DIAGNOSIS — R030 Elevated blood-pressure reading, without diagnosis of hypertension: Secondary | ICD-10-CM

## 2016-05-30 MED ORDER — HYDROCODONE-HOMATROPINE 5-1.5 MG/5ML PO SYRP: 2.5000 mL | ORAL_SOLUTION | Freq: Every day | ORAL | 0 refills | Status: AC

## 2016-05-30 MED ORDER — AMOXICILLIN 875 MG PO TABS: 875.0000 mg | ORAL_TABLET | Freq: Two times a day (BID) | ORAL | 0 refills | Status: DC

## 2016-05-30 NOTE — Progress Notes (Signed)
05/30/2016 4:48 PM   DOB: 09/22/1960 / MRN: [id number]  SUBJECTIVE:  Dylan Washington is a 55 y.o. male presenting for 7 days of early morning cough.  He denies fever, sore throat, chills, nausea, emesis. Feels that he needs an antibiotic.  He associates nasal congestion.  He is a current smoker and smokes 3 cigarettes a day and says "I've been doing this a long time."   He is currently taking his BP medication. He does report missing some doses of medication.  Complains of persistent generalized HA "but not really."  Denies orthopnea, peripheral edema, SOB, DOE.    Has a "cyst" on his head and would like this looked at today.    He has No Known Allergies.   He  has a past medical history of Allergy; Asthma; Hypertension; and Sleep apnea.    He  reports that he has been smoking.  He does not have any smokeless tobacco history on file. He reports that he does not drink alcohol or use drugs. He  reports that he currently engages in sexual activity. The patient  has a past surgical history that includes Achilles tendon repair.  His family history includes Cancer in his mother; Hypertension in his father and mother.  Review of Systems  Constitutional: Positive for malaise/fatigue. Negative for chills, diaphoresis and fever.  HENT: Positive for congestion. Negative for sore throat.   Respiratory: Positive for cough and sputum production. Negative for hemoptysis, shortness of breath and wheezing.   Cardiovascular: Negative for chest pain.  Gastrointestinal: Negative for nausea.  Musculoskeletal: Negative for myalgias.  Skin: Negative for rash.  Neurological: Negative for dizziness and weakness.  Endo/Heme/Allergies: Negative for polydipsia.  Psychiatric/Behavioral: Negative.     The problem list and medications were reviewed and updated by myself where necessary and exist elsewhere in the encounter.   OBJECTIVE:  BP (!) 160/94 (BP Location: Right Arm, Patient Position: Sitting, Cuff  Size: Large)   Pulse 80   Temp 98.5 F (36.9 C) (Oral)   Resp 18   Ht 5\' 10"  (1.778 m)   Wt 252 lb 3.2 oz (114.4 kg)   SpO2 100%   BMI 36.19 kg/m   Physical Exam  Constitutional: He is oriented to person, place, and time. He appears well-developed. He does not appear ill.  HENT:  Head:    Right Ear: External ear normal.  Left Ear: External ear normal.  Nose: Nose normal.  Mouth/Throat: No oropharyngeal exudate.  Eyes: Conjunctivae and EOM are normal. Pupils are equal, round, and reactive to light.  Cardiovascular: Normal rate.   Pulmonary/Chest: Effort normal.  Abdominal: He exhibits no distension.  Musculoskeletal: Normal range of motion.  Neurological: He is alert and oriented to person, place, and time. No cranial nerve deficit. Coordination normal.  Skin: Skin is warm and dry. He is not diaphoretic.  Psychiatric: He has a normal mood and affect.  Nursing note and vitals reviewed.   No results found for this or any previous visit (from the past 72 hour(s)).  No results found.  Wt Readings from Last 3 Encounters:  05/30/16 252 lb 3.2 oz (114.4 kg)  05/05/16 262 lb (118.8 kg)  10/27/15 258 lb (117 kg)     ASSESSMENT AND PLAN  Dylan Washington was seen today for nasal congestion, cough, wheezing and cyst.  Diagnoses and all orders for this visit:  Upper respiratory tract infection, unspecified type: I have advised that he wait at least until ten days before starting amox.  Hycodan  and zyrtec for now.    Other orders -     amoxicillin (AMOXIL) 875 MG tablet; Take 1 tablet (875 mg total) by mouth 2 (two) times daily. Please wait until day ten of total illness before filling. -     HYDROcodone-homatropine (HYCODAN) 5-1.5 MG/5ML syrup; Take 2.5-5 mLs by mouth at bedtime.  Elevated blood pressure: Advised that he take his BP medication consistently.   The patient is advised to call or return to clinic if he does not see an improvement in symptoms, or to seek the care of the  closest emergency department if he worsens with the above plan.   Deliah BostonMichael Shalawn Wynder, MHS, PA-C [facility] 05/30/2016 4:48 PM

## 2016-05-30 NOTE — Patient Instructions (Addendum)
Take zyrtec daily in the morning.      IF you received an x-ray today, you will receive an invoice from [facility]. Please contact [facility] at [phone] with questions or concerns regarding your invoice.   IF you received labwork today, you will receive an invoice from [facility]. Please contact [facility] at [phone] with questions or concerns regarding your invoice.   Our billing staff will not be able to assist you with questions regarding bills from these companies.  You will be contacted with the lab results as soon as they are available. The fastest way to get your results is to activate your My Chart account. Instructions are located on the last page of this paperwork. If you have not heard from us regarding the results in 2 weeks, please contact this office.

## 2016-06-07 DIAGNOSIS — R202 Paresthesia of skin: Secondary | ICD-10-CM | POA: Diagnosis not present

## 2016-06-07 DIAGNOSIS — G5602 Carpal tunnel syndrome, left upper limb: Secondary | ICD-10-CM | POA: Diagnosis not present

## 2016-06-07 DIAGNOSIS — M79604 Pain in right leg: Secondary | ICD-10-CM | POA: Diagnosis not present

## 2016-06-07 DIAGNOSIS — M5417 Radiculopathy, lumbosacral region: Secondary | ICD-10-CM | POA: Diagnosis not present

## 2016-06-07 DIAGNOSIS — M5412 Radiculopathy, cervical region: Secondary | ICD-10-CM | POA: Diagnosis not present

## 2016-06-16 ENCOUNTER — Ambulatory Visit (INDEPENDENT_AMBULATORY_CARE_PROVIDER_SITE_OTHER): Payer: Federal, State, Local not specified - PPO

## 2016-06-16 ENCOUNTER — Ambulatory Visit (INDEPENDENT_AMBULATORY_CARE_PROVIDER_SITE_OTHER): Payer: Federal, State, Local not specified - PPO | Admitting: Physician Assistant

## 2016-06-16 VITALS — BP 168/96 | HR 86 | Temp 98.3°F | Resp 17 | Ht 70.0 in | Wt 264.0 lb

## 2016-06-16 DIAGNOSIS — R05 Cough: Secondary | ICD-10-CM

## 2016-06-16 DIAGNOSIS — R0981 Nasal congestion: Secondary | ICD-10-CM

## 2016-06-16 DIAGNOSIS — J9811 Atelectasis: Secondary | ICD-10-CM

## 2016-06-16 DIAGNOSIS — R059 Cough, unspecified: Secondary | ICD-10-CM

## 2016-06-16 DIAGNOSIS — J321 Chronic frontal sinusitis: Secondary | ICD-10-CM

## 2016-06-16 LAB — POCT CBC
Granulocyte percent: 71.1 % (ref 37–80)
HCT, POC: 40.7 % — AB (ref 43.5–53.7)
Hemoglobin: 14.5 g/dL (ref 14.1–18.1)
Lymph, poc: 3.5 — AB (ref 0.6–3.4)
MCH, POC: 35.9 pg — AB (ref 27–31.2)
MCHC: 35.5 g/dL — AB (ref 31.8–35.4)
MCV: 101.1 fL — AB (ref 80–97)
MID (cbc): 0.7 (ref 0–0.9)
MPV: 7.5 fL (ref 0–99.8)
POC Granulocyte: 10.2 — AB (ref 2–6.9)
POC LYMPH PERCENT: 24.3 %L (ref 10–50)
POC MID %: 4.6 %M (ref 0–12)
Platelet Count, POC: 328 10*3/uL (ref 142–424)
RBC: 4.02 M/uL — AB (ref 4.69–6.13)
RDW, POC: 13.7 %
WBC: 14.4 10*3/uL — AB (ref 4.6–10.2)

## 2016-06-16 MED ORDER — HYDROCODONE-HOMATROPINE 5-1.5 MG/5ML PO SYRP: 5.0000 mL | ORAL_SOLUTION | Freq: Three times a day (TID) | ORAL | 0 refills | Status: DC | PRN

## 2016-06-16 MED ORDER — AMOXICILLIN-POT CLAVULANATE 875-125 MG PO TABS: 1.0000 | ORAL_TABLET | Freq: Two times a day (BID) | ORAL | 0 refills | Status: AC

## 2016-06-16 NOTE — Progress Notes (Signed)
Dylan Washington  MRN: [id number] DOB: 10-Nov-1960  PCP: Dylan SidleKurt Lauenstein, MD  Subjective:  Pt is a 55 year old male, PMH, HTN, RBBB, OSA, asthma, allergies, who presents to clinic for f/u URI.  He has been sick for almost four weeks now.   He was here on 05/30/2016 for early morning cough and nasal congestion. Prescribed amoxicillin and Hycodan. He took two tablets of the amoxicillin, then lost the bottle.   Today he c/o productive cough which keeps him up at night, worse in the early morning. Notes chills a few days ago, congestion, sinus pressure and headache.    Has taken thermaflu and hycodan. Hycodan helped a lot with his sleeping. He has been out of hycodan for 10 days and would like a refill.  Denies fever, chest pain ,SOB, wheezing, chest tightness, nausea, vomiting, abdominal pain.   Smoker - 3 cigarettes a day.   Review of Systems  Constitutional: Positive for chills. Negative for diaphoresis and fever.  HENT: Positive for congestion, postnasal drip, rhinorrhea, sinus pain and sinus pressure. Negative for sneezing and sore throat.   Respiratory: Positive for cough. Negative for chest tightness, shortness of breath and wheezing.   Cardiovascular: Negative for chest pain and palpitations.  Gastrointestinal: Negative for diarrhea, nausea and vomiting.  Musculoskeletal: Negative for neck pain.  Neurological: Negative for dizziness, syncope, light-headedness and headaches.  Psychiatric/Behavioral: Positive for sleep disturbance.    Patient Active Problem List   Diagnosis Date Noted  . Bone spur of foot 02/08/2016  . RBBB (right bundle branch block) 04/02/2014  . Asthma 01/17/2012  . Environmental allergies 08/16/2011  . Hypertension   . Sleep apnea     Current Outpatient Prescriptions on File Prior to Visit  Medication Sig Dispense Refill  . amLODipine (NORVASC) 5 MG tablet Take 1 tablet (5 mg total) by mouth daily. 90 tablet 3  . cetirizine (ZYRTEC) 10 MG tablet Take 1  tablet (10 mg total) by mouth daily. 30 tablet 11  . fluticasone (FLONASE) 50 MCG/ACT nasal spray Place 2 sprays into both nostrils daily. 16 g 11  . losartan-hydrochlorothiazide (HYZAAR) 100-25 MG tablet Take 1 tablet by mouth daily. 90 tablet 3  . Multiple Vitamins-Minerals (ADEKS) chewable tablet Chew 1 tablet by mouth daily.    . tadalafil (CIALIS) 20 MG tablet Take 0.5-1 tablets (10-20 mg total) by mouth every other day as needed for erectile dysfunction. 5 tablet 11   No current facility-administered medications on file prior to visit.     No Known Allergies   Objective:  BP (!) 168/96 (BP Location: Right Arm, Patient Position: Sitting, Cuff Size: Large)   Pulse 86   Temp 98.3 F (36.8 C) (Oral)   Resp 17   Ht 5\' 10"  (1.778 m)   Wt 264 lb (119.7 kg)   SpO2 99%   BMI 37.88 kg/m   Physical Exam  Constitutional: He is oriented to person, place, and time and well-developed, well-nourished, and in no distress. No distress.  HENT:  Right Ear: Tympanic membrane is not bulging.  Left Ear: Tympanic membrane is bulging.  Nose: Right sinus exhibits maxillary sinus tenderness. Right sinus exhibits no frontal sinus tenderness. Left sinus exhibits no frontal sinus tenderness.  Mouth/Throat: Posterior oropharyngeal edema present. No oropharyngeal exudate or posterior oropharyngeal erythema.  Cardiovascular: Normal rate, regular rhythm, S1 normal, S2 normal and normal heart sounds.   Pulmonary/Chest: Effort normal. He has no wheezes. He has no rhonchi. He has no rales.  Lymphadenopathy:  He has no cervical adenopathy.  Neurological: He is alert and oriented to person, place, and time. GCS score is 15.  Skin: Skin is warm and dry.  Psychiatric: Mood, memory, affect and judgment normal.  Vitals reviewed.   Results for orders placed or performed in visit on 06/16/16  POCT CBC  Result Value Ref Range   WBC 14.4 (A) 4.6 - 10.2 K/uL   Lymph, poc 3.5 (A) 0.6 - 3.4   POC LYMPH PERCENT  24.3 10 - 50 %L   MID (cbc) 0.7 0 - 0.9   POC MID % 4.6 0 - 12 %M   POC Granulocyte 10.2 (A) 2 - 6.9   Granulocyte percent 71.1 37 - 80 %G   RBC 4.02 (A) 4.69 - 6.13 M/uL   Hemoglobin 14.5 14.1 - 18.1 g/dL   HCT, POC 16.140.7 (A) 09.643.5 - 53.7 %   MCV 101.1 (A) 80 - 97 fL   MCH, POC 35.9 (A) 27 - 31.2 pg   MCHC 35.5 (A) 31.8 - 35.4 g/dL   RDW, POC 04.513.7 %   Platelet Count, POC 328 142 - 424 K/uL   MPV 7.5 0 - 99.8 fL   Dg Chest 2 View  Result Date: 06/16/2016 CLINICAL DATA:  Cough. EXAM: CHEST  2 VIEW COMPARISON:  04/02/2014 . FINDINGS: Mediastinum and hilar structures are normal. Low lung volumes with mild basilar atelectasis again noted. No pleural effusion or pneumothorax. Degenerative change thoracic spine. IMPRESSION: Low lung volumes with mild basilar atelectasis . Electronically Signed   By: Maisie Fushomas  Register   On: 06/16/2016 13:36   Assessment and Plan :  1. Frontal sinusitis, unspecified chronicity 2. Cough 3. Nasal congestion 4. Atelectasis - amoxicillin-clavulanate (AUGMENTIN) 875-125 MG tablet; Take 1 tablet by mouth 2 (two) times daily.  Dispense: 14 tablet; Refill: 0 - POCT CBC - DG Chest 2 View; Future - HYDROcodone-homatropine (HYCODAN) 5-1.5 MG/5ML syrup; Take 5 mLs by mouth every 8 (eight) hours as needed for cough.  Dispense: 120 mL; Refill: 0 - Discussed with patient importance of taking entire course of antibiotics. Supportive care encouraged: fluids, rest. Deep breathing exercises discussed as his xray shows mild atelectasis.  RTC in 5-7 days if no improvement.   Dylan CollieWhitney Naman Spychalski, PA-C  [facility] 06/16/2016 12:14 PM

## 2016-06-16 NOTE — Patient Instructions (Addendum)
Please stay well hydrated while you are fighting this infection and get plenty of rest.  Please finish the ENTIRE COURSE of antibiotics and take them as prescribed. Your infection may not get better if you stop taking this medication before you are finished.   Thank you for coming in today. I hope you feel we met your needs.  Feel free to call [facility] if you have any questions or further requests.  Please consider signing up for MyChart if you do not already have it, as this is a great way to communicate with me.  Best,  Whitney McVey, PA-C   IF you received an x-ray today, you will receive an invoice from [facility]. Please contact [facility] at [phone] with questions or concerns regarding your invoice.   IF you received labwork today, you will receive an invoice from [employer]. Please contact [facility] at [phone] with questions or concerns regarding your invoice.   Our billing staff will not be able to assist you with questions regarding bills from these companies.  You will be contacted with the lab results as soon as they are available. The fastest way to get your results is to activate your My Chart account. Instructions are located on the last page of this paperwork. If you have not heard from [place] regarding the results in 2 weeks, please contact this office.

## 2016-07-20 ENCOUNTER — Ambulatory Visit (INDEPENDENT_AMBULATORY_CARE_PROVIDER_SITE_OTHER): Payer: Federal, State, Local not specified - PPO | Admitting: Emergency Medicine

## 2016-07-20 ENCOUNTER — Encounter: Payer: Self-pay | Admitting: Emergency Medicine

## 2016-07-20 VITALS — BP 154/109 | HR 82 | Temp 98.4°F | Resp 18 | Ht 70.0 in | Wt 264.6 lb

## 2016-07-20 DIAGNOSIS — R030 Elevated blood-pressure reading, without diagnosis of hypertension: Secondary | ICD-10-CM

## 2016-07-20 DIAGNOSIS — J069 Acute upper respiratory infection, unspecified: Secondary | ICD-10-CM | POA: Diagnosis not present

## 2016-07-20 DIAGNOSIS — I10 Essential (primary) hypertension: Secondary | ICD-10-CM | POA: Diagnosis not present

## 2016-07-20 MED ORDER — LOSARTAN POTASSIUM-HCTZ 100-25 MG PO TABS: 1.0000 | ORAL_TABLET | Freq: Every day | ORAL | 3 refills | Status: DC

## 2016-07-20 MED ORDER — AMLODIPINE BESYLATE 5 MG PO TABS: 5.0000 mg | ORAL_TABLET | Freq: Every day | ORAL | 3 refills | Status: DC

## 2016-07-20 MED ORDER — CLONIDINE HCL 0.1 MG PO TABS
0.1000 mg | ORAL_TABLET | Freq: Once | ORAL | Status: AC
  Administered 2016-07-20: 0.1 mg via ORAL

## 2016-07-20 MED ORDER — AZITHROMYCIN 250 MG PO TABS: ORAL_TABLET | ORAL | 0 refills | Status: DC

## 2016-07-20 NOTE — Patient Instructions (Addendum)
IF you received an x-ray today, you will receive an invoice from [facility]. Please contact [facility] at [phone] with questions or concerns regarding your invoice.   IF you received labwork today, you will receive an invoice from [facility]. Please contact [facility] at [phone] with questions or concerns regarding your invoice.   Our billing staff will not be able to assist you with questions regarding bills from these companies.  You will be contacted with the lab results as soon as they are available. The fastest way to get your results is to activate your My Chart account. Instructions are located on the last page of this paperwork. If you have not heard from us regarding the results in 2 weeks, please contact this office.     Upper Respiratory Infection, Adult Most upper respiratory infections (URIs) are caused by a virus. A URI affects the nose, throat, and upper air passages. The most common type of URI is often called "the common cold." Follow these instructions at home:  Take medicines only as told by your doctor.  Gargle warm saltwater or take cough drops to comfort your throat as told by your doctor.  Use a warm mist humidifier or inhale steam from a shower to increase air moisture. This may make it easier to breathe.  Drink enough fluid to keep your pee (urine) clear or pale yellow.  Eat soups and other clear broths.  Have a healthy diet.  Rest as needed.  Go back to work when your fever is gone or your doctor says it is okay.  You may need to stay home longer to avoid giving your URI to others.  You can also wear a face mask and wash your hands often to prevent spread of the virus.  Use your inhaler more if you have asthma.  Do not use any tobacco products, including cigarettes, chewing tobacco, or electronic cigarettes. If you need help quitting, ask your doctor. Contact a doctor if:  You are getting worse, not better.  Your  symptoms are not helped by medicine.  You have chills.  You are getting more short of breath.  You have brown or red mucus.  You have yellow or brown discharge from your nose.  You have pain in your face, especially when you bend forward.  You have a fever.  You have puffy (swollen) neck glands.  You have pain while swallowing.  You have white areas in the back of your throat. Get help right away if:  You have very bad or constant:  Headache.  Ear pain.  Pain in your forehead, behind your eyes, and over your cheekbones (sinus pain).  Chest pain.  You have long-lasting (chronic) lung disease and any of the following:  Wheezing.  Long-lasting cough.  Coughing up blood.  A change in your usual mucus.  You have a stiff neck.  You have changes in your:  Vision.  Hearing.  Thinking.  Mood. This information is not intended to replace advice given to you by your health care provider. Make sure you discuss any questions you have with your health care provider. Document Released: 12/14/2007 Document Revised: 02/28/2016 Document Reviewed: 10/02/2013 Elsevier Interactive Patient Education  2017 [employer]  Hypertension Hypertension is another name for high blood pressure. High blood pressure forces your heart to work harder to pump blood. A blood pressure reading has two numbers, which includes a higher number over a lower number (example: 110/72). Follow these instructions at home:  Have your blood pressure rechecked by your doctor.  Only take medicine as told by your doctor. Follow the directions carefully. The medicine does not work as well if you skip doses. Skipping doses also puts you at risk for problems.  Do not smoke.  Monitor your blood pressure at home as told by your doctor. Contact a doctor if:  You think you are having a reaction to the medicine you are taking.  You have repeat headaches or feel dizzy.  You have puffiness (swelling) in  your ankles.  You have trouble with your vision. Get help right away if:  You get a very bad headache and are confused.  You feel weak, numb, or faint.  You get chest or belly (abdominal) pain.  You throw up (vomit).  You cannot breathe very well. This information is not intended to replace advice given to you by your health care provider. Make sure you discuss any questions you have with your health care provider. Document Released: 12/14/2007 Document Revised: 12/03/2015 Document Reviewed: 04/19/2013 Elsevier Interactive Patient Education  2017 [employer].

## 2016-07-20 NOTE — Progress Notes (Signed)
Dylan Washington 56 y.o.   Chief Complaint  Patient presents with  . Nasal Congestion  . CHEST CONGESTION  . Medication Refill    amlodipine, pt states he has been out a few days  . Flu Vaccine    HISTORY OF PRESENT ILLNESS: This is a 56 y.o. male complaining of nasal congestion and cough x several days but has been struggling with it since last November. Also has h/o HTN and out of Amlodipine; has not taken Norvasc for the past 3-4 days. Denies chest pain/pressure, headache, SOB, n/v, syncope or any other significant symptoms.  HPI   Prior to Admission medications   Medication Sig Start Date End Date Taking? Authorizing Provider  amLODipine (NORVASC) 5 MG tablet Take 1 tablet (5 mg total) by mouth daily. 07/20/16  Yes Anevay Campanella Victorino December, MD  fluticasone [facility]) 50 MCG/ACT nasal spray Place 2 sprays into both nostrils daily. 05/05/16  Yes Elvina Sidle, MD  losartan-hydrochlorothiazide (HYZAAR) 100-25 MG tablet Take 1 tablet by mouth daily. 07/20/16  Yes Heath Tesler Victorino December, MD  Multiple Vitamins-Minerals (ADEKS) chewable tablet Chew 1 tablet by mouth daily.   Yes Historical Provider, MD  cetirizine (ZYRTEC) 10 MG tablet Take 1 tablet (10 mg total) by mouth daily. Patient not taking: Reported on 07/20/2016 05/05/16   Elvina Sidle, MD  HYDROcodone-homatropine [facility]) 5-1.5 MG/5ML syrup Take 5 mLs by mouth every 8 (eight) hours as needed for cough. Patient not taking: Reported on 07/20/2016 06/16/16   Madelaine Bhat McVey, PA-C  tadalafil (CIALIS) 20 MG tablet Take 0.5-1 tablets (10-20 mg total) by mouth every other day as needed for erectile dysfunction. Patient not taking: Reported on 07/20/2016 05/05/16   Elvina Sidle, MD    No Known Allergies  Patient Active Problem List   Diagnosis Date Noted  . Bone spur of foot 02/08/2016  . RBBB (right bundle branch block) 04/02/2014  . Asthma 01/17/2012  . Environmental allergies 08/16/2011  . Hypertension   . Sleep apnea       Past Medical History:  Diagnosis Date  . Allergy    allergy shots at [facility]  . Asthma   . Hypertension   . Sleep apnea     Past Surgical History:  Procedure Laterality Date  . ACHILLES TENDON REPAIR      Social History   Social History  . Marital status: Single    Spouse name: N/A  . Number of children: 1  . Years of education: college   Occupational History  .  [employer]   Social History Main Topics  . Smoking status: Current Every Day Smoker  . Smokeless tobacco: Not on file  . Alcohol use No  . Drug use: No  . Sexual activity: Yes   Other Topics Concern  . Not on file   Social History Narrative   Marital status: married;       Children: none      Lives: with wife.      Employment: works at [employer]; Solicitor.      Tobacco: none      Alcohol: none      Drugs: none      Exercise: gym 4-5 days per week.   Denies  Caffeine use     Family History  Problem Relation Age of Onset  . Hypertension Mother   . Cancer Mother   . Hypertension Father      Review of Systems  Constitutional: Positive for malaise/fatigue. Negative for chills and fever.  HENT: Positive for congestion. Negative for nosebleeds, sinus pain and sore throat.   Eyes: Negative for blurred vision, discharge and redness.  Respiratory: Positive for cough. Negative for hemoptysis, shortness of breath and wheezing.   Cardiovascular: Negative for chest pain, palpitations and leg swelling.  Gastrointestinal: Negative for abdominal pain, diarrhea, nausea and vomiting.  Genitourinary: Negative for dysuria, flank pain and hematuria.  Musculoskeletal: Negative for back pain, myalgias and neck pain.  Skin: Negative for rash.  Neurological: Negative for dizziness, tingling, sensory change, speech change, focal weakness, loss of consciousness and headaches.  Endo/Heme/Allergies: Negative.   Psychiatric/Behavioral: Negative.   All other systems reviewed and are  negative.  Vitals:   07/20/16 1610  BP: (!) 192/108  Pulse: 82  Resp: 18  Temp: 98.4 F (36.9 C)     Physical Exam  Constitutional: He is oriented to person, place, and time. He appears well-developed and well-nourished.  HENT:  Head: Normocephalic and atraumatic.  Mouth/Throat: Oropharynx is clear and moist.  Eyes: Conjunctivae and EOM are normal. Pupils are equal, round, and reactive to light.  Neck: Normal range of motion. Neck supple.  Cardiovascular: Normal rate, regular rhythm, normal heart sounds and intact distal pulses.   Pulmonary/Chest: Effort normal and breath sounds normal.  Abdominal: Soft. Bowel sounds are normal. He exhibits no distension. There is no tenderness.  Musculoskeletal: Normal range of motion.  Neurological: He is alert and oriented to person, place, and time. He displays normal reflexes. No sensory deficit. He exhibits normal muscle tone. Coordination normal.  Skin: Skin is warm and dry.  Psychiatric: He has a normal mood and affect. His behavior is normal.  Nursing note and vitals reviewed.    ASSESSMENT & PLAN: Gorge was seen today for nasal congestion, chest congestion, medication refill and flu vaccine.  Diagnoses and all orders for this visit:  Elevated blood pressure reading -     cloNIDine (CATAPRES) tablet 0.1 mg; Take 1 tablet (0.1 mg total) by mouth once.  Essential hypertension -     amLODipine (NORVASC) 5 MG tablet; Take 1 tablet (5 mg total) by mouth daily.  Upper respiratory tract infection, unspecified type  Other orders -     Cancel: Flu Vaccine QUAD 36+ mos IM -     losartan-hydrochlorothiazide (HYZAAR) 100-25 MG tablet; Take 1 tablet by mouth daily. -     azithromycin (ZITHROMAX) 250 MG tablet; Sig as indicated    Patient Instructions       IF you received an x-ray today, you will receive an invoice from [facility]. Please contact [facility] at [phone] with questions or concerns regarding  your invoice.   IF you received labwork today, you will receive an invoice from [place]. Please contact [facility] at [phone] with questions or concerns regarding your invoice.   Our billing staff will not be able to assist you with questions regarding bills from these companies.  You will be contacted with the lab results as soon as they are available. The fastest way to get your results is to activate your My Chart account. Instructions are located on the last page of this paperwork. If you have not heard from [place] regarding the results in 2 weeks, please contact this office.     Upper Respiratory Infection, Adult Most upper respiratory infections (URIs) are caused by a virus. A URI affects the nose, throat, and upper air passages. The most common type of URI is often called "the common cold." Follow these instructions at home:  Take medicines only as told by your doctor.  Gargle warm saltwater or take cough drops to comfort your throat as told by your doctor.  Use a warm mist humidifier or inhale steam from a shower to increase air moisture. This may make it easier to breathe.  Drink enough fluid to keep your pee (urine) clear or pale yellow.  Eat soups and other clear broths.  Have a healthy diet.  Rest as needed.  Go back to work when your fever is gone or your doctor says it is okay.  You may need to stay home longer to avoid giving your URI to others.  You can also wear a face mask and wash your hands often to prevent spread of the virus.  Use your inhaler more if you have asthma.  Do not use any tobacco products, including cigarettes, chewing tobacco, or electronic cigarettes. If you need help quitting, ask your doctor. Contact a doctor if:  You are getting worse, not better.  Your symptoms are not helped by medicine.  You have chills.  You are getting more short of breath.  You have brown or red mucus.  You have yellow or brown discharge from your  nose.  You have pain in your face, especially when you bend forward.  You have a fever.  You have puffy (swollen) neck glands.  You have pain while swallowing.  You have white areas in the back of your throat. Get help right away if:  You have very bad or constant:  Headache.  Ear pain.  Pain in your forehead, behind your eyes, and over your cheekbones (sinus pain).  Chest pain.  You have long-lasting (chronic) lung disease and any of the following:  Wheezing.  Long-lasting cough.  Coughing up blood.  A change in your usual mucus.  You have a stiff neck.  You have changes in your:  Vision.  Hearing.  Thinking.  Mood. This information is not intended to replace advice given to you by your health care provider. Make sure you discuss any questions you have with your health care provider. Document Released: 12/14/2007 Document Revised: 02/28/2016 Document Reviewed: 10/02/2013 Elsevier Interactive Patient Education  2017 Elsevier Inc.  Hypertension Hypertension is another name for high blood pressure. High blood pressure forces your heart to work harder to pump blood. A blood pressure reading has two numbers, which includes a higher number over a lower number (example: 110/72). Follow these instructions at home:  Have your blood pressure rechecked by your doctor.  Only take medicine as told by your doctor. Follow the directions carefully. The medicine does not work as well if you skip doses. Skipping doses also puts you at risk for problems.  Do not smoke.  Monitor your blood pressure at home as told by your doctor. Contact a doctor if:  You think you are having a reaction to the medicine you are taking.  You have repeat headaches or feel dizzy.  You have puffiness (swelling) in your ankles.  You have trouble with your vision. Get help right away if:  You get a very bad headache and are confused.  You feel weak, numb, or faint.  You get chest or  belly (abdominal) pain.  You throw up (vomit).  You cannot breathe very well. This information is not intended to replace advice given to you by your health care provider. Make sure you discuss any questions you have with your health care provider. Document Released: 12/14/2007 Document Revised: 12/03/2015 Document Reviewed:  04/19/2013 Elsevier Interactive Patient Education  2017 Elsevier Inc.      Edwina BarthMiguel Lanae Federer, MD [facility]

## 2016-07-23 ENCOUNTER — Ambulatory Visit (INDEPENDENT_AMBULATORY_CARE_PROVIDER_SITE_OTHER): Payer: Federal, State, Local not specified - PPO | Admitting: Family Medicine

## 2016-07-23 VITALS — BP 134/84 | HR 84 | Temp 98.0°F | Resp 18 | Ht 70.0 in | Wt 261.0 lb

## 2016-07-23 DIAGNOSIS — Z23 Encounter for immunization: Secondary | ICD-10-CM

## 2016-07-23 DIAGNOSIS — J069 Acute upper respiratory infection, unspecified: Secondary | ICD-10-CM

## 2016-07-23 DIAGNOSIS — I10 Essential (primary) hypertension: Secondary | ICD-10-CM

## 2016-07-23 NOTE — Patient Instructions (Addendum)
   IF you received an x-ray today, you will receive an invoice from [facility]. Please contact [facility] at [phone] with questions or concerns regarding your invoice.   IF you received labwork today, you will receive an invoice from [facility]. Please contact [facility] at [phone] with questions or concerns regarding your invoice.   Our billing staff will not be able to assist you with questions regarding bills from these companies.  You will be contacted with the lab results as soon as they are available. The fastest way to get your results is to activate your My Chart account. Instructions are located on the last page of this paperwork. If you have not heard from us regarding the results in 2 weeks, please contact this office.      Upper Respiratory Infection, Adult Most upper respiratory infections (URIs) are a viral infection of the air passages leading to the lungs. A URI affects the nose, throat, and upper air passages. The most common type of URI is nasopharyngitis and is typically referred to as "the common cold." URIs run their course and usually go away on their own. Most of the time, a URI does not require medical attention, but sometimes a bacterial infection in the upper airways can follow a viral infection. This is called a secondary infection. Sinus and middle ear infections are common types of secondary upper respiratory infections. Bacterial pneumonia can also complicate a URI. A URI can worsen asthma and chronic obstructive pulmonary disease (COPD). Sometimes, these complications can require emergency medical care and may be life threatening. What are the causes? Almost all URIs are caused by viruses. A virus is a type of germ and can spread from one person to another. What increases the risk? You may be at risk for a URI if:  You smoke.  You have chronic heart or lung disease.  You have a weakened defense (immune) system.  You are very  young or very old.  You have nasal allergies or asthma.  You work in crowded or poorly ventilated areas.  You work in health care facilities or schools. What are the signs or symptoms? Symptoms typically develop 2-3 days after you come in contact with a cold virus. Most viral URIs last 7-10 days. However, viral URIs from the influenza virus (flu virus) can last 14-18 days and are typically more severe. Symptoms may include:  Runny or stuffy (congested) nose.  Sneezing.  Cough.  Sore throat.  Headache.  Fatigue.  Fever.  Loss of appetite.  Pain in your forehead, behind your eyes, and over your cheekbones (sinus pain).  Muscle aches. How is this diagnosed? Your health care provider may diagnose a URI by:  Physical exam.  Tests to check that your symptoms are not due to another condition such as:  Strep throat.  Sinusitis.  Pneumonia.  Asthma. How is this treated? A URI goes away on its own with time. It cannot be cured with medicines, but medicines may be prescribed or recommended to relieve symptoms. Medicines may help:  Reduce your fever.  Reduce your cough.  Relieve nasal congestion. Follow these instructions at home:  Take medicines only as directed by your health care provider.  Gargle warm saltwater or take cough drops to comfort your throat as directed by your health care provider.  Use a warm mist humidifier or inhale steam from a shower to increase air moisture. This may make it easier to breathe.  Drink enough fluid to keep your urine clear   or pale yellow.  Eat soups and other clear broths and maintain good nutrition.  Rest as needed.  Return to work when your temperature has returned to normal or as your health care provider advises. You may need to stay home longer to avoid infecting others. You can also use a face mask and careful hand washing to prevent spread of the virus.  Increase the usage of your inhaler if you have asthma.  Do not  use any tobacco products, including cigarettes, chewing tobacco, or electronic cigarettes. If you need help quitting, ask your health care provider. How is this prevented? The best way to protect yourself from getting a cold is to practice good hygiene.  Avoid oral or hand contact with people with cold symptoms.  Wash your hands often if contact occurs. There is no clear evidence that vitamin C, vitamin E, echinacea, or exercise reduces the chance of developing a cold. However, it is always recommended to get plenty of rest, exercise, and practice good nutrition. Contact a health care provider if:  You are getting worse rather than better.  Your symptoms are not controlled by medicine.  You have chills.  You have worsening shortness of breath.  You have brown or red mucus.  You have yellow or brown nasal discharge.  You have pain in your face, especially when you bend forward.  You have a fever.  You have swollen neck glands.  You have pain while swallowing.  You have white areas in the back of your throat. Get help right away if:  You have severe or persistent:  Headache.  Ear pain.  Sinus pain.  Chest pain.  You have chronic lung disease and any of the following:  Wheezing.  Prolonged cough.  Coughing up blood.  A change in your usual mucus.  You have a stiff neck.  You have changes in your:  Vision.  Hearing.  Thinking.  Mood. This information is not intended to replace advice given to you by your health care provider. Make sure you discuss any questions you have with your health care provider. Document Released: 12/21/2000 Document Revised: 02/28/2016 Document Reviewed: 10/02/2013 Elsevier Interactive Patient Education  2017 Elsevier Inc.  

## 2016-07-23 NOTE — Progress Notes (Signed)
Chief Complaint  Patient presents with  . Follow-up    From visit on 07/20/16. "Slowly feeling better" wants to be sure BP is okay  . 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 He is not exercising and is adherent to low salt diet.  Blood pressure is not well controlled at home. Cardiac symptoms none. Patient denies chest pain, chest pressure/discomfort, claudication, dyspnea, exertional chest pressure/discomfort, fatigue, irregular heart beat, lower extremity edema, near-syncope, orthopnea, palpitations and paroxysmal nocturnal dyspnea.  Cardiovascular risk factors: hypertension and male gender. Use of agents associated with hypertension: decongestants. History of target organ damage: none. He was given Clonidine 0.1mg  in the clinic and his blood pressure improved. He had amlodipine 5mg  added to his Hyzaar 100-25mg .  BP Readings from Last 3 Encounters:  07/23/16 134/84  07/20/16 (!) 154/109  06/16/16 (!) 168/96   URI He reports that in the past 2 days he has had cold sweats and congestion He reports that at this visit on 07/20/16 he was prescribed zpak and is on day 3 He reports that his temperature max was 99 degrees 2 days ago He is also taking mucinex otc   Past Medical History:  Diagnosis Date  . Allergy    [facility]  . Asthma   . Hypertension   . Sleep apnea     Current Outpatient Prescriptions  Medication Sig Dispense Refill  . amLODipine (NORVASC) 5 MG tablet Take 1 tablet (5 mg total) by mouth daily. 90 tablet 3  . azithromycin (ZITHROMAX) 250 MG tablet Sig as indicated 6 tablet 0  . fluticasone (FLONASE) 50 MCG/ACT nasal spray Place 2 sprays into both nostrils daily. 16 g 11  . losartan-hydrochlorothiazide (HYZAAR) 100-25 MG tablet Take 1 tablet by mouth daily. 90 tablet 3  . Multiple Vitamins-Minerals (ADEKS) chewable tablet  Chew 1 tablet by mouth daily.    . cetirizine (ZYRTEC) 10 MG tablet Take 1 tablet (10 mg total) by mouth daily. (Patient not taking: Reported on 07/23/2016) 30 tablet 11  . HYDROcodone-homatropine (HYCODAN) 5-1.5 MG/5ML syrup Take 5 mLs by mouth every 8 (eight) hours as needed for cough. (Patient not taking: Reported on 07/23/2016) 120 mL 0  . tadalafil (CIALIS) 20 MG tablet Take 0.5-1 tablets (10-20 mg total) by mouth every other day as needed for erectile dysfunction. (Patient not taking: Reported on 07/23/2016) 5 tablet 11   No current facility-administered medications for this visit.     Allergies: No Known Allergies  Past Surgical History:  Procedure Laterality Date  . ACHILLES TENDON REPAIR      Social History   Social History  . Marital status: Single    Spouse name: N/A  . Number of children: 1  . Years of education: college   Occupational History  .  [employer]   Social History Main Topics  . Smoking status: Current Every Day Smoker  . Smokeless tobacco: None  . Alcohol use No  . Drug use: No  . Sexual activity: Yes   Other Topics Concern  . None   Social History Narrative   Marital status: married;       Children: none      Lives: with wife.      Employment: works at [employer]; Solicitorclerk.      Tobacco: none      Alcohol: none  Drugs: none      Exercise: gym 4-5 days per week.   Denies  Caffeine use     ROS See hpi  Objective: Vitals:   07/23/16 0907  BP: 134/84  Pulse: 84  Resp: 18  Temp: 98 F (36.7 C)  TempSrc: Oral  SpO2: 99%  Weight: 261 lb (118.4 kg)  Height: 5\' 10"  (1.778 m)    Physical Exam General: alert, oriented, in NAD Head: normocephalic, atraumatic, no sinus tenderness Eyes: EOM intact, no scleral icterus or conjunctival injection Ears: TM clear bilaterally Throat: no pharyngeal exudate or erythema Lymph: no posterior auricular, submental or cervical lymph adenopathy Heart: normal rate, normal sinus rhythm, no  murmurs Lungs: clear to auscultation bilaterally, no wheezing   Assessment and Plan Razi was seen today for follow-up and flu vaccine.  Diagnoses and all orders for this visit:  Essential hypertension- blood pressure improved  Upper respiratory tract infection, unspecified type-  Discussed that he should be able to switch from mucinex to Nyquil to help with night time symptoms Gave work note given him 2 additional days off   Flu vaccine - flu vaccine need  A total of 30 minutes were spent face-to-face with the patient during this encounter and over half of that time was spent on counseling and coordination of care.   Zaina Jenkin A Treven Holtman

## 2016-08-18 ENCOUNTER — Ambulatory Visit (INDEPENDENT_AMBULATORY_CARE_PROVIDER_SITE_OTHER): Payer: Federal, State, Local not specified - PPO | Admitting: Emergency Medicine

## 2016-08-18 VITALS — BP 160/102 | HR 86 | Temp 98.1°F | Resp 17 | Ht 70.0 in | Wt 260.0 lb

## 2016-08-18 DIAGNOSIS — J0100 Acute maxillary sinusitis, unspecified: Secondary | ICD-10-CM | POA: Diagnosis not present

## 2016-08-18 DIAGNOSIS — G4730 Sleep apnea, unspecified: Secondary | ICD-10-CM | POA: Diagnosis not present

## 2016-08-18 DIAGNOSIS — M5431 Sciatica, right side: Secondary | ICD-10-CM | POA: Diagnosis not present

## 2016-08-18 MED ORDER — TRIAMCINOLONE ACETONIDE 40 MG/ML IJ SUSP
80.0000 mg | Freq: Once | INTRAMUSCULAR | Status: AC
  Administered 2016-08-18: 80 mg via INTRAMUSCULAR

## 2016-08-18 MED ORDER — DICLOFENAC SODIUM 75 MG PO TBEC: 75.0000 mg | DELAYED_RELEASE_TABLET | Freq: Two times a day (BID) | ORAL | 0 refills | Status: DC

## 2016-08-18 MED ORDER — TRIAMCINOLONE ACETONIDE 55 MCG/ACT NA AERO: 2.0000 | INHALATION_SPRAY | Freq: Every day | NASAL | 12 refills | Status: DC

## 2016-08-18 MED ORDER — AMOXICILLIN-POT CLAVULANATE 875-125 MG PO TABS: 1.0000 | ORAL_TABLET | Freq: Two times a day (BID) | ORAL | 0 refills | Status: AC

## 2016-08-18 MED ORDER — PREDNISONE 20 MG PO TABS: 40.0000 mg | ORAL_TABLET | Freq: Every day | ORAL | 0 refills | Status: AC

## 2016-08-18 NOTE — Progress Notes (Signed)
[name] 56 y.o.   Chief Complaint  Patient presents with  . Back Pain    onset 1 year/ received cortisone shot last year for this    HISTORY OF PRESENT ILLNESS: This is a 56 y.o. male complaining of right sided low back pain radiating to back of right leg. Also has h/o sleep apnea and needs sleep studies; also c/o chronic sinus issues and feels like he may be developing a sinus infection.  Back Pain  This is a new problem. The current episode started in the past 7 days. The problem occurs constantly. The problem has been waxing and waning since onset. The pain is present in the gluteal and lumbar spine. The quality of the pain is described as aching. The pain radiates to the right thigh and right knee. The pain is at a severity of 7/10. The pain is moderate. The pain is the same all the time. The symptoms are aggravated by bending and position. Associated symptoms include leg pain. Pertinent negatives include no abdominal pain, bladder incontinence, bowel incontinence, chest pain, dysuria, fever, headaches, numbness, paresis, paresthesias, pelvic pain, perianal numbness, tingling or weakness. Risk factors: none.     Prior to Admission medications   Medication Sig Start Date End Date Taking? Authorizing Provider  amLODipine (NORVASC) 5 MG tablet Take 1 tablet (5 mg total) by mouth daily. 07/20/16  Yes Kimanh Templeman Victorino December, MD  fluticasone [facility]) 50 MCG/ACT nasal spray Place 2 sprays into both nostrils daily. 05/05/16  Yes Elvina Sidle, MD  losartan-hydrochlorothiazide (HYZAAR) 100-25 MG tablet Take 1 tablet by mouth daily. 07/20/16  Yes Jonee Lamore Victorino December, MD  Multiple Vitamins-Minerals (ADEKS) chewable tablet Chew 1 tablet by mouth daily.   Yes Historical Provider, MD  amoxicillin-clavulanate (AUGMENTIN) 875-125 MG tablet Take 1 tablet by mouth 2 (two) times daily. 08/18/16 08/25/16  Georgina Quint, MD  diclofenac (VOLTAREN) 75 MG EC tablet Take 1 tablet (75 mg total) by  mouth 2 (two) times daily. 08/18/16   Georgina Quint, MD  predniSONE (DELTASONE) 20 MG tablet Take 2 tablets (40 mg total) by mouth daily with breakfast. 08/18/16 08/23/16  Georgina Quint, MD  triamcinolone (NASACORT AQ) 55 MCG/ACT AERO nasal inhaler Place 2 sprays into the nose daily. 08/18/16   Georgina Quint, MD    No Known Allergies  Patient Active Problem List   Diagnosis Date Noted  . Bone spur of foot 02/08/2016  . RBBB (right bundle branch block) 04/02/2014  . Asthma 01/17/2012  . Environmental allergies 08/16/2011  . Hypertension   . Sleep apnea     Past Medical History:  Diagnosis Date  . Allergy    allergy shots at [facility]  . Asthma   . Hypertension   . Sleep apnea     Past Surgical History:  Procedure Laterality Date  . ACHILLES TENDON REPAIR      Social History   Social History  . Marital status: Single    Spouse name: N/A  . Number of children: 1  . Years of education: college   Occupational History  .  [employer] [employer]   Social History Main Topics  . Smoking status: Current Every Day Smoker  . Smokeless tobacco: Never Used  . Alcohol use No  . Drug use: No  . Sexual activity: Yes   Other Topics Concern  . Not on file   Social History Narrative   Marital status: married;       Children: none  Lives: with wife.      Employment: works at [employer]; Solicitorclerk.      Tobacco: none      Alcohol: none      Drugs: none      Exercise: gym 4-5 days per week.   Denies  Caffeine use     Family History  Problem Relation Age of Onset  . Hypertension Mother   . Cancer Mother   . Hypertension Father      Review of Systems  Constitutional: Negative for fever.  HENT: Negative.   Eyes: Negative.   Respiratory: Negative for cough and shortness of breath.   Cardiovascular: Negative for chest pain and palpitations.  Gastrointestinal: Negative for abdominal pain, bowel incontinence, nausea and vomiting.  Genitourinary:  Negative for bladder incontinence, dysuria, flank pain, hematuria and pelvic pain.  Musculoskeletal: Positive for back pain. Negative for joint pain, myalgias and neck pain.  Skin: Negative for rash.  Neurological: Negative for dizziness, tingling, sensory change, focal weakness, weakness, numbness, headaches and paresthesias.  All other systems reviewed and are negative.  Vitals:   08/18/16 1730  BP: (!) 160/102  Pulse: 86  Resp: 17  Temp: 98.1 F (36.7 C)     Physical Exam  Constitutional: He is oriented to person, place, and time. He appears well-developed and well-nourished.  HENT:  Head: Normocephalic and atraumatic.  Nose: Right sinus exhibits maxillary sinus tenderness. Left sinus exhibits maxillary sinus tenderness.  Mouth/Throat: Oropharynx is clear and moist. No oropharyngeal exudate.  Eyes: Conjunctivae and EOM are normal. Pupils are equal, round, and reactive to light.  Neck: Normal range of motion. Neck supple.  Cardiovascular: Normal rate and regular rhythm.   Pulmonary/Chest: Effort normal and breath sounds normal.  Abdominal: Soft. Bowel sounds are normal. He exhibits no mass. There is no tenderness.  Musculoskeletal: Normal range of motion.       Lumbar back: He exhibits tenderness and spasm. He exhibits no bony tenderness.       Back:  Neurological: He is alert and oriented to person, place, and time. He displays normal reflexes. No sensory deficit. He exhibits normal muscle tone.  Skin: Skin is warm and dry. Capillary refill takes less than 2 seconds.  Vitals reviewed.    ASSESSMENT & PLAN: [name] was seen today for back pain.  Diagnoses and all orders for this visit:  Sciatica of right side -     triamcinolone acetonide (KENALOG-40) injection 80 mg; Inject 2 mLs (80 mg total) into the muscle once.  Sleep apnea, unspecified type -     Ambulatory referral to Sleep Studies  Subacute maxillary sinusitis  Other orders -     triamcinolone (NASACORT AQ)  55 MCG/ACT AERO nasal inhaler; Place 2 sprays into the nose daily. -     amoxicillin-clavulanate (AUGMENTIN) 875-125 MG tablet; Take 1 tablet by mouth 2 (two) times daily. -     diclofenac (VOLTAREN) 75 MG EC tablet; Take 1 tablet (75 mg total) by mouth 2 (two) times daily. -     predniSONE (DELTASONE) 20 MG tablet; Take 2 tablets (40 mg total) by mouth daily with breakfast.     Patient Instructions       IF you received an x-ray today, you will receive an invoice from [facility]. Please contact [facility] at [phone] with questions or concerns regarding your invoice.   IF you received labwork today, you will receive an invoice from [facility]. Please contact [facility] at [phone] with questions or concerns regarding  your invoice.   Our billing staff will not be able to assist you with questions regarding bills from these companies.  You will be contacted with the lab results as soon as they are available. The fastest way to get your results is to activate your My Chart account. Instructions are located on the last page of this paperwork. If you have not heard from [place] regarding the results in 2 weeks, please contact this office.     Sciatica Introduction Sciatica is pain, numbness, weakness, or tingling along your sciatic nerve. The sciatic nerve starts in the lower back and goes down the back of each leg. Sciatica happens when this nerve is pinched or has pressure put on it. Sciatica usually goes away on its own or with treatment. Sometimes, sciatica may keep coming back (recur). Follow these instructions at home: Medicines  Take over-the-counter and prescription medicines only as told by your doctor.  Do not drive or use heavy machinery while taking prescription pain medicine. Managing pain  If directed, put ice on the affected area.  Put ice in a plastic bag.  Place a towel between your skin and the bag.  Leave the ice on for 20 minutes, 2-3 times  a day.  After icing, apply heat to the affected area before you exercise or as often as told by your doctor. Use the heat source that your doctor tells you to use, such as a moist heat pack or a heating pad.  Place a towel between your skin and the heat source.  Leave the heat on for 20-30 minutes.  Remove the heat if your skin turns bright red. This is especially important if you are unable to feel pain, heat, or cold. You may have a greater risk of getting burned. Activity  Return to your normal activities as told by your doctor. Ask your doctor what activities are safe for you.  Avoid activities that make your sciatica worse.  Take short rests during the day. Rest in a lying or standing position. This is usually better than sitting to rest.  When you rest for a long time, do some physical activity or stretching between periods of rest.  Avoid sitting for a long time without moving. Get up and move around at least one time each hour.  Exercise and stretch regularly, as told by your doctor.  Do not lift anything that is heavier than 10 lb (4.5 kg) while you have symptoms of sciatica.  Avoid lifting heavy things even when you do not have symptoms.  Avoid lifting heavy things over and over.  When you lift objects, always lift in a way that is safe for your body. To do this, you should:  Bend your knees.  Keep the object close to your body.  Avoid twisting. General instructions  Use good posture.  Avoid leaning forward when you are sitting.  Avoid hunching over when you are standing.  Stay at a healthy weight.  Wear comfortable shoes that support your feet. Avoid wearing high heels.  Avoid sleeping on a mattress that is too soft or too hard. You might have less pain if you sleep on a mattress that is firm enough to support your back.  Keep all follow-up visits as told by your doctor. This is important. Contact a doctor if:  You have pain that:  Wakes you up when  you are sleeping.  Gets worse when you lie down.  Is worse than the pain you have had in the  past.  Lasts longer than 4 weeks.  You lose weight for without trying. Get help right away if:  You cannot control when you pee (urinate) or poop (have a bowel movement).  You have weakness in any of these areas and it gets worse.  Lower back.  Lower belly (pelvis).  Butt (buttocks).  Legs.  You have redness or swelling of your back.  You have a burning feeling when you pee. This information is not intended to replace advice given to you by your health care provider. Make sure you discuss any questions you have with your health care provider. Document Released: 04/05/2008 Document Revised: 12/03/2015 Document Reviewed: 03/06/2015  2017 Elsevier     Edwina Barth, MD [facility]

## 2016-08-18 NOTE — Patient Instructions (Addendum)
   IF you received an x-ray today, you will receive an invoice from [facility]. Please contact [facility] at [phone] with questions or concerns regarding your invoice.   IF you received labwork today, you will receive an invoice from [facility]. Please contact [facility] at [phone] with questions or concerns regarding your invoice.   Our billing staff will not be able to assist you with questions regarding bills from these companies.  You will be contacted with the lab results as soon as they are available. The fastest way to get your results is to activate your My Chart account. Instructions are located on the last page of this paperwork. If you have not heard from us regarding the results in 2 weeks, please contact this office.     Sciatica Introduction Sciatica is pain, numbness, weakness, or tingling along your sciatic nerve. The sciatic nerve starts in the lower back and goes down the back of each leg. Sciatica happens when this nerve is pinched or has pressure put on it. Sciatica usually goes away on its own or with treatment. Sometimes, sciatica may keep coming back (recur). Follow these instructions at home: Medicines  Take over-the-counter and prescription medicines only as told by your doctor.  Do not drive or use heavy machinery while taking prescription pain medicine. Managing pain  If directed, put ice on the affected area.  Put ice in a plastic bag.  Place a towel between your skin and the bag.  Leave the ice on for 20 minutes, 2-3 times a day.  After icing, apply heat to the affected area before you exercise or as often as told by your doctor. Use the heat source that your doctor tells you to use, such as a moist heat pack or a heating pad.  Place a towel between your skin and the heat source.  Leave the heat on for 20-30 minutes.  Remove the heat if your skin turns bright red. This is especially important if you are unable to feel  pain, heat, or cold. You may have a greater risk of getting burned. Activity  Return to your normal activities as told by your doctor. Ask your doctor what activities are safe for you.  Avoid activities that make your sciatica worse.  Take short rests during the day. Rest in a lying or standing position. This is usually better than sitting to rest.  When you rest for a long time, do some physical activity or stretching between periods of rest.  Avoid sitting for a long time without moving. Get up and move around at least one time each hour.  Exercise and stretch regularly, as told by your doctor.  Do not lift anything that is heavier than 10 lb (4.5 kg) while you have symptoms of sciatica.  Avoid lifting heavy things even when you do not have symptoms.  Avoid lifting heavy things over and over.  When you lift objects, always lift in a way that is safe for your body. To do this, you should:  Bend your knees.  Keep the object close to your body.  Avoid twisting. General instructions  Use good posture.  Avoid leaning forward when you are sitting.  Avoid hunching over when you are standing.  Stay at a healthy weight.  Wear comfortable shoes that support your feet. Avoid wearing high heels.  Avoid sleeping on a mattress that is too soft or too hard. You might have less pain if you sleep on a mattress that is firm   enough to support your back.  Keep all follow-up visits as told by your doctor. This is important. Contact a doctor if:  You have pain that:  Wakes you up when you are sleeping.  Gets worse when you lie down.  Is worse than the pain you have had in the past.  Lasts longer than 4 weeks.  You lose weight for without trying. Get help right away if:  You cannot control when you pee (urinate) or poop (have a bowel movement).  You have weakness in any of these areas and it gets worse.  Lower back.  Lower belly (pelvis).  Butt (buttocks).  Legs.  You  have redness or swelling of your back.  You have a burning feeling when you pee. This information is not intended to replace advice given to you by your health care provider. Make sure you discuss any questions you have with your health care provider. Document Released: 04/05/2008 Document Revised: 12/03/2015 Document Reviewed: 03/06/2015  2017 Elsevier  

## 2016-08-25 DIAGNOSIS — G5602 Carpal tunnel syndrome, left upper limb: Secondary | ICD-10-CM | POA: Diagnosis not present

## 2016-08-25 DIAGNOSIS — M5417 Radiculopathy, lumbosacral region: Secondary | ICD-10-CM | POA: Diagnosis not present

## 2016-08-25 DIAGNOSIS — M5412 Radiculopathy, cervical region: Secondary | ICD-10-CM | POA: Diagnosis not present

## 2016-08-25 DIAGNOSIS — M79604 Pain in right leg: Secondary | ICD-10-CM | POA: Diagnosis not present

## 2016-09-03 ENCOUNTER — Ambulatory Visit (HOSPITAL_COMMUNITY)
Admission: EM | Admit: 2016-09-03 | Discharge: 2016-09-03 | Disposition: A | Discharge status: Home / Self Care | Payer: Federal, State, Local not specified - PPO | Attending: Family Medicine | Admitting: Family Medicine

## 2016-09-03 ENCOUNTER — Encounter (HOSPITAL_COMMUNITY): Payer: Self-pay | Admitting: Family Medicine

## 2016-09-03 DIAGNOSIS — I10 Essential (primary) hypertension: Secondary | ICD-10-CM

## 2016-09-03 DIAGNOSIS — J0101 Acute recurrent maxillary sinusitis: Secondary | ICD-10-CM

## 2016-09-03 MED ORDER — FLUTICASONE PROPIONATE 50 MCG/ACT NA SUSP: 2.0000 | Freq: Every day | NASAL | 11 refills | Status: DC

## 2016-09-03 MED ORDER — AMOXICILLIN 875 MG PO TABS: 875.0000 mg | ORAL_TABLET | Freq: Two times a day (BID) | ORAL | 0 refills | Status: DC

## 2016-09-03 NOTE — ED Provider Notes (Signed)
[facility]    CSN: [id number] Arrival date & time: 09/03/16  1203     History   Chief Complaint Chief Complaint  Patient presents with  . Recurrent Sinusitis    HPI Dylan Washington is a 56 y.o. male.   This a 56 year old man presents with several days of sinus congestion and facial pain. He's undergone some sinus surgery in the past. He also uses a CPAP machine. He's being treated for hypertension as well.  Patient works for the [employer] in a dusty environment. He has not taken his blood pressure today yet.  Patient is in the process of moving to [place]. He still has not sold his house here.      Past Medical History:  Diagnosis Date  . Allergy    allergy shots at [facility]  . Asthma   . Hypertension   . Sleep apnea     Patient Active Problem List   Diagnosis Date Noted  . Bone spur of foot 02/08/2016  . RBBB (right bundle branch block) 04/02/2014  . Asthma 01/17/2012  . Environmental allergies 08/16/2011  . Hypertension   . Sleep apnea     Past Surgical History:  Procedure Laterality Date  . ACHILLES TENDON REPAIR         Home Medications    Prior to Admission medications   Medication Sig Start Date End Date Taking? Authorizing Provider  amLODipine (NORVASC) 5 MG tablet Take 1 tablet (5 mg total) by mouth daily. 07/20/16  Yes Miguel Victorino December, MD  losartan-hydrochlorothiazide (HYZAAR) 100-25 MG tablet Take 1 tablet by mouth daily. 07/20/16  Yes Miguel Victorino December, MD  amoxicillin (AMOXIL) 875 MG tablet Take 1 tablet (875 mg total) by mouth 2 (two) times daily. 09/03/16   Elvina Sidle, MD  diclofenac (VOLTAREN) 75 MG EC tablet Take 1 tablet (75 mg total) by mouth 2 (two) times daily. 08/18/16   Georgina Quint, MD  fluticasone [facility]) 50 MCG/ACT nasal spray Place 2 sprays into both nostrils daily. 09/03/16   Elvina Sidle, MD  Multiple Vitamins-Minerals (ADEKS) chewable tablet Chew 1 tablet by mouth daily.     Historical Provider, MD  triamcinolone (NASACORT AQ) 55 MCG/ACT AERO nasal inhaler Place 2 sprays into the nose daily. 08/18/16   Georgina Quint, MD    Family History Family History  Problem Relation Age of Onset  . Hypertension Mother   . Cancer Mother   . Hypertension Father     Social History Social History  Substance Use Topics  . Smoking status: Current Every Day Smoker  . Smokeless tobacco: Never Used  . Alcohol use No     Allergies   Patient has no known allergies.   Review of Systems Review of Systems  Constitutional: Negative.   HENT: Positive for congestion, sinus pain and sinus pressure.   Respiratory: Positive for cough.   Neurological: Negative.      Physical Exam Triage Vital Signs ED Triage Vitals [09/03/16 1221]  Enc Vitals Group     BP (!) 177/114     Pulse Rate 76     Resp      Temp 98.6 F (37 C)     Temp Source Oral     SpO2 100 %     Weight      Height      Head Circumference      Peak Flow      Pain Score 5     Pain Loc  Pain Edu?      Excl. in GC?    No data found.   Updated Vital Signs BP (!) 177/114 (BP Location: Left Arm)   Pulse 76   Temp 98.6 F (37 C) (Oral)   SpO2 100%    Physical Exam  Constitutional: He is oriented to person, place, and time. He appears well-developed and well-nourished.  HENT:  Head: Normocephalic.  Right Ear: External ear normal.  Left Ear: External ear normal.  Mouth/Throat: Oropharynx is clear and moist.  Mildly inflamed nasal passages bilaterally  Eyes: Conjunctivae and EOM are normal. Pupils are equal, round, and reactive to light.  Neck: Normal range of motion. Neck supple.  Cardiovascular: Normal rate, regular rhythm and normal heart sounds.   Pulmonary/Chest: Effort normal and breath sounds normal.  Musculoskeletal: Normal range of motion.  Neurological: He is alert and oriented to person, place, and time.  Skin: Skin is warm and dry.  Nursing note and vitals  reviewed.    UC Treatments / Results  Labs (all labs ordered are listed, but only abnormal results are displayed) Labs Reviewed - No data to display  EKG  EKG Interpretation None       Radiology No results found.  Procedures Procedures (including critical care time)  Medications Ordered in UC Medications - No data to display   Initial Impression / Assessment and Plan / UC Course  I have reviewed the triage vital signs and the nursing notes.  Pertinent labs & imaging results that were available during my care of the patient were reviewed by me and considered in my medical decision making (see chart for details).      Final Clinical Impressions(s) / UC Diagnoses   Final diagnoses:  Acute recurrent maxillary sinusitis  Essential hypertension    New Prescriptions New Prescriptions   AMOXICILLIN (AMOXIL) 875 MG TABLET    Take 1 tablet (875 mg total) by mouth 2 (two) times daily.     Elvina SidleKurt Jarrod Mcenery, MD 09/03/16 1240

## 2016-09-03 NOTE — ED Triage Notes (Signed)
Pt suffering from sinus pain and pressure.  States he gets them all the time and wanted to catch it early.  He denies any fever.

## 2016-09-03 NOTE — Discharge Instructions (Signed)
Get back on your medication as soon as you get home.

## 2016-09-13 ENCOUNTER — Encounter: Payer: Self-pay | Admitting: Neurology

## 2016-09-13 ENCOUNTER — Telehealth: Payer: Self-pay | Admitting: Neurology

## 2016-09-13 ENCOUNTER — Ambulatory Visit (INDEPENDENT_AMBULATORY_CARE_PROVIDER_SITE_OTHER): Payer: Federal, State, Local not specified - PPO | Admitting: Neurology

## 2016-09-13 VITALS — BP 168/108 | HR 84 | Resp 18 | Ht 70.0 in | Wt 270.0 lb

## 2016-09-13 DIAGNOSIS — R351 Nocturia: Secondary | ICD-10-CM

## 2016-09-13 DIAGNOSIS — G4733 Obstructive sleep apnea (adult) (pediatric): Secondary | ICD-10-CM

## 2016-09-13 DIAGNOSIS — R635 Abnormal weight gain: Secondary | ICD-10-CM

## 2016-09-13 DIAGNOSIS — E669 Obesity, unspecified: Secondary | ICD-10-CM | POA: Diagnosis not present

## 2016-09-13 DIAGNOSIS — R51 Headache: Secondary | ICD-10-CM | POA: Diagnosis not present

## 2016-09-13 DIAGNOSIS — G4726 Circadian rhythm sleep disorder, shift work type: Secondary | ICD-10-CM | POA: Diagnosis not present

## 2016-09-13 DIAGNOSIS — R519 Headache, unspecified: Secondary | ICD-10-CM

## 2016-09-13 NOTE — Progress Notes (Signed)
Subjective:    Patient ID: Dylan Washington is a 56 y.o. male.  HPI      Huston Foley, MD, PhD [facility] [address] [address] [place], [place] [address]  Dear Dr. Alvy Bimler,    I saw your patient, Dylan Washington, upon your kind request in my neurologic clinic today for initial consultation of his sleep disorder, in particular re-evaluation of his obstructive sleep apnea. The patient is unaccompanied today. As you know, Dylan Washington is a 56 year old right-handed gentleman with an underlying medical history of allergies, asthma, hypertension, obesity, right bundle branch block, and low back pain, whom I have previously seen for sleep apnea on 04/20/2015. He was advised to return for a sleep study. Of note, he no showed for sleep study testing on 05/17/2015 as well as 06/07/2015. He was originally diagnosed with obstructive sleep apnea over 10 years ago. He had sleep study testing in April 2011. This was interpreted by Dr. Maple Hudson. I reviewed his CPAP download from his compliance card from the machine but the report has another name and date of birth on it. He has used the CPAP 18 out of 30 days in the past month. Average usage of about 6 hours and 15 minutes, has not used CPAP since essentially 09/02/2016. He reports that he cannot use the current mask. He notices air leakage from the mouth and drooling at night, he does not wake up rested. He works for the [employer], works Sunday through Thursday, 11 PM to 7:30 AM, bedtime is around 8 AM. On Friday nights and Saturday nights he is in bed typically around midnight and sleeps till 5 or so but sleep is interrupted, he has nocturia at least 3 times per night on average and has occasional morning headaches. He does not drink alcohol, has reduced his smoking, smokes about 3 cigarettes per day and does not typically have any caffeine on a day-to-day basis. He has gained weight recently. He lives with his girlfriend. I  reviewed your office note from 08/18/2016.  Previously (copied from previous notes for reference):    04/20/2015: Dylan Washington is a 56 year old right-handed gentleman with an underlying medical history of hypertension, prior smoking until 2012, asthma, allergies, and obesity, who was previously diagnosed with obstructive sleep apnea in 2007. He was originally seen by Dr. Vickey Huger and last seen by her in October 2015. He was on CPAP and was placed on AutoPap therapy at the time. His original sleep study results from 2007 are not available for my review. I reviewed your office note from 04/16/2015. He had another sleep study with CPAP titration on 10/20/09, interpreted by Dr. Maple Hudson, which I reviewed: This was a split-night sleep study. His total AHI at baseline was 17.1 per hour, oxygen desaturation nadir was 68%. He was titrated on CPAP up to 17 cm and his AHI was 0 per hour on that pressure, mean oxygen saturation while on CPAP was 94.5%.  His Past Medical History Is Significant For: Past Medical History:  Diagnosis Date  . Allergy    allergy shots at [facility]  . Asthma   . Hypertension   . Sleep apnea     His Past Surgical History Is Significant For: Past Surgical History:  Procedure Laterality Date  . ACHILLES TENDON REPAIR    . NASAL SINUS SURGERY      His Family History Is Significant For: Family History  Problem Relation Age of Onset  . Hypertension Mother   .  Cancer Mother   . Hypertension Father     His Social History Is Significant For: Social History   Social History  . Marital status: Single    Spouse name: N/A  . Number of children: 0  . Years of education: college   Occupational History  .  [employer]   Social History Main Topics  . Smoking status: Light Tobacco Smoker  . Smokeless tobacco: Never Used     Comment: 3 cig a day   . Alcohol use No  . Drug use: No  . Sexual activity: Yes   Other Topics Concern  . None   Social History  Narrative   Marital status: married;       Children: none      Lives: with wife.      Employment: works at [employer]; Solicitor.      Tobacco: none      Alcohol: none      Drugs: none      Exercise: gym 4-5 days per week.   Denies  Caffeine use     His Allergies Are:  No Known Allergies:   His Current Medications Are:  Outpatient Encounter Prescriptions as of 09/13/2016  Medication Sig  . amLODipine (NORVASC) 5 MG tablet Take 1 tablet (5 mg total) by mouth daily.  . diclofenac (VOLTAREN) 75 MG EC tablet Take 1 tablet (75 mg total) by mouth 2 (two) times daily.  . fluticasone (FLONASE) 50 MCG/ACT nasal spray Place 2 sprays into both nostrils daily.  Marland Kitchen losartan-hydrochlorothiazide (HYZAAR) 100-25 MG tablet Take 1 tablet by mouth daily.  . Multiple Vitamins-Minerals (ADEKS) chewable tablet Chew 1 tablet by mouth daily.  Marland Kitchen triamcinolone (NASACORT AQ) 55 MCG/ACT AERO nasal inhaler Place 2 sprays into the nose daily.  . [DISCONTINUED] amoxicillin (AMOXIL) 875 MG tablet Take 1 tablet (875 mg total) by mouth 2 (two) times daily.   No facility-administered encounter medications on file as of 09/13/2016.   :  Review of Systems:  Out of a complete 14 point review of systems, all are reviewed and negative with the exception of these symptoms as listed below: Review of Systems  Neurological:       Patient states that he has not used his CPAP in a couple of weeks.  He is willing to repeat a sleep study so he can get a new machine and have the correct settings.  Has trouble sleeping without CPAP, snores, witnessed apnea, wakes up feeling tired, daytime fatigue, denies taking naps.    Epworth Sleepiness Scale 0= would never doze 1= slight chance of dozing 2= moderate chance of dozing 3= high chance of dozing  Sitting and reading:2 Watching TV:2 Sitting inactive in a public place (ex. Theater or meeting):1 As a passenger in a car for an hour without a break:0 Lying down to rest in the  afternoon:1 Sitting and talking to someone:0 Sitting quietly after lunch (no alcohol):1 In a car, while stopped in traffic:0 Total:7  Objective:  Neurologic Exam  Physical Exam Physical Examination:   Vitals:   09/13/16 1102  BP: (!) 168/108  Pulse: 84  Resp: 18    General Examination: The patient is a very pleasant 56 y.o. male in no acute distress. He appears well-developed and well-nourished and well groomed.   HEENT: Normocephalic, atraumatic, pupils are equal, round and reactive to light and accommodation. Funduscopic exam is normal with sharp disc margins noted. Extraocular tracking is good without limitation to gaze excursion or nystagmus noted.  Normal smooth pursuit is noted. Hearing is grossly intact. Face is symmetric with normal facial animation and normal facial sensation. Speech is clear with no dysarthria noted. There is no hypophonia. There is no lip, neck/head, jaw or voice tremor. Neck is supple with full range of passive and active motion. There are no carotid bruits on auscultation. Oropharynx exam reveals: mild mouth dryness, adequate dental hygiene and moderate airway crowding, due to larger tongue, smaller airway entry, larger uvula. Mild pharyngeal erythema. Mallampati is class II. Tongue protrudes centrally and palate elevates symmetrically. Tonsils are 1+ in size. Neck size is 19 inches. He has a Mild overbite. Nasal inspection reveals no significant nasal mucosal bogginess or redness and no septal deviation.   Chest: Clear to auscultation without wheezing, rhonchi or crackles noted.  Heart: S1+S2+0, regular and normal without murmurs, rubs or gallops noted.   Abdomen: Soft, non-tender and non-distended with normal bowel sounds appreciated on auscultation.  Extremities: There is no pitting edema in the distal lower extremities bilaterally. Pedal pulses are intact.  Skin: Warm and dry without trophic changes noted.  Musculoskeletal: exam reveals no obvious  joint deformities, tenderness or joint swelling or erythema.   Neurologically:  Mental status: The patient is awake, alert and oriented in all 4 spheres. His immediate and remote memory, attention, language skills and fund of knowledge are appropriate. There is no evidence of aphasia, agnosia, apraxia or anomia. Speech is clear with normal prosody and enunciation. Thought process is linear. Mood is normal and affect is normal.  Cranial nerves II - XII are as described above under HEENT exam. In addition: shoulder shrug is normal with equal shoulder height noted. Motor exam: Normal bulk, strength and tone is noted. There is no drift, tremor or rebound. Romberg is negative. Reflexes are 1-2+ throughout. Fine motor skills and coordination: intact with normal finger taps, normal hand movements, normal rapid alternating patting, normal foot taps and normal foot agility.  Cerebellar testing: No dysmetria or intention tremor on finger to nose testing. Heel to shin is unremarkable bilaterally. There is no truncal or gait ataxia.  Sensory exam: intact to light touch in the upper and lower extremities.  Gait, station and balance: He stands easily. No veering to one side is noted. No leaning to one side is noted. Posture is age-appropriate and stance is narrow based. Gait shows normal stride length and normal pace. No problems turning are noted. Tandem walk is unremarkable.            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 He has had interim weight gain, also reports morning headaches and nocturia. I had a long chat with the patient about my findings and the diagnosis of OSA, its prognosis and treatment  options. We talked about medical treatments, surgical interventions and non-pharmacological approaches. I explained in particular the risks and ramifications of untreated moderate to severe OSA, especially with respect to developing cardiovascular disease down the Road, including congestive heart failure, difficult to treat hypertension, cardiac arrhythmias, or stroke. Even type 2 diabetes has, in part, been linked to untreated OSA. Symptoms of untreated OSA include daytime sleepiness, memory problems, mood irritability and mood disorder such as depression and anxiety, lack of energy, as  well as recurrent headaches, especially morning headaches. We talked about smoking cessation and trying to maintain a healthy lifestyle in general, as well as the importance of weight control. I encouraged the patient to eat healthy, exercise daily and keep well hydrated, to keep a scheduled bedtime and wake time routine, to not skip any meals and eat healthy snacks in between meals. I advised the patient not to drive when feeling sleepy. I recommended the following at this time: sleep study with potential positive airway pressure titration. (We will score hypopneas at 3%).   I explained the sleep test procedure to the patient and also outlined possible surgical and non-surgical treatment options of OSA, including the use of a custom-made dental device (which would require a referral to a specialist dentist or oral surgeon), upper airway surgical options, such as pillar implants, radiofrequency surgery, tongue base surgery, and UPPP (which would involve a referral to an ENT surgeon). Rarely, jaw surgery such as mandibular advancement may be considered.  I also explained the CPAP treatment option to the patient, who indicated that he would be willing use CPAP, but would prefer a FFM. I explained the importance of being compliant with PAP treatment, not only for insurance purposes but primarily to improve His symptoms, and for the  patient's long term health benefit, including to reduce His cardiovascular risks. I answered all him questions today and the patient was in agreement. I would like to see him back after the sleep study is completed and encouraged him to call with any interim questions, concerns, problems or updates.   Thank you very much for allowing me to participate in the care of this nice patient. If I can be of any further assistance to you please do not hesitate to call me at [phone].  Sincerely,   Huston FoleySaima Dorian Duval, MD, PhD

## 2016-09-13 NOTE — Telephone Encounter (Signed)
Patient called office in reference to needing a work note stating he was seen in our office today.  Please call patient when ready for pick up.

## 2016-09-13 NOTE — Patient Instructions (Signed)
I will again order a sleep study. Please remember, the risks and ramifications of moderate to severe obstructive sleep apnea or OSA are: Cardiovascular disease, including congestive heart failure, stroke, difficult to control hypertension, arrhythmias, and even type 2 diabetes has been linked to untreated OSA. Sleep apnea causes disruption of sleep and sleep deprivation in most cases, which, in turn, can cause recurrent headaches, problems with memory, mood, concentration, focus, and vigilance. Most people with untreated sleep apnea report excessive daytime sleepiness, which can affect their ability to drive. Please do not drive if you feel sleepy.   I will likely see you back after your sleep study to go over the test results and where to go from there. We will call you after your sleep study to advise about the results (most likely, you will hear from Lafonda Mossesiana, my nurse) and to set up an appointment at the time, as necessary.    Our sleep lab administrative assistant, Alvis LemmingsDawn will meet with you or call you to schedule your sleep study. If you don't hear back from her by next week please feel free to call her at [phone]. This is her direct line and please leave a message with your phone number to call back if you get the voicemail box. She will call back as soon as possible.

## 2016-09-14 ENCOUNTER — Encounter: Payer: Self-pay | Admitting: Neurology

## 2016-09-14 NOTE — Telephone Encounter (Signed)
Pt called back reg note, I advised him anyone at check in can do that for him. He understood

## 2016-09-25 ENCOUNTER — Ambulatory Visit (HOSPITAL_COMMUNITY)
Admission: EM | Admit: 2016-09-25 | Discharge: 2016-09-25 | Disposition: A | Discharge status: Home / Self Care | Payer: Federal, State, Local not specified - PPO | Attending: Family Medicine | Admitting: Family Medicine

## 2016-09-25 ENCOUNTER — Encounter (HOSPITAL_COMMUNITY): Payer: Self-pay | Admitting: Emergency Medicine

## 2016-09-25 DIAGNOSIS — J0100 Acute maxillary sinusitis, unspecified: Secondary | ICD-10-CM | POA: Diagnosis not present

## 2016-09-25 NOTE — ED Triage Notes (Signed)
The patient presented to the [facility] with a complaint of sinus pain and pressure x 2 days.  

## 2016-09-25 NOTE — ED Provider Notes (Signed)
[facility]    CSN: [id number] Arrival date & time: 09/25/16  1610     History   Chief Complaint Chief Complaint  Patient presents with  . Facial Pain    HPI Dylan Washington is a 56 y.o. male.   [facility] a [facility] of sinus pain and pressure x 12 days. He was treated on February 25 for sinus infection and now feels completely clear.  Before that he was having back problems with sciatica. That back problem is cleared as well.  Patient works in [place] at [employer]. He wants return to work there and he's moving to that area shortly.      Past Medical History:  Diagnosis Date  . Allergy    allergy shots at [facility]  . Asthma   . Hypertension   . Sleep apnea     Patient Active Problem List   Diagnosis Date Noted  . Bone spur of foot 02/08/2016  . RBBB (right bundle branch block) 04/02/2014  . Asthma 01/17/2012  . Environmental allergies 08/16/2011  . Hypertension   . Sleep apnea     Past Surgical History:  Procedure Laterality Date  . ACHILLES TENDON REPAIR    . NASAL SINUS SURGERY         Home Medications    Prior to Admission medications   Medication Sig Start Date End Date Taking? Authorizing Provider  amLODipine (NORVASC) 5 MG tablet Take 1 tablet (5 mg total) by mouth daily. 07/20/16   Miguel Victorino DecemberJose Sagardia, MD  diclofenac (VOLTAREN) 75 MG EC tablet Take 1 tablet (75 mg total) by mouth 2 (two) times daily. 08/18/16   Georgina QuintMiguel Jose Sagardia, MD  fluticasone [facility](FLONASE) 50 MCG/ACT nasal spray Place 2 sprays into both nostrils daily. 09/03/16   Elvina SidleKurt Malikiah Debarr, MD  losartan-hydrochlorothiazide (HYZAAR) 100-25 MG tablet Take 1 tablet by mouth daily. 07/20/16   Georgina QuintMiguel Jose Sagardia, MD  Multiple Vitamins-Minerals (ADEKS) chewable tablet Chew 1 tablet by mouth daily.    Historical Provider, MD  triamcinolone (NASACORT AQ) 55 MCG/ACT AERO nasal inhaler Place 2 sprays into the nose daily. 08/18/16    Georgina QuintMiguel Jose Sagardia, MD    Family History Family History  Problem Relation Age of Onset  . Hypertension Mother   . Cancer Mother   . Hypertension Father     Social History Social History  Substance Use Topics  . Smoking status: Light Tobacco Smoker  . Smokeless tobacco: Never Used     Comment: 3 cig a day   . Alcohol use No     Allergies   Patient has no known allergies.   Review of Systems Review of Systems  All other systems reviewed and are negative.    Physical Exam Triage Vital Signs ED Triage Vitals  Enc Vitals Group     BP 09/25/16 1705 (!) 163/99     Pulse Rate 09/25/16 1705 89     Resp 09/25/16 1705 20     Temp 09/25/16 1705 98.5 F (36.9 C)     Temp Source 09/25/16 1705 Oral     SpO2 09/25/16 1705 100 %     Weight --      Height --      Head Circumference --      Peak Flow --      Pain Score 09/25/16 1704 0     Pain Loc --      Pain Edu? --  Excl. in GC? --    No data found.   Updated Vital Signs BP (!) 163/99 (BP Location: Right Arm)   Pulse 89   Temp 98.5 F (36.9 C) (Oral)   Resp 20   SpO2 100%   \ Physical Exam  Constitutional: He is oriented to person, place, and time. He appears well-developed and well-nourished.  HENT:  Right Ear: External ear normal.  Left Ear: External ear normal.  Mouth/Throat: Oropharynx is clear and moist.  Eyes: Conjunctivae and EOM are normal. Pupils are equal, round, and reactive to light.  Neck: Normal range of motion. Neck supple.  Pulmonary/Chest: Effort normal.  Musculoskeletal: Normal range of motion.  Neurological: He is alert and oriented to person, place, and time.  Skin: Skin is warm and dry.  Nursing note and vitals reviewed.    UC Treatments / Results  Labs (all labs ordered are listed, but only abnormal results are displayed) Labs Reviewed - No data to display  EKG  EKG Interpretation None       Radiology No results found.  Procedures Procedures (including critical  care time)  Medications Ordered in UC Medications - No data to display   Initial Impression / Assessment and Plan / UC Course  I have reviewed the triage vital signs and the nursing notes.  Pertinent labs & imaging results that were available during my care of the patient were reviewed by me and considered in my medical decision making (see chart for details).     Final Clinical Impressions(s) / UC Diagnoses   Final diagnoses:  Acute maxillary sinusitis, recurrence not specified  Sinusitis has resolved.  New Prescriptions New Prescriptions   No medications on file     Elvina Sidle, MD 09/25/16 1759

## 2016-09-25 NOTE — ED Notes (Signed)
Patient discharged by provider.

## 2016-10-14 ENCOUNTER — Ambulatory Visit (INDEPENDENT_AMBULATORY_CARE_PROVIDER_SITE_OTHER): Payer: Federal, State, Local not specified - PPO | Admitting: Neurology

## 2016-10-14 DIAGNOSIS — G4733 Obstructive sleep apnea (adult) (pediatric): Secondary | ICD-10-CM

## 2016-10-14 DIAGNOSIS — G4761 Periodic limb movement disorder: Secondary | ICD-10-CM

## 2016-10-14 DIAGNOSIS — G4720 Circadian rhythm sleep disorder, unspecified type: Secondary | ICD-10-CM

## 2016-10-17 ENCOUNTER — Telehealth: Payer: Self-pay | Admitting: Neurology

## 2016-10-17 NOTE — Telephone Encounter (Addendum)
Pt called said he has been transferred with [employer] [place] starting this Wednesday 10/19/16. Pt said he really needs to get the sleep results asap and the CPAP. Pt is aware the results can take 10-14 days.

## 2016-10-19 ENCOUNTER — Encounter: Payer: Self-pay | Admitting: Physician Assistant

## 2016-10-19 ENCOUNTER — Ambulatory Visit (INDEPENDENT_AMBULATORY_CARE_PROVIDER_SITE_OTHER): Payer: Federal, State, Local not specified - PPO | Admitting: Family Medicine

## 2016-10-19 VITALS — BP 140/100 | HR 94 | Temp 99.2°F | Resp 18 | Ht 70.0 in | Wt 256.2 lb

## 2016-10-19 DIAGNOSIS — R519 Headache, unspecified: Secondary | ICD-10-CM

## 2016-10-19 DIAGNOSIS — R51 Headache: Secondary | ICD-10-CM

## 2016-10-19 DIAGNOSIS — I10 Essential (primary) hypertension: Secondary | ICD-10-CM

## 2016-10-19 MED ORDER — PROMETHAZINE HCL 25 MG/ML IJ SOLN
12.5000 mg | Freq: Once | INTRAMUSCULAR | Status: AC
Start: 2016-10-19 — End: 2016-10-19
  Administered 2016-10-19: 12.5 mg via INTRAMUSCULAR

## 2016-10-19 MED ORDER — PROMETHAZINE HCL 12.5 MG PO TABS: 12.5000 mg | ORAL_TABLET | Freq: Once | ORAL | Status: DC

## 2016-10-19 MED ORDER — AMLODIPINE BESYLATE 10 MG PO TABS: 10.0000 mg | ORAL_TABLET | Freq: Every day | ORAL | 0 refills | Status: DC

## 2016-10-19 MED ORDER — KETOROLAC TROMETHAMINE 30 MG/ML IJ SOLN
30.0000 mg | Freq: Once | INTRAMUSCULAR | Status: AC
  Administered 2016-10-19: 30 mg via INTRAMUSCULAR

## 2016-10-19 NOTE — Patient Instructions (Addendum)
Your headache is most likely a rebound headache from taking so many analgesic (pain) medications. STOP taking ibuprofen, aspirin, aleve, advil, and Tylenol.  If you note your headache has not improved or begins to worsen, please follow up with [place] tomorrow.  If you note one sided weakness, numbness, tingling, blurred vision, please seek care immediately.  Increase your amlodipine to  daily, you can take 2 of your current tablets until you finish the  tablets.  Follow up with your PCP in 1 month for a blood pressure check.   Analgesic Rebound Headache An analgesic rebound headache, sometimes called a medication overuse headache, is a headache that comes after pain medicine (analgesic) taken to treat the original (primary) headache has worn off. Any type of primary headache can return as a rebound headache if a person regularly takes analgesics more than three times a week to treat it. The types of primary headaches that are commonly associated with rebound headaches include:  Migraines.  Headaches that arise from tense muscles in the head and neck area (tension headaches).  Headaches that develop and happen again (recur) on one side of the head and around the eye (cluster headaches). If rebound headaches continue, they become chronic daily headaches. What are the causes? This condition may be caused by frequent use of:  Over-the-counter medicines such as aspirin, ibuprofen, and acetaminophen.  Sinus relief medicines and other medicines that contain caffeine.  Narcotic pain medicines such as codeine and oxycodone. What are the signs or symptoms? The symptoms of a rebound headache are the same as the symptoms of the original headache. Some of the symptoms of specific types of headaches include: Migraine headache   Pulsing or throbbing pain on one or both sides of the head.  Severe pain that interferes with daily activities.  Pain that is worsened by physical  activity.  Nausea, vomiting, or both.  Pain with exposure to bright light, loud noises, or strong smells.  General sensitivity to bright light, loud noises, or strong smells.  Visual changes.  Numbness of one or both arms. Tension headache   Pressure around the head.  Dull, aching head pain.  Pain felt over the front and sides of the head.  Tenderness in the muscles of the head, neck, and shoulders. Cluster headache   Severe pain that begins in or around one eye or temple.  Redness and tearing in the eye on the same side as the pain.  Droopy or swollen eyelid.  One-sided head pain.  Nausea.  Runny nose.  Sweaty, pale facial skin.  Restlessness. How is this diagnosed? This condition is diagnosed by:  Reviewing your medical history. This includes the nature of your primary headaches.  Reviewing the types of pain medicines that you have been using to treat your headaches and how often you take them. How is this treated? This condition may be treated or managed by:  Discontinuing frequent use of the analgesic medicine. Doing this may worsen your headaches at first, but the pain should eventually become more manageable, less frequent, and less severe.  Seeing a headache specialist. He or she may be able to help you manage your headaches and help make sure there is not another cause of the headaches.  Using methods of stress relief, such as acupuncture, counseling, biofeedback, and massage. Talk with your health care provider about which methods might be good for you. Follow these instructions at home:  Take over-the-counter and prescription medicines only as told by your health care provider.  Stop the repeated use of pain medicine as told by your health care provider. Stopping can be difficult. Carefully follow instructions from your health care provider.  Avoid triggers that are known to cause your primary headaches.  Keep all follow-up visits as told by your  health care provider. This is important. Contact a health care provider if:  You continue to experience headaches after following treatments that your health care provider recommended. Get help right away if:  You develop new headache pain.  You develop headache pain that is different than what you have experienced in the past.  You develop numbness or tingling in your arms or legs.  You develop changes in your speech or vision. This information is not intended to replace advice given to you by your health care provider. Make sure you discuss any questions you have with your health care provider. Document Released: 09/17/2003 Document Revised: 01/15/2016 Document Reviewed: 11/30/2015 Elsevier Interactive Patient Education  2017 [employer].     IF you received an x-ray today, you will receive an invoice from [facility]. Please contact [facility] at [phone] with questions or concerns regarding your invoice.   IF you received labwork today, you will receive an invoice from [facility]. Please contact [facility] at [phone] with questions or concerns regarding your invoice.   Our billing staff will not be able to assist you with questions regarding bills from these companies.  You will be contacted with the lab results as soon as they are available. The fastest way to get your results is to activate your My Chart account. Instructions are located on the last page of this paperwork. If you have not heard from [place] regarding the results in 2 weeks, please contact this office.

## 2016-10-19 NOTE — Progress Notes (Signed)
Dylan Washington is a 56 y.o. male who presents to Primary Care at [facility] today for headaches and elevated BP   Noted headaches since Saturday. It has been in the left occipital region and in the frontal region. States he's been taking Bayer aspirin q2hrs while at work which will make the pain go away, but as time has progressed the headache has worsened. Pain in the occipital region is throbbing. He also noted frontal "fogginess" but on further questioning he denies confusion, states by fogginess he means pressure in the forehead and the crown of his head. Intermittently has nausea, none now. No emesis. No fevers, chills. No photophobia or phonophobia.  No vision changes, no unilateral weakness/tingling/numbness. No ataxia. No dysarthria. He's never had headaches like this before. Has had a few episodes of sinuitis in the past but this doesn't feel like that.    Elevated BP: notes it has consistently been elevated. Does not check at home. Compliant with medications. Has a h/o OSA, using a CPAP but notes that he just had another sleep study to transition from the nose pillow to a full fase mask. No SOB, some DOE with going up stairs which baseline.    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  ROS as above.  Pertinently, no chest pain, palpitations, SOB, Fever, Chills, Abd pain, N/V/D.   PMH reviewed. Patient is a nonsmoker.   Past Medical History:  Diagnosis Date  . Allergy    allergy shots at [facility] Immunology  . Asthma   . Hypertension   . Sleep apnea    Past Surgical History:  Procedure Laterality Date  . ACHILLES TENDON REPAIR    . NASAL SINUS SURGERY      Medications reviewed. Current Outpatient Prescriptions  Medication Sig Dispense Refill  . amLODipine (NORVASC) 10 MG tablet Take 1 tablet (10 mg total) by mouth daily. 90 tablet 0  . fluticasone (FLONASE) 50 MCG/ACT nasal spray Place 2 sprays into both nostrils daily. 16 g 11  . losartan-hydrochlorothiazide (HYZAAR)  100-25 MG tablet Take 1 tablet by mouth daily. 90 tablet 3  . Multiple Vitamins-Minerals (ADEKS) chewable tablet Chew 1 tablet by mouth daily.    Marland Kitchen triamcinolone (NASACORT AQ) 55 MCG/ACT AERO nasal inhaler Place 2 sprays into the nose daily. 1 Inhaler 12  . diclofenac (VOLTAREN) 75 MG EC tablet Take 1 tablet (75 mg total) by mouth 2 (two) times daily. (Patient not taking: Reported on 10/19/2016) 30 tablet 0   No current facility-administered medications for this visit.      Physical Exam:  BP (!) 140/100 (BP Location: Left Arm, Cuff Size: Large)   Pulse 94   Temp 99.2 F (37.3 C) (Oral)   Resp 18   Ht  (1.778 m)   Wt 256 lb 3.2 oz (116.2 kg)   SpO2 97%   BMI 36.76 kg/m  Gen:  Alert, cooperative patient who appears stated age in no acute distress.  Vital signs reviewed. HEENT: EOMI,  MMM. TMs clear, non-erythematous, non-bulging.  Pulm:  Clear to auscultation bilaterally with good air movement.  No wheezes or rales noted.   Cardiac:  Regular rate and rhythm without murmur auscultated.  Good S1/S2.  Exts: Non edematous BL  LE, warm and well perfused.  A&O x4. Speech clear. EOMI, Uvula and tongue midline. Facial movements symmetric. 5/5 strength in the upper extremities and lower extremities bilaterally. Sensation intact bilaterally. Symmetric DTRs. Normal gait.    Assessment and Plan:  56 y/o  with a PMHx of uncontrolled HTN presenting with headaches.   1. Headache: most likely analgesic rebound due to consistent use since Saturday. Could be migraine given unilateral position, throbbing nature, and intermittent nausea although would be odd to start having migraines now. Patient well appearing on exam. No neurologic deficits. No fevers or nuchal rigidity. Headache did not come on suddenly, does not sound like a thunderclap headache. Could also be from uncontrolled BP.  Phenergan 12.5mg  and toradol  IM given in clinic Discussed stopping analgesics. Strict return precautions  discussed Pt to f/u tomorrow if no improvement  2. HTN, uncontrolled: complaint with current regiment. BP improved on recheck but still elevated. Continue lisinopril-HCTZ, increase amlodipine to . Lab work today. Pt to f/ with PCP in 1 month  Joanna Puff, MD [facility] Resident  10/19/2016, 3:53 PM

## 2016-10-20 ENCOUNTER — Ambulatory Visit (INDEPENDENT_AMBULATORY_CARE_PROVIDER_SITE_OTHER): Payer: Federal, State, Local not specified - PPO | Admitting: Family Medicine

## 2016-10-20 ENCOUNTER — Telehealth: Payer: Self-pay | Admitting: Family Medicine

## 2016-10-20 VITALS — BP 155/98 | HR 89 | Temp 98.8°F | Resp 17 | Ht 70.0 in | Wt 257.0 lb

## 2016-10-20 DIAGNOSIS — R519 Headache, unspecified: Secondary | ICD-10-CM

## 2016-10-20 DIAGNOSIS — R51 Headache: Secondary | ICD-10-CM

## 2016-10-20 LAB — BASIC METABOLIC PANEL
BUN/Creatinine Ratio: 7 — ABNORMAL LOW (ref 9–20)
BUN: 6 mg/dL (ref 6–24)
CALCIUM: 9.4 mg/dL (ref 8.7–10.2)
CHLORIDE: 100 mmol/L (ref 96–106)
CO2: 25 mmol/L (ref 18–29)
Creatinine, Ser: 0.91 mg/dL (ref 0.76–1.27)
GFR calc non Af Amer: 96 mL/min/{1.73_m2} (ref >59)
GFR, EST AFRICAN AMERICAN: 111 mL/min/{1.73_m2} (ref >59)
Glucose: 92 mg/dL (ref 65–99)
POTASSIUM: 3.9 mmol/L (ref 3.5–5.2)
SODIUM: 140 mmol/L (ref 134–144)

## 2016-10-20 MED ORDER — KETOROLAC TROMETHAMINE 30 MG/ML IJ SOLN
30.0000 mg | Freq: Once | INTRAMUSCULAR | Status: AC
  Administered 2016-10-20: 30 mg via INTRAMUSCULAR

## 2016-10-20 NOTE — Telephone Encounter (Signed)
Pt stop by and stated that he cancel the appt for his MRI. And that the issues he was having was due to the side effects of the shots he takes every 3 months. He also states that he will follow up with his neurologist and wanted to thank you.

## 2016-10-20 NOTE — Progress Notes (Signed)
Dylan Washington is a 56 y.o. male who presents to Primary Care at [facility] today for headache:  1.  Headache:  Patient came yesterday for headache and was evaluated for same.  Headache started this past weekend.  Describes as occipital HA worse on the Left.  Began having frontal HA on Monday of this week.  He was taking Bayer ASA every 2 hours at first, and then decreased this but continued to take ASA.  This helped with his pain.    Yesterday, diagnosed with rebound headache from medication overuse.  He was given Toradol and Phenergan IM here -- these helped.  No further occipital HA, and frontal HA improved.    However this AM, frontal HA has recurred.  States "as bad today as has been."  No more occipital headache.  Doesn't regularly get headaches.  States when he's had the few headaches he's had in the past, they are "not like this."  This is much worse than previously.  No fevers or chills.  No recent injuries.  No falls.  No recent illnesses.  No URI symptoms.    ROS as above.    PMH reviewed. Patient is a nonsmoker.   Past Medical History:  Diagnosis Date  . Allergy    allergy shots at [facility]  . Asthma   . Hypertension   . Sleep apnea    Past Surgical History:  Procedure Laterality Date  . ACHILLES TENDON REPAIR    . NASAL SINUS SURGERY      Medications reviewed. Current Outpatient Prescriptions  Medication Sig Dispense Refill  . amLODipine (NORVASC) 10 MG tablet Take 1 tablet (10 mg total) by mouth daily. 90 tablet 0  . fluticasone (FLONASE) 50 MCG/ACT nasal spray Place 2 sprays into both nostrils daily. 16 g 11  . losartan-hydrochlorothiazide (HYZAAR) 100-25 MG tablet Take 1 tablet by mouth daily. 90 tablet 3  . Multiple Vitamins-Minerals (ADEKS) chewable tablet Chew 1 tablet by mouth daily.     No current facility-administered medications for this visit.      Physical Exam:  BP (!) 155/98 (BP Location: Right Arm, Patient Position: Sitting, Cuff Size:  Large)   Pulse 89   Temp 98.8 F (37.1 C) (Oral)   Resp 17   Ht  (1.778 m)   Wt 257 lb (116.6 kg)   SpO2 99%   BMI 36.88 kg/m  Gen:  Alert, cooperative patient who appears stated age in no acute distress.  Vital signs reviewed. Head: [place]/AT.   Eyes:  EOMI, PERRL.   Ears:  External ears WNL, Bilateral TM's normal without retraction, redness or bulging. Nose:  Septum midline  Mouth:  MMM, tonsils non-erythematous, non-edematous.   Neck:  Supple.  No LAD Pulm:  Clear to auscultation bilaterally with good air movement.  No wheezes or rales noted.  Cardiac:  Regular rate and rhythm without murmur auscultated.  Good S1/S2. Abd:  Soft/nondistended/nontender.  Good bowel sounds throughout all four quadrants.  No masses noted.  Exts: Non edematous BL  LE, warm and well perfused.  Neuro:  Alert and oriented to person, place, and date.  CN II-XII intact.  Sensation intact to light touch, pain, and vibration bilateral upper and lower extremities equally.  Motor function equal and strength 5/5 bilateral upper and lower extremities.  Normal gait.  Finger to nose cerebellar testing within normal limits.    Assessment and Plan:  1.  Headache: - fairly intractable at this point - Neuro exam today was  perfectly normal. I think he can wait until tomorrow for scan rather than urgent CT today.  - Patient given Toradol again and scheduled for STAT MRI.  Concern is that he is 56 yo M without consistent headaches who now has a "different and worse" headache than he's had previously   - MRI scheduled for tomorrow AM.  If negative, plan referral for neurology for further input.

## 2016-10-20 NOTE — Patient Instructions (Addendum)
Please arrive at 6:45 a.m. Dylan Washington [address] [place] for MRI on 10/21/2016.    IF you received an x-ray today, you will receive an invoice from [facility]. Please contact [facility] at [phone] with questions or concerns regarding your invoice.   IF you received labwork today, you will receive an invoice from [facility]. Please contact [facility] at [phone] with questions or concerns regarding your invoice.   Our billing staff will not be able to assist you with questions regarding bills from these companies.  You will be contacted with the lab results as soon as they are available. The fastest way to get your results is to activate your My Chart account. Instructions are located on the last page of this paperwork. If you have not heard from [place] regarding the results in 2 weeks, please contact this office.

## 2016-10-21 ENCOUNTER — Ambulatory Visit (HOSPITAL_COMMUNITY): Admission: RE | Admit: 2016-10-21 | Payer: Federal, State, Local not specified - PPO | Source: Ambulatory Visit

## 2016-10-21 DIAGNOSIS — R6883 Chills (without fever): Secondary | ICD-10-CM | POA: Diagnosis not present

## 2016-10-21 DIAGNOSIS — F17210 Nicotine dependence, cigarettes, uncomplicated: Secondary | ICD-10-CM | POA: Diagnosis not present

## 2016-10-21 DIAGNOSIS — G479 Sleep disorder, unspecified: Secondary | ICD-10-CM | POA: Diagnosis not present

## 2016-10-21 DIAGNOSIS — J324 Chronic pansinusitis: Secondary | ICD-10-CM | POA: Diagnosis not present

## 2016-10-21 DIAGNOSIS — R200 Anesthesia of skin: Secondary | ICD-10-CM | POA: Diagnosis not present

## 2016-10-21 DIAGNOSIS — I10 Essential (primary) hypertension: Secondary | ICD-10-CM | POA: Diagnosis not present

## 2016-10-21 DIAGNOSIS — R51 Headache: Secondary | ICD-10-CM | POA: Diagnosis not present

## 2016-10-21 NOTE — Telephone Encounter (Signed)
Thank you for letting me know.  I'm not sure what shots he's referring to, it sounds like corticosteroid injections.  If his headache has not improved, he should return or go to the ED for imaging.  JW

## 2016-10-21 NOTE — Procedures (Signed)
PATIENT'S NAME:  Dylan Washington, Dylan Washington DOB:      10/14/2014      MR#:    [id number]     DATE OF RECORDING: 10/14/2016 REFERRING M.D.: Dr. Natale Milch, PCP: Collie Siad, MD Study Performed:  Split-Night Titration Study HISTORY: 56 year old man with a history of allergies, asthma, hypertension, obesity, right bundle branch block, and low back pain, who presents for re-evaluation of his OSA and optimization of therapy. The patient endorsed the Epworth Sleepiness Scale at 7/24 points. The patient's weight 270 pounds with a height of 70 (inches), resulting in a BMI of 38.5 kg/m2. The patient's neck circumference measured 19 inches.  CURRENT MEDICATIONS: Norvasc, Voltaren, Flonase, Hyzaar, Adeks Multi Vitamins, Nasacort,   PROCEDURE:  This is a multichannel digital polysomnogram utilizing the Somnostar 11.2 system.  Electrodes and sensors were applied and monitored per AASM Specifications.   EEG, EOG, Chin and Limb EMG, were sampled at 200 Hz.  ECG, Snore and Nasal Pressure, Thermal Airflow, Respiratory Effort, CPAP Flow and Pressure, Oximetry was sampled at 50 Hz. Digital video and audio were recorded.      BASELINE STUDY WITHOUT CPAP RESULTS:  Lights Out was at 21:08 and Lights On at 05:09 for the night. Total recording time (TRT) was 250, with a total sleep time (TST) of 109 minutes. The patient's sleep latency was 90 minutes, with is prolonged. REM was absent. The sleep efficiency was 43.6%.    SLEEP ARCHITECTURE: WASO (Wake after sleep onset) was 89.5 minutes with moderate to severe sleep fragmentation noted, Stage N1 was 8.5 minutes, Stage N2 was 100.5 minutes, Stage N3 was 0 minutes and Stage R (REM sleep) was 0 minutes. The percentages were Stage N1 7.8%, Stage N2 92.2%, which is highly increased, and Stage N3 and Stage R (REM sleep) were absent.   The arousals were noted as: 3 were spontaneous, 0 were associated with PLMs, 22 were associated with respiratory events.   Audio and video analysis did not  show any abnormal or unusual movements, behaviors, phonations or vocalizations. The patient took 2 bathroom breaks for the night. Moderate to loud snoring was noted The EKG was in keeping with normal sinus rhythm (NSR).   RESPIRATORY ANALYSIS:  There were a total of 96 respiratory events:  36 obstructive apneas, 2 central apneas and 0 mixed apneas with a total of 38 apneas and an apnea index (AI) of 20.9. There were 58 hypopneas with a hypopnea index of 31.9. The patient also had 6 respiratory event related arousals (RERAs).  Snoring was noted.     The total APNEA/HYPOPNEA INDEX (AHI) was 52.8 /hour and the total RESPIRATORY DISTURBANCE INDEX was 56.1 /hour.  0 events occurred in REM sleep and 118 events in NREM. The REM AHI was n/a, /hour versus a non-REM AHI of 52.8 /hour. The patient spent 77.5 minutes sleep time in the supine position 213 minutes in non-supine. The supine AHI was 17.6 /hour versus a non-supine AHI of 59.3 /hour.  OXYGEN SATURATION & C02:  The wake baseline 02 saturation was 98%, with the lowest being 76%. Time spent below 89% saturation equaled 26 minutes.  PERIODIC LIMB MOVEMENTS: The patient had a total of 37 Periodic Limb Movements.  The Periodic Limb Movement (PLM) index was 20.4 /hour and the PLM Arousal index was 0 /hour.  TITRATION STUDY WITH CPAP RESULTS:   The patient was fitted with a large FFM. CPAP was initiated at 5 cmH20 with heated humidity per AASM split night standards and pressure was advanced  to 15 cmH20 because of hypopneas, apneas, snoring and desaturations. At a PAP pressure of 15 cmH20, there was a reduction of the AHI to 2.6 /hour, O2 nadir of 90%, non-supine REM sleep achieved.   Total recording time (TRT) was 232 minutes, with a total sleep time (TST) of 181.5 minutes. The patient's sleep latency was 26.5 minutes. REM latency was 12.5 minutes.  The sleep efficiency was 78.2 %.    SLEEP ARCHITECTURE: Wake after sleep was 35.5 minutes, Stage N1 8.5  minutes, Stage N2 113.5 minutes, Stage N3 0 minutes and Stage R (REM sleep) 59.5 minutes. The percentages were: Stage N1 4.7%, Stage N2 62.5%, Stage N3 was absent, and Stage R (REM sleep) 32.8%, which is increased (in keeping with REM rebound).   The arousals were noted as: 19 were spontaneous, 0 were associated with PLMs, 6 were associated with respiratory events.  RESPIRATORY ANALYSIS:  There were a total of 38 respiratory events: 1 obstructive apneas, 0 central apneas and 0 mixed apneas with a total of 1 apneas and an apnea index (AI) of .3. There were 37 hypopneas with a hypopnea index of 12.2 /hour. The patient also had 1 respiratory event related arousals (RERAs).      The total APNEA/HYPOPNEA INDEX  (AHI) was 12.6 /hour and the total RESPIRATORY DISTURBANCE INDEX was 12.9 /hour.  5 events occurred in REM sleep and 33 events in NREM. The REM AHI was 5. /hour versus a non-REM AHI of 16.2 /hour. REM sleep was achieved on a pressure of  cm/h2o (AHI was  .) The patient spent 33% of total sleep time in the supine position. The supine AHI was 8.9 /hour, versus a non-supine AHI of 14.4/hour.  OXYGEN SATURATION & C02:  The wake baseline 02 saturation was 99%, with the lowest being 78%. Time spent below 89% saturation equaled 4 minutes.  PERIODIC LIMB MOVEMENTS: The patient had a total of 65 Periodic Limb Movements. The Periodic Limb Movement (PLM) index was 21.5 /hour and the PLM Arousal index was 0 /hour.   Post-study, the patient indicated that sleep was better than usual.    POLYSOMNOGRAPHY IMPRESSION :   1. Obstructive Sleep Apnea (OSA)  2. Dysfunctions associated with sleep stages or arousals from sleep 3. Periodic Limb Movement Disorder (PLMD)  RECOMMENDATIONS:  1. This patient has severe obstructive sleep apnea and responded well on CPAP therapy. I will, therefore, start the patient on home CPAP treatment at a pressure of 15 cm via large FFM, with heated humidity. The patient should be  reminded to be fully compliant with PAP therapy to improve sleep related symptoms and decrease long term cardiovascular risks. Please note that untreated obstructive sleep apnea carries additional perioperative morbidity. Patients with significant obstructive sleep apnea should receive perioperative PAP therapy and the surgeons and particularly the anesthesiologist should be informed of the diagnosis and the severity of the sleep disordered breathing. 2. This study shows sleep fragmentation and abnormal sleep stage percentages; these are nonspecific findings and per se do not signify an intrinsic sleep disorder or a cause for the patient's sleep-related symptoms. Causes include (but are not limited to) the first night effect of the sleep study, circadian rhythm disturbances, medication effect or an underlying mood disorder or medical problem.  3. The patient should be cautioned not to drive, work at heights, or operate dangerous or heavy equipment when tired or sleepy. Review and reiteration of good sleep hygiene measures should be pursued with any patient. 4. Mild PLMs (periodic  limb movements of sleep) were noted during the baseline and treatment portions of the study without significant arousals; clinical correlation is recommended.  5. The patient will be seen in follow-up by Dr. Frances Furbish at [facility] for discussion of the test results and further management strategies. The referring provider will be notified of the test results.  I certify that I have reviewed the entire raw data recording prior to the issuance of this report in accordance with the Standards of Accreditation of the American Academy of Sleep Medicine (AASM)     Letizia Hook,MD,PhD Diplomat, American Board of Psychiatry and Neurology (Neurology and Sleep Medicine)

## 2016-10-21 NOTE — Addendum Note (Signed)
Addended by: Huston Foley on: 10/21/2016 01:53 PM   Modules accepted: Orders

## 2016-10-21 NOTE — Progress Notes (Signed)
Diana:  Patient referred by Dr. Alvy Bimler, seen by me on 09/13/16, split study on 10/14/16 for re-evaluation of his OSA and treatment adjustment.  Please call and notify patient that the recent sleep study confirmed the diagnosis of severe OSA. He did well with CPAP during the study with significant improvement of the respiratory events. Therefore, I would like start the patient on CPAP therapy at home by prescribing a machine for home use. He may have existing DME. I placed the order in the chart. The patient will need a follow up appointment with me in 10 weeks post set up that has to be scheduled; please go ahead and schedule while you have the patient on the phone and make sure patient understands the importance of keeping this window for the FU appointment, as it is often an insurance requirement and failing to adhere to this may result in losing coverage for sleep apnea treatment.  Please re-enforce the importance of compliance with treatment and the need for [place] to monitor compliance data - again an insurance requirement and good feedback for the patient as far as how they are doing.  Also remind patient, that any upcoming CPAP machine or mask issues, should be first addressed with the DME company. Please ask if patient has a preference regarding DME company.  Please arrange for CPAP set up at home through a DME company of patient's choice - once you have spoken to the patient - and faxed/routed report to PCP and referring MD (if other than PCP), you can close this encounter, thanks,   Huston Foley, MD, PhD [facility] ([facility])

## 2016-10-22 ENCOUNTER — Ambulatory Visit (HOSPITAL_COMMUNITY)
Admission: EM | Admit: 2016-10-22 | Discharge: 2016-10-22 | Disposition: A | Discharge status: Home / Self Care | Payer: Federal, State, Local not specified - PPO | Attending: Family Medicine | Admitting: Family Medicine

## 2016-10-22 ENCOUNTER — Encounter (HOSPITAL_COMMUNITY): Payer: Self-pay | Admitting: Family Medicine

## 2016-10-22 DIAGNOSIS — J0101 Acute recurrent maxillary sinusitis: Secondary | ICD-10-CM

## 2016-10-22 DIAGNOSIS — J320 Chronic maxillary sinusitis: Secondary | ICD-10-CM | POA: Diagnosis not present

## 2016-10-22 MED ORDER — FLUTICASONE PROPIONATE 50 MCG/ACT NA SUSP: 2.0000 | Freq: Every day | NASAL | 11 refills | Status: AC

## 2016-10-22 MED ORDER — AMOXICILLIN-POT CLAVULANATE 875-125 MG PO TABS: 1.0000 | ORAL_TABLET | Freq: Two times a day (BID) | ORAL | 0 refills | Status: DC

## 2016-10-22 NOTE — ED Provider Notes (Signed)
[facility]    CSN: [id number] Arrival date & time: 10/22/16  1312     History   Chief Complaint Chief Complaint  Patient presents with  . Facial Pain    HPI Dylan Washington is a 56 y.o. male.   This a 56 year old man who presents with symptoms of sinus congestion. He's had frontal sinus pressure for 2 weeks. He's been evaluated [facility] recently with a CAT scan which showed the congestion but was otherwise normal. They prescribed Fioricet 4.      Past Medical History:  Diagnosis Date  . Allergy    allergy shots at [facility]  . Asthma   . Hypertension   . Sleep apnea     Patient Active Problem List   Diagnosis Date Noted  . Bone spur of foot 02/08/2016  . RBBB (right bundle branch block) 04/02/2014  . Asthma 01/17/2012  . Environmental allergies 08/16/2011  . Hypertension   . Sleep apnea     Past Surgical History:  Procedure Laterality Date  . ACHILLES TENDON REPAIR    . NASAL SINUS SURGERY         Home Medications    Prior to Admission medications   Medication Sig Start Date End Date Taking? Authorizing Provider  amLODipine (NORVASC) 10 MG tablet Take 1 tablet (10 mg total) by mouth daily. 10/19/16   Joanna Puff, MD  amoxicillin-clavulanate (AUGMENTIN) 875-125 MG tablet Take 1 tablet by mouth every 12 (twelve) hours. 10/22/16   Elvina Sidle, MD  fluticasone (FLONASE) 50 MCG/ACT nasal spray Place 2 sprays into both nostrils daily. 10/22/16   Elvina Sidle, MD  losartan-hydrochlorothiazide (HYZAAR) 100-25 MG tablet Take 1 tablet by mouth daily. 07/20/16   Georgina Quint, MD  Multiple Vitamins-Minerals (ADEKS) chewable tablet Chew 1 tablet by mouth daily.    Historical Provider, MD    Family History Family History  Problem Relation Age of Onset  . Hypertension Mother   . Cancer Mother   . Hypertension Father     Social History Social History  Substance Use Topics  . Smoking status: Light Tobacco Smoker    . Smokeless tobacco: Never Used     Comment: 3 cig a day   . Alcohol use No     Allergies   Patient has no known allergies.   Review of Systems Review of Systems  Constitutional: Negative.   HENT: Positive for congestion.   Respiratory: Negative.   Cardiovascular: Negative.   Musculoskeletal: Negative.   Neurological: Positive for headaches.     Physical Exam Triage Vital Signs ED Triage Vitals  Enc Vitals Group     BP      Pulse      Resp      Temp      Temp src      SpO2      Weight      Height      Head Circumference      Peak Flow      Pain Score      Pain Loc      Pain Edu?      Excl. in GC?    No data found.   Updated Vital Signs There were no vitals taken for this visit.   Physical Exam  Constitutional: He is oriented to person, place, and time. He appears well-developed and well-nourished.  HENT:  Right Ear: External ear normal.  Left Ear: External ear normal.  Mouth/Throat: Oropharynx is clear and  moist.  Moderate nasal passage swelling with mucopurulent exudates  Eyes: Conjunctivae are normal. Pupils are equal, round, and reactive to light.  Neck: Normal range of motion. Neck supple.  Pulmonary/Chest: Effort normal.  Musculoskeletal: Normal range of motion.  Neurological: He is alert and oriented to person, place, and time.  Skin: Skin is warm and dry.  Nursing note and vitals reviewed.    UC Treatments / Results  Labs (all labs ordered are listed, but only abnormal results are displayed) Labs Reviewed - No data to display  EKG  EKG Interpretation None       Radiology Ct Head Wo Contrast  Result Date: 10/21/2016 CLINICAL DATA: Headache for 1 month. No reported injury. EXAM: CT HEAD WITHOUT CONTRAST TECHNIQUE: Contiguous axial images were obtained from the base of the skull through the vertex without intravenous contrast. COMPARISON: None. FINDINGS: Brain: No evidence of parenchymal hemorrhage or extra-axial fluid collection.  No mass lesion, mass effect, or midline shift. No CT evidence of acute infarction. Cerebral volume is age appropriate. No ventriculomegaly. Vascular: No hyperdense vessel or unexpected calcification. Skull: No evidence of calvarial fracture. Sinuses/Orbits: Minimal partial opacification of the right ethmoidal air cells. Small mucous retention cyst versus polyp in the anterior right maxillary sinus. No fluid levels. Other: The mastoid air cells are unopacified.   1. No evidence of acute intracranial abnormality. 2. Minimal chronic paranasal sinusitis. Electronically Signed By: Delbert Phenix M.D. On: 10/21/2016 17:46   Procedures Procedures (including critical care time)  Medications Ordered in UC Medications - No data to display   Initial Impression / Assessment and Plan / UC Course  I have reviewed the triage vital signs and the nursing notes.  Pertinent labs & imaging results that were available during my care of the patient were reviewed by me and considered in my medical decision making (see chart for details).      Final Clinical Impressions(s) / UC Diagnoses   Final diagnoses:  Maxillary sinusitis, chronic    New Prescriptions New Prescriptions   AMOXICILLIN-CLAVULANATE (AUGMENTIN) 875-125 MG TABLET    Take 1 tablet by mouth every 12 (twelve) hours.     Elvina Sidle, MD 10/22/16 [phone]

## 2016-10-22 NOTE — Discharge Instructions (Signed)
Ct Head Wo Contrast  Result Date: 10/21/2016 CLINICAL DATA: Headache for 1 month. No reported injury. EXAM: CT HEAD WITHOUT CONTRAST TECHNIQUE: Contiguous axial images were obtained from the base of the skull through the vertex without intravenous contrast. COMPARISON: None. FINDINGS: Brain: No evidence of parenchymal hemorrhage or extra-axial fluid collection. No mass lesion, mass effect, or midline shift. No CT evidence of acute infarction. Cerebral volume is age appropriate. No ventriculomegaly. Vascular: No hyperdense vessel or unexpected calcification. Skull: No evidence of calvarial fracture. Sinuses/Orbits: Minimal partial opacification of the right ethmoidal air cells. Small mucous retention cyst versus polyp in the anterior right maxillary sinus. No fluid levels. Other: The mastoid air cells are unopacified.   1. No evidence of acute intracranial abnormality. 2. Minimal chronic paranasal sinusitis. Electronically Signed By: Delbert Phenix M.D. On: 10/21/2016 17:46

## 2016-10-22 NOTE — ED Triage Notes (Signed)
Pt having sinus pressure and pain

## 2016-10-25 DIAGNOSIS — M5412 Radiculopathy, cervical region: Secondary | ICD-10-CM | POA: Diagnosis not present

## 2016-10-25 DIAGNOSIS — R51 Headache: Secondary | ICD-10-CM | POA: Diagnosis not present

## 2016-10-25 DIAGNOSIS — M5417 Radiculopathy, lumbosacral region: Secondary | ICD-10-CM | POA: Diagnosis not present

## 2016-10-25 DIAGNOSIS — G5602 Carpal tunnel syndrome, left upper limb: Secondary | ICD-10-CM | POA: Diagnosis not present

## 2016-10-26 ENCOUNTER — Telehealth: Payer: Self-pay

## 2016-10-26 NOTE — Telephone Encounter (Signed)
I called pt. I advised pt that Dr. Frances Furbish reviewed their sleep study results and found that pt has severe osa but did well with the cpap. Dr. Frances Furbish  recommends that pt start a cpap at home. I reviewed PAP compliance expectations with the pt. Pt is agreeable to starting a CPAP. I advised pt that an order will be sent to a DME, [facility], and [facility] will call the pt within about one week after they file with the pt's insurance. [facility] will show the pt how to use the machine, fit for masks, and troubleshoot the CPAP if needed. A follow up appt was made for insurance purposes with Dr. Frances Furbish on 01/12/17 at 9:30am. Pt verbalized understanding to arrive 15 minutes early and bring their CPAP. A letter with all of this information in it will be mailed to the pt as a reminder. I verified with the pt that the address we have on file is correct. Pt verbalized understanding of results. Pt had no questions at this time but was encouraged to call back if questions arise.

## 2016-10-26 NOTE — Telephone Encounter (Signed)
-----   Message from Huston Foley, MD sent at 10/21/2016  1:53 PM EDT ----- Lafonda Mosses:  Patient referred by Dr. Alvy Bimler, seen by me on 09/13/16, split study on 10/14/16 for re-evaluation of his OSA and treatment adjustment.  Please call and notify patient that the recent sleep study confirmed the diagnosis of severe OSA. He did well with CPAP during the study with significant improvement of the respiratory events. Therefore, I would like start the patient on CPAP therapy at home by prescribing a machine for home use. He may have existing DME. I placed the order in the chart. The patient will need a follow up appointment with me in 10 weeks post set up that has to be scheduled; please go ahead and schedule while you have the patient on the phone and make sure patient understands the importance of keeping this window for the FU appointment, as it is often an insurance requirement and failing to adhere to this may result in losing coverage for sleep apnea treatment.  Please re-enforce the importance of compliance with treatment and the need for [place] to monitor compliance data - again an insurance requirement and good feedback for the patient as far as how they are doing.  Also remind patient, that any upcoming CPAP machine or mask issues, should be first addressed with the DME company. Please ask if patient has a preference regarding DME company.  Please arrange for CPAP set up at home through a DME company of patient's choice - once you have spoken to the patient - and faxed/routed report to PCP and referring MD (if other than PCP), you can close this encounter, thanks,   Huston Foley, MD, PhD [facility]

## 2016-11-01 DIAGNOSIS — G4733 Obstructive sleep apnea (adult) (pediatric): Secondary | ICD-10-CM | POA: Diagnosis not present

## 2016-11-14 ENCOUNTER — Ambulatory Visit (INDEPENDENT_AMBULATORY_CARE_PROVIDER_SITE_OTHER): Payer: Federal, State, Local not specified - PPO | Admitting: Emergency Medicine

## 2016-11-14 ENCOUNTER — Encounter: Payer: Self-pay | Admitting: Emergency Medicine

## 2016-11-14 VITALS — BP 166/91 | HR 73 | Temp 98.4°F | Resp 18 | Ht 70.0 in | Wt 266.8 lb

## 2016-11-14 DIAGNOSIS — G4730 Sleep apnea, unspecified: Secondary | ICD-10-CM | POA: Diagnosis not present

## 2016-11-14 DIAGNOSIS — G44229 Chronic tension-type headache, not intractable: Secondary | ICD-10-CM

## 2016-11-14 DIAGNOSIS — R21 Rash and other nonspecific skin eruption: Secondary | ICD-10-CM | POA: Diagnosis not present

## 2016-11-14 DIAGNOSIS — I10 Essential (primary) hypertension: Secondary | ICD-10-CM | POA: Diagnosis not present

## 2016-11-14 MED ORDER — CLOBETASOL PROPIONATE 0.05 % EX CREA: 1.0000 "application " | TOPICAL_CREAM | Freq: Two times a day (BID) | CUTANEOUS | 0 refills | Status: AC

## 2016-11-14 MED ORDER — HYDRALAZINE HCL 50 MG PO TABS: 50.0000 mg | ORAL_TABLET | Freq: Two times a day (BID) | ORAL | 3 refills | Status: DC

## 2016-11-14 NOTE — Assessment & Plan Note (Signed)
Still present but less intense and less frequent. Probably related to both OSA and HTN.

## 2016-11-14 NOTE — Progress Notes (Signed)
Dylan Washington 56 y.o.   Chief Complaint  Patient presents with  . Headache    HISTORY OF PRESENT ILLNESS: This is a 56 y.o. male complaining of persistent intermittent left occipital headaches for months; has h/o HTN and BP still not well controlled; recently seen by Neurologist who just gave him pills that make him sleepy; also recently diagnosed with sleep apnea and recently started on CPAP which he states it's helping the headaches.  HPI   Prior to Admission medications   Medication Sig Start Date End Date Taking? Authorizing Provider  amLODipine (NORVASC) 10 MG tablet Take 1 tablet (10 mg total) by mouth daily. 10/19/16  Yes Joanna Pufforsey, Crystal S, MD  amoxicillin-clavulanate (AUGMENTIN) 875-125 MG tablet Take 1 tablet by mouth every 12 (twelve) hours. 10/22/16  Yes Elvina SidleLauenstein, Kurt, MD  fluticasone (FLONASE) 50 MCG/ACT nasal spray Place 2 sprays into both nostrils daily. 10/22/16  Yes Elvina SidleLauenstein, Kurt, MD  losartan-hydrochlorothiazide (HYZAAR) 100-25 MG tablet Take 1 tablet by mouth daily. 07/20/16  Yes Lakena Sparlin, Eilleen KempfMiguel Jose, MD  Multiple Vitamins-Minerals (ADEKS) chewable tablet Chew 1 tablet by mouth daily.   Yes [provider]  clobetasol cream (TEMOVATE) 0.05 % Apply 1 application topically 2 (two) times daily. 11/14/16 11/17/16  Georgina QuintSagardia, Shaletta Hinostroza Jose, MD  hydrALAZINE (APRESOLINE) 50 MG tablet Take 1 tablet (50 mg total) by mouth 2 (two) times daily. 11/14/16 12/14/16  Georgina QuintSagardia, Ezechiel [place]bury Jose, MD    No Known Allergies  Patient Active Problem List   Diagnosis Date Noted  . Bone spur of foot 02/08/2016  . RBBB (right bundle branch block) 04/02/2014  . Asthma 01/17/2012  . Environmental allergies 08/16/2011  . Hypertension   . Sleep apnea     Past Medical History:  Diagnosis Date  . Allergy    allergy shots at [facility]  . Asthma   . Hypertension   . Sleep apnea     Past Surgical History:  Procedure Laterality Date  . ACHILLES TENDON REPAIR    . NASAL SINUS  SURGERY      Social History   Social History  . Marital status: Single    Spouse name: N/A  . Number of children: 0  . Years of education: college   Occupational History  .  [employer]   Social History Main Topics  . Smoking status: Light Tobacco Smoker  . Smokeless tobacco: Never Used     Comment: 3 cig a day   . Alcohol use No  . Drug use: No  . Sexual activity: Yes   Other Topics Concern  . Not on file   Social History Narrative   Marital status: married;       Children: none      Lives: with wife.      Employment: works at [employer]; Solicitorclerk.      Tobacco: none      Alcohol: none      Drugs: none      Exercise: gym 4-5 days per week.   Denies  Caffeine use     Family History  Problem Relation Age of Onset  . Hypertension Mother   . Cancer Mother   . Hypertension Father      Review of Systems  Constitutional: Positive for malaise/fatigue. Negative for chills and fever.  HENT: Negative for congestion, hearing loss, nosebleeds and sore throat.   Eyes: Negative.  Negative for blurred vision, double vision, discharge and redness.  Respiratory: Negative.  Negative for cough, shortness of breath and  wheezing.   Cardiovascular: Negative.  Negative for chest pain, palpitations and leg swelling.  Gastrointestinal: Negative for abdominal pain, constipation, diarrhea, nausea and vomiting.  Genitourinary: Negative for dysuria and hematuria.  Musculoskeletal: Negative for back pain, joint pain, myalgias and neck pain.  Skin: Positive for rash (facial).  Neurological: Positive for weakness and headaches. Negative for dizziness, sensory change, focal weakness and seizures.  Endo/Heme/Allergies: Negative.   Psychiatric/Behavioral: Negative.   All other systems reviewed and are negative.  Vitals:   11/14/16 1358 11/14/16 1434  BP: (!) 152/92 (!) 166/91  Pulse: 73   Resp: 18   Temp: 98.4 F (36.9 C)      Physical Exam  Constitutional: He is oriented to  person, place, and time. He appears well-developed and well-nourished.  HENT:  Head: Normocephalic and atraumatic.  Right Ear: Tympanic membrane, external ear and ear canal normal.  Left Ear: Tympanic membrane, external ear and ear canal normal.  Nose: Nose normal.  Mouth/Throat: Oropharynx is clear and moist. No oropharyngeal exudate.  Eyes: Conjunctivae and EOM are normal. Pupils are equal, round, and reactive to light.  Neck: Normal range of motion. Neck supple. No JVD present. No thyromegaly present.  Cardiovascular: Normal rate, regular rhythm and normal heart sounds.   Pulmonary/Chest: Effort normal and breath sounds normal. He has no rales.  Abdominal: Soft. Bowel sounds are normal. He exhibits no distension. There is no tenderness.  Musculoskeletal: Normal range of motion.  Lymphadenopathy:    He has no cervical adenopathy.  Neurological: He is alert and oriented to person, place, and time. No sensory deficit. He exhibits normal muscle tone.  Skin: Skin is warm and dry. Capillary refill takes less than 2 seconds. Rash (mild dermatitis left lower facial area) noted.  Psychiatric: He has a normal mood and affect. His behavior is normal.  Vitals reviewed.    ASSESSMENT & PLAN: [name] was seen today for headache.  Diagnoses and all orders for this visit:  Essential hypertension  Chronic tension-type headache, not intractable  Facial rash -     clobetasol cream (TEMOVATE) 0.05 %; Apply 1 application topically 2 (two) times daily.  Sleep apnea, unspecified type  Other orders -     hydrALAZINE (APRESOLINE) 50 MG tablet; Take 1 tablet (50 mg total) by mouth 2 (two) times daily.   Current Assessment & Plan Note Edited: Georgina Quint, MD Today: HTN Still not well controlled. Will add Apresoline 50mg  bid.        Current Assessment & Plan Note Edited: Georgina Quint, MD Today: OSA On CPAP; helping symptoms, in particular headaches.         Current  Assessment & Plan Note Edited: Georgina Quint, MD Today: chronic headaches Still present but less intense and less frequent. Probably related to both OSA and HTN.          Patient Instructions       IF you received an x-ray today, you will receive an invoice from [facility]. Please contact [facility] at [phone] with questions or concerns regarding your invoice.   IF you received labwork today, you will receive an invoice from [facility]. Please contact [facility] at [phone] with questions or concerns regarding your invoice.   Our billing staff will not be able to assist you with questions regarding bills from these companies.  You will be contacted with the lab results as soon as they are available. The fastest way to get your results is to activate your My Chart  account. Instructions are located on the last page of this paperwork. If you have not heard from [place] regarding the results in 2 weeks, please contact this office.      Hypertension Hypertension is another name for high blood pressure. High blood pressure forces your heart to work harder to pump blood. This can cause problems over time. There are two numbers in a blood pressure reading. There is a top number (systolic) over a bottom number (diastolic). It is best to have a blood pressure below 120/80. Healthy choices can help lower your blood pressure. You may need medicine to help lower your blood pressure if:  Your blood pressure cannot be lowered with healthy choices.  Your blood pressure is higher than 130/80. Follow these instructions at home: Eating and drinking   If directed, follow the DASH eating plan. This diet includes:  Filling half of your plate at each meal with fruits and vegetables.  Filling one quarter of your plate at each meal with whole grains. Whole grains include whole wheat pasta, brown rice, and whole grain bread.  Eating or drinking low-fat dairy products,  such as skim milk or low-fat yogurt.  Filling one quarter of your plate at each meal with low-fat (lean) proteins. Low-fat proteins include fish, skinless chicken, eggs, beans, and tofu.  Avoiding fatty meat, cured and processed meat, or chicken with skin.  Avoiding premade or processed food.  Eat less than 1,500 mg of salt (sodium) a day.  Limit alcohol use to no more than 1 drink a day for nonpregnant women and 2 drinks a day for men. One drink equals 12 oz of beer, 5 oz of wine, or 1 oz of hard liquor. Lifestyle   Work with your doctor to stay at a healthy weight or to lose weight. Ask your doctor what the best weight is for you.  Get at least 30 minutes of exercise that causes your heart to beat faster (aerobic exercise) most days of the week. This may include walking, swimming, or biking.  Get at least 30 minutes of exercise that strengthens your muscles (resistance exercise) at least 3 days a week. This may include lifting weights or pilates.  Do not use any products that contain nicotine or tobacco. This includes cigarettes and e-cigarettes. If you need help quitting, ask your doctor.  Check your blood pressure at home as told by your doctor.  Keep all follow-up visits as told by your doctor. This is important. Medicines   Take over-the-counter and prescription medicines only as told by your doctor. Follow directions carefully.  Do not skip doses of blood pressure medicine. The medicine does not work as well if you skip doses. Skipping doses also puts you at risk for problems.  Ask your doctor about side effects or reactions to medicines that you should watch for. Contact a doctor if:  You think you are having a reaction to the medicine you are taking.  You have headaches that keep coming back (recurring).  You feel dizzy.  You have swelling in your ankles.  You have trouble with your vision. Get help right away if:  You get a very bad headache.  You start to  feel confused.  You feel weak or numb.  You feel faint.  You get very bad pain in your:  Chest.  Belly (abdomen).  You throw up (vomit) more than once.  You have trouble breathing. Summary  Hypertension is another name for high blood pressure.  Making healthy choices  can help lower blood pressure. If your blood pressure cannot be controlled with healthy choices, you may need to take medicine. This information is not intended to replace advice given to you by your health care provider. Make sure you discuss any questions you have with your health care provider. Document Released: 12/14/2007 Document Revised: 05/25/2016 Document Reviewed: 05/25/2016 Elsevier Interactive Patient Education  2017 Elsevier Inc.      Edwina Barth, MD [facility]

## 2016-11-14 NOTE — Assessment & Plan Note (Signed)
Still not well controlled. Will add Apresoline 50mg  bid.

## 2016-11-14 NOTE — Patient Instructions (Addendum)
   IF you received an x-ray today, you will receive an invoice from [facility]. Please contact [facility] at [phone] with questions or concerns regarding your invoice.   IF you received labwork today, you will receive an invoice from [facility]. Please contact [facility] at [phone] with questions or concerns regarding your invoice.   Our billing staff will not be able to assist you with questions regarding bills from these companies.  You will be contacted with the lab results as soon as they are available. The fastest way to get your results is to activate your My Chart account. Instructions are located on the last page of this paperwork. If you have not heard from us regarding the results in 2 weeks, please contact this office.      Hypertension Hypertension is another name for high blood pressure. High blood pressure forces your heart to work harder to pump blood. This can cause problems over time. There are two numbers in a blood pressure reading. There is a top number (systolic) over a bottom number (diastolic). It is best to have a blood pressure below 120/80. Healthy choices can help lower your blood pressure. You may need medicine to help lower your blood pressure if:  Your blood pressure cannot be lowered with healthy choices.  Your blood pressure is higher than 130/80. Follow these instructions at home: Eating and drinking   If directed, follow the DASH eating plan. This diet includes:  Filling half of your plate at each meal with fruits and vegetables.  Filling one quarter of your plate at each meal with whole grains. Whole grains include whole wheat pasta, brown rice, and whole grain bread.  Eating or drinking low-fat dairy products, such as skim milk or low-fat yogurt.  Filling one quarter of your plate at each meal with low-fat (lean) proteins. Low-fat proteins include fish, skinless chicken, eggs, beans, and tofu.  Avoiding fatty meat,  cured and processed meat, or chicken with skin.  Avoiding premade or processed food.  Eat less than 1,500 mg of salt (sodium) a day.  Limit alcohol use to no more than 1 drink a day for nonpregnant women and 2 drinks a day for men. One drink equals 12 oz of beer, 5 oz of wine, or 1 oz of hard liquor. Lifestyle   Work with your doctor to stay at a healthy weight or to lose weight. Ask your doctor what the best weight is for you.  Get at least 30 minutes of exercise that causes your heart to beat faster (aerobic exercise) most days of the week. This may include walking, swimming, or biking.  Get at least 30 minutes of exercise that strengthens your muscles (resistance exercise) at least 3 days a week. This may include lifting weights or pilates.  Do not use any products that contain nicotine or tobacco. This includes cigarettes and e-cigarettes. If you need help quitting, ask your doctor.  Check your blood pressure at home as told by your doctor.  Keep all follow-up visits as told by your doctor. This is important. Medicines   Take over-the-counter and prescription medicines only as told by your doctor. Follow directions carefully.  Do not skip doses of blood pressure medicine. The medicine does not work as well if you skip doses. Skipping doses also puts you at risk for problems.  Ask your doctor about side effects or reactions to medicines that you should watch for. Contact a doctor if:  You think you are having a   reaction to the medicine you are taking.  You have headaches that keep coming back (recurring).  You feel dizzy.  You have swelling in your ankles.  You have trouble with your vision. Get help right away if:  You get a very bad headache.  You start to feel confused.  You feel weak or numb.  You feel faint.  You get very bad pain in your:  Chest.  Belly (abdomen).  You throw up (vomit) more than once.  You have trouble  breathing. Summary  Hypertension is another name for high blood pressure.  Making healthy choices can help lower blood pressure. If your blood pressure cannot be controlled with healthy choices, you may need to take medicine. This information is not intended to replace advice given to you by your health care provider. Make sure you discuss any questions you have with your health care provider. Document Released: 12/14/2007 Document Revised: 05/25/2016 Document Reviewed: 05/25/2016 Elsevier Interactive Patient Education  2017 Elsevier Inc.  

## 2016-11-14 NOTE — Assessment & Plan Note (Signed)
On CPAP; helping symptoms, in particular headaches.

## 2016-11-22 ENCOUNTER — Encounter: Payer: Self-pay | Admitting: Family Medicine

## 2016-11-22 ENCOUNTER — Ambulatory Visit (INDEPENDENT_AMBULATORY_CARE_PROVIDER_SITE_OTHER): Payer: Federal, State, Local not specified - PPO | Admitting: Family Medicine

## 2016-11-22 VITALS — BP 142/88 | HR 96 | Temp 97.4°F | Resp 18 | Ht 70.0 in | Wt 264.0 lb

## 2016-11-22 DIAGNOSIS — N5201 Erectile dysfunction due to arterial insufficiency: Secondary | ICD-10-CM

## 2016-11-22 DIAGNOSIS — I10 Essential (primary) hypertension: Secondary | ICD-10-CM

## 2016-11-22 DIAGNOSIS — G44229 Chronic tension-type headache, not intractable: Secondary | ICD-10-CM | POA: Diagnosis not present

## 2016-11-22 MED ORDER — TADALAFIL 5 MG PO TABS: 5.0000 mg | ORAL_TABLET | Freq: Every day | ORAL | 0 refills | Status: AC | PRN

## 2016-11-22 NOTE — Patient Instructions (Addendum)
   IF you received an x-ray today, you will receive an invoice from [facility]. Please contact [facility] at [phone] with questions or concerns regarding your invoice.   IF you received labwork today, you will receive an invoice from [facility]. Please contact [facility] at [phone] with questions or concerns regarding your invoice.   Our billing staff will not be able to assist you with questions regarding bills from these companies.  You will be contacted with the lab results as soon as they are available. The fastest way to get your results is to activate your My Chart account. Instructions are located on the last page of this paperwork. If you have not heard from us regarding the results in 2 weeks, please contact this office.     Erectile Dysfunction Erectile dysfunction (ED) is the inability to get or keep an erection in order to have sexual intercourse. Erectile dysfunction may include:  Inability to get an erection.  Lack of enough hardness of the erection to allow penetration.  Loss of the erection before sex is finished.  What are the causes? This condition may be caused by:  Certain medicines, such as: ? Pain relievers. ? Antihistamines. ? Antidepressants. ? Blood pressure medicines. ? Water pills (diuretics). ? Ulcer medicines. ? Muscle relaxants. ? Drugs.  Excessive drinking.  Psychological causes, such as: ? Anxiety. ? Depression. ? Sadness. ? Exhaustion. ? Performance fear. ? Stress.  Physical causes, such as: ? Artery problems. This may include diabetes, smoking, liver disease, or atherosclerosis. ? High blood pressure. ? Hormonal problems, such as low testosterone. ? Obesity. ? Nerve problems. This may include back or pelvic injuries, diabetes mellitus, multiple sclerosis, or Parkinson disease.  What are the signs or symptoms? Symptoms of this condition include:  Inability to get an erection.  Lack of enough hardness  of the erection to allow penetration.  Loss of the erection before sex is finished.  Normal erections at some times, but with frequent unsatisfactory episodes.  Low sexual satisfaction in either partner due to erection problems.  A curved penis occurring with erection. The curve may cause pain or the penis may be too curved to allow for intercourse.  Never having nighttime erections.  How is this diagnosed? This condition is often diagnosed by:  Performing a physical exam to find other diseases or specific problems with the penis.  Asking you detailed questions about the problem.  Performing blood tests to check for diabetes mellitus or to measure hormone levels.  Performing other tests to check for underlying health conditions.  Performing an ultrasound exam to check for scarring.  Performing a test to check blood flow to the penis.  Doing a sleep study at home to measure nighttime erections.  How is this treated? This condition may be treated by:  Medicine taken by mouth to help you achieve an erection (oral medicine).  Hormone replacement therapy to replace low testosterone levels.  Medicine that is injected into the penis. Your health care provider may instruct you how to give yourself these injections at home.  Vacuum pump. This is a pump with a ring on it. The pump and ring are placed on the penis and used to create pressure that helps the penis become erect.  Penile implant surgery. In this procedure, you may receive: ? An inflatable implant. This consists of cylinders, a pump, and a reservoir. The cylinders can be inflated with a fluid that helps to create an erection, and they can be deflated   after intercourse. ? A semi-rigid implant. This consists of two silicone rubber rods. The rods provide some rigidity. They are also flexible, so the penis can both curve downward in its normal position and become straight for sexual intercourse.  Blood vessel surgery, to  improve blood flow to the penis. During this procedure, a blood vessel from a different part of the body is placed into the penis to allow blood to flow around (bypass) damaged or blocked blood vessels.  Lifestyle changes, such as exercising more, losing weight, and quitting smoking.  Follow these instructions at home: Medicines  Take over-the-counter and prescription medicines only as told by your health care provider. Do not increase the dosage without first discussing it with your health care provider.  If you are using self-injections, perform injections as directed by your health care provider. Make sure to avoid any veins that are on the surface of the penis. After giving an injection, apply pressure to the injection site for 5 minutes. General instructions  Exercise regularly, as directed by your health care provider. Work with your health care provider to lose weight, if needed.  Do not use any products that contain nicotine or tobacco, such as cigarettes and e-cigarettes. If you need help quitting, ask your health care provider.  Before using a vacuum pump, read the instructions that come with the pump and discuss any questions with your health care provider.  Keep all follow-up visits as told by your health care provider. This is important. Contact a health care provider if:  You feel nauseous.  You vomit. Get help right away if:  You are taking oral or injectable medicines and you have an erection that lasts longer than 4 hours. If your health care provider is unavailable, go to the nearest emergency room for evaluation. An erection that lasts much longer than 4 hours can result in permanent damage to your penis.  You have severe pain in your groin or abdomen.  You develop redness or severe swelling of your penis.  You have redness spreading up into your groin or lower abdomen.  You are unable to urinate.  You experience chest pain or a rapid heart beat (palpitations)  after taking oral medicines. Summary  Erectile dysfunction (ED) is the inability to get or keep an erection during sexual intercourse. This problem can usually be treated successfully.  This condition is diagnosed based on a physical exam, your symptoms, and tests to determine the cause. Treatment varies depending on the cause, and may include medicines, hormone therapy, surgery, or vacuum pump.  You may need follow-up visits to make sure that you are using your medicines or devices correctly.  Get help right away if you are taking or injecting medicines and you have an erection that lasts longer than 4 hours. This information is not intended to replace advice given to you by your health care provider. Make sure you discuss any questions you have with your health care provider. Document Released: 06/24/2000 Document Revised: 07/13/2016 Document Reviewed: 07/13/2016 Elsevier Interactive Patient Education  2017 Elsevier Inc.  

## 2016-11-22 NOTE — Progress Notes (Signed)
Chief Complaint  Patient presents with  . Hypertension    F/U    HPI   Hypertension: Patient here for follow-up of elevated blood pressure.  He saw Dr. Terence Lux for headache and was started on an extra medication. He was started on Hydralazine 50mg  bid.  He reports that he has not been back to work for the past week since he commutes to [employer] daily. He reports that he has improvement in his headaches but that now it is mild but is still there intermittently.  He is compliant with his CPAP.  BP Readings from Last 3 Encounters:  11/22/16 (!) 146/93  11/14/16 (!) 166/91  10/22/16 (!) 155/112    Tension headache Pt reports that he has been getting less headaches Now he really things the headache were from the bp issues He has not throbbing in his head  Erectile Dysfunction He noticed worsening erectile dysfunction.  He states that he occasionally does not achieve a full erection or may not get to ejaculate. It has been worse with the increased bp medications.     Past Medical History:  Diagnosis Date  . Allergy    allergy shots at [facility]  . Asthma   . Hypertension   . Sleep apnea     Current Outpatient Prescriptions  Medication Sig Dispense Refill  . fluticasone (FLONASE) 50 MCG/ACT nasal spray Place 2 sprays into both nostrils daily. 16 g 11  . losartan-hydrochlorothiazide (HYZAAR) 100-25 MG tablet Take 1 tablet by mouth daily. 90 tablet 3  . Multiple Vitamins-Minerals (ADEKS) chewable tablet Chew 1 tablet by mouth daily.    Marland Kitchen amLODipine (NORVASC) 10 MG tablet Take 1 tablet (10 mg total) by mouth daily. 90 tablet 3  . aspirin EC 81 MG tablet Take 1 tablet (81 mg total) by mouth daily.    . cloNIDine (CATAPRES - DOSED IN MG/24 HR) 0.1 mg/24hr patch Place 1 patch (0.1 mg total) onto the skin once a week. 4 patch 1  . hydrALAZINE (APRESOLINE) 50 MG tablet Take 1 tablet (50 mg total) by mouth 3 (three) times daily. 90 tablet 3  . ipratropium (ATROVENT) 0.06 %  nasal spray Place 2 sprays into both nostrils 4 (four) times daily. 15 mL 0  . tadalafil (CIALIS) 5 MG tablet Take 1 tablet (5 mg total) by mouth daily as needed for erectile dysfunction. (Patient not taking: Reported on 01/18/2017) 10 tablet 0   No current facility-administered medications for this visit.     Allergies: No Known Allergies  Past Surgical History:  Procedure Laterality Date  . ACHILLES TENDON REPAIR    . NASAL SINUS SURGERY      Social History   Social History  . Marital status: Single    Spouse name: N/A  . Number of children: 0  . Years of education: college   Occupational History  .  [employer]   Social History Main Topics  . Smoking status: Light Tobacco Smoker  . Smokeless tobacco: Never Used     Comment: 3 cig a day   . Alcohol use No  . Drug use: No  . Sexual activity: Yes   Other Topics Concern  . None   Social History Narrative   Marital status: married;       Children: none      Lives: with wife.      Employment: works at [employer]; Solicitor.      Tobacco: none      Alcohol: none  Drugs: none      Exercise: gym 4-5 days per week.   Denies  Caffeine use     ROS See hpi Review of Systems See HPI Constitution: No fevers or chills No malaise No diaphoresis Skin: No rash or itching Eyes: no blurry vision, no double vision GU: no dysuria or hematuria Neuro: no dizziness or headaches  Objective: Vitals:   11/22/16 1524 11/22/16 1625  BP: (!) 146/93 (!) 142/88  Pulse: 96   Resp: 18   Temp: 97.4 F (36.3 C)   TempSrc: Oral   SpO2: 99%   Weight: 264 lb (119.7 kg)   Height: 5\' 10"  (1.778 m)     Physical Exam  Constitutional: He is oriented to person, place, and time. He appears well-developed and well-nourished.  HENT:  Head: Normocephalic and atraumatic.  Eyes: Conjunctivae and EOM are normal.  Cardiovascular: Normal rate, regular rhythm and normal heart sounds.   Pulmonary/Chest: Effort normal and breath sounds  normal. No respiratory distress. He has no wheezes.  Abdominal: Soft. Bowel sounds are normal. He exhibits no distension. There is no tenderness.  Neurological: He is alert and oriented to person, place, and time.  Skin: Skin is warm. Capillary refill takes less than 2 seconds.  Psychiatric: He has a normal mood and affect. His behavior is normal. Judgment and thought content normal.    Assessment and Plan Dylan Washington was seen today for hypertension.  Diagnoses and all orders for this visit:  Essential hypertension- bp improved  Chronic tension-type headache, not intractable- improved with improve bp control  Erectile dysfunction due to arterial insufficiency-  Gave trial of cialis  Other orders -     tadalafil (CIALIS) 5 MG tablet; Take 1 tablet (5 mg total) by mouth daily as needed for erectile dysfunction. (Patient not taking: Reported on 01/18/2017)   A total of 25 minutes were spent face-to-face with the patient during this encounter and over half of that time was spent on counseling and coordination of care. Spent time counseling about erectile dysfunction and how bp control can lead to ED Discussed options for treatment  Dylan Washington A Dylan LevinStallings

## 2016-11-29 ENCOUNTER — Ambulatory Visit (INDEPENDENT_AMBULATORY_CARE_PROVIDER_SITE_OTHER): Payer: Federal, State, Local not specified - PPO | Admitting: Family Medicine

## 2016-11-29 ENCOUNTER — Encounter: Payer: Self-pay | Admitting: Family Medicine

## 2016-11-29 VITALS — BP 138/86 | HR 60 | Temp 98.0°F | Resp 18 | Ht 70.0 in | Wt 268.0 lb

## 2016-11-29 DIAGNOSIS — I10 Essential (primary) hypertension: Secondary | ICD-10-CM | POA: Diagnosis not present

## 2016-11-29 DIAGNOSIS — G44229 Chronic tension-type headache, not intractable: Secondary | ICD-10-CM

## 2016-11-29 NOTE — Patient Instructions (Addendum)
   IF you received an x-ray today, you will receive an invoice from [facility]. Please contact [facility] at [phone] with questions or concerns regarding your invoice.   IF you received labwork today, you will receive an invoice from [facility]. Please contact [facility] at [phone] with questions or concerns regarding your invoice.   Our billing staff will not be able to assist you with questions regarding bills from these companies.  You will be contacted with the lab results as soon as they are available. The fastest way to get your results is to activate your My Chart account. Instructions are located on the last page of this paperwork. If you have not heard from us regarding the results in 2 weeks, please contact this office.      How to Take Your Blood Pressure Blood pressure is a measurement of how strongly your blood is pressing against the walls of your arteries. Arteries are blood vessels that carry blood from your heart throughout your body. Your health care provider takes your blood pressure at each office visit. You can also take your own blood pressure at home with a blood pressure machine. You may need to take your own blood pressure:  To confirm a diagnosis of high blood pressure (hypertension).  To monitor your blood pressure over time.  To make sure your blood pressure medicine is working. Supplies needed: To take your blood pressure, you will need a blood pressure machine. You can buy a blood pressure machine, or blood pressure monitor, at most drugstores or online. There are several types of home blood pressure monitors. When choosing one, consider the following:  Choose a monitor that has an arm cuff.  Choose a monitor that wraps snugly around your upper arm. You should be able to fit only one finger between your arm and the cuff.  Do not choose a monitor that measures your blood pressure from your wrist or finger. Your health care  provider can suggest a reliable monitor that will meet your needs. How to prepare To get the most accurate reading, avoid the following for 30 minutes before you check your blood pressure:  Drinking caffeine.  Drinking alcohol.  Eating.  Smoking.  Exercising. Five minutes before you check your blood pressure:  Empty your bladder.  Sit quietly without talking in a dining chair, rather than in a soft couch or armchair. How to take your blood pressure To check your blood pressure, follow the instructions in the manual that came with your blood pressure monitor. If you have a digital blood pressure monitor, the instructions may be as follows: 1. Sit up straight. 2. Place your feet on the floor. Do not cross your ankles or legs. 3. Rest your left arm at the level of your heart on a table or desk or on the arm of a chair. 4. Pull up your shirt sleeve. 5. Wrap the blood pressure cuff around the upper part of your left arm, 1 inch (2.5 cm) above your elbow. It is best to wrap the cuff around bare skin. 6. Fit the cuff snugly around your arm. You should be able to place only one finger between the cuff and your arm. 7. Position the cord inside the groove of your elbow. 8. Press the power button. 9. Sit quietly while the cuff inflates and deflates. 10. Read the digital reading on the monitor screen and write it down (record it). 11. Wait 2-3 minutes, then repeat the steps, starting at step 1.   What does my blood pressure reading mean? A blood pressure reading consists of a higher number over a lower number. Ideally, your blood pressure should be below 120/80. The first ("top") number is called the systolic pressure. It is a measure of the pressure in your arteries as your heart beats. The second ("bottom") number is called the diastolic pressure. It is a measure of the pressure in your arteries as the heart relaxes. Blood pressure is classified into four stages. The following are the stages for  adults who do not have a short-term serious illness or a chronic condition. Systolic pressure and diastolic pressure are measured in a unit called mm Hg. Normal   Systolic pressure: below 120.  Diastolic pressure: below 80. Elevated   Systolic pressure: 120-129.  Diastolic pressure: below 80. Hypertension stage 1   Systolic pressure: 130-139.  Diastolic pressure: 80-89. Hypertension stage 2   Systolic pressure: 140 or above.  Diastolic pressure: 90 or above. You can have prehypertension or hypertension even if only the systolic or only the diastolic number in your reading is higher than normal. Follow these instructions at home:  Check your blood pressure as often as recommended by your health care provider.  Take your monitor to the next appointment with your health care provider to make sure:  That you are using it correctly.  That it provides accurate readings.  Be sure you understand what your goal blood pressure numbers are.  Tell your health care provider if you are having any side effects from blood pressure medicine. Contact a health care provider if:  Your blood pressure is consistently high. Get help right away if:  Your systolic blood pressure is higher than 180.  Your diastolic blood pressure is higher than 110. This information is not intended to replace advice given to you by your health care provider. Make sure you discuss any questions you have with your health care provider. Document Released: 12/04/2015 Document Revised: 02/16/2016 Document Reviewed: 12/04/2015 Elsevier Interactive Patient Education  2017 Elsevier Inc.  

## 2016-11-29 NOTE — Progress Notes (Signed)
Chief Complaint  Patient presents with  . recheck blood pressure    patient is here to get paperwork for work and to recheck bp    HPI  Pt reports that his blood pressure is slowly improving His headache has resolved He denies chest pains, palpitations, shortness of breath or vision changes He is compliant with his bp meds He is exercising by walking  BP Readings from Last 3 Encounters:  11/29/16 138/86  11/22/16 (!) 142/88  11/14/16 (!) 166/91    He is walking 2 miles per day He feels like he is losing weight He works night shifts and he sleeps in the day time Wt Readings from Last 3 Encounters:  11/29/16 268 lb (121.6 kg)  11/22/16 264 lb (119.7 kg)  11/14/16 266 lb 12.8 oz (121 kg)  Body mass index is 38.45 kg/m.   Past Medical History:  Diagnosis Date  . Allergy    allergy shots at [facility]  . Asthma   . Hypertension   . Sleep apnea     Current Outpatient Prescriptions  Medication Sig Dispense Refill  . amLODipine (NORVASC) 10 MG tablet Take 1 tablet (10 mg total) by mouth daily. 90 tablet 0  . hydrALAZINE (APRESOLINE) 50 MG tablet Take 1 tablet (50 mg total) by mouth 2 (two) times daily. 60 tablet 3  . losartan-hydrochlorothiazide (HYZAAR) 100-25 MG tablet Take 1 tablet by mouth daily. 90 tablet 3  . Multiple Vitamins-Minerals (ADEKS) chewable tablet Chew 1 tablet by mouth daily.    . tadalafil (CIALIS) 5 MG tablet Take 1 tablet (5 mg total) by mouth daily as needed for erectile dysfunction. 10 tablet 0  . fluticasone (FLONASE) 50 MCG/ACT nasal spray Place 2 sprays into both nostrils daily. (Patient not taking: Reported on 11/29/2016) 16 g 11   No current facility-administered medications for this visit.     Allergies: No Known Allergies  Past Surgical History:  Procedure Laterality Date  . ACHILLES TENDON REPAIR    . NASAL SINUS SURGERY      Social History   Social History  . Marital status: Single    Spouse name: N/A  . Number of  children: 0  . Years of education: college   Occupational History  .  [employer]   Social History Main Topics  . Smoking status: Light Tobacco Smoker  . Smokeless tobacco: Never Used     Comment: 3 cig a day   . Alcohol use No  . Drug use: No  . Sexual activity: Yes   Other Topics Concern  . None   Social History Narrative   Marital status: married;       Children: none      Lives: with wife.      Employment: works at [employer]; Solicitorclerk.      Tobacco: none      Alcohol: none      Drugs: none      Exercise: gym 4-5 days per week.   Denies  Caffeine use     ROS  Objective: Vitals:   11/29/16 0922 11/29/16 0945  BP: (!) 151/93 138/86  Pulse: 60   Resp: 18   Temp: 98 F (36.7 C)   TempSrc: Oral   SpO2: 98%   Weight: 268 lb (121.6 kg)   Height: 5\' 10"  (1.778 m)     Physical Exam  Constitutional: He is oriented to person, place, and time. He appears well-developed and well-nourished.  HENT:  Head: Normocephalic and  atraumatic.  Cardiovascular: Normal rate, regular rhythm and normal heart sounds.   No murmur heard. Pulmonary/Chest: Effort normal and breath sounds normal. No respiratory distress. He has no wheezes.  Neurological: He is alert and oriented to person, place, and time.  Psychiatric: He has a normal mood and affect. His behavior is normal. Judgment and thought content normal.    Assessment and Plan Ameer was seen today for recheck blood pressure.  Diagnoses and all orders for this visit:  Essential hypertension- bp improved, cpm Gave work note  Chronic tension-type headache, not intractable- improved with improved bp control Given work note to return to work    [employer]

## 2016-11-30 ENCOUNTER — Encounter: Payer: Self-pay | Admitting: Family Medicine

## 2016-11-30 ENCOUNTER — Telehealth: Payer: Self-pay

## 2016-11-30 NOTE — Telephone Encounter (Signed)
Request was filed with cover my meds on 11-23-2016. I was out of the office for a few days, and upon my return, the request was denied.  Called patient to tell him about the denial and some pharmacies where he could pay out of pocket, however, he was not interested in hearing them.

## 2016-12-01 DIAGNOSIS — G4733 Obstructive sleep apnea (adult) (pediatric): Secondary | ICD-10-CM | POA: Diagnosis not present

## 2016-12-08 ENCOUNTER — Telehealth: Payer: Self-pay | Admitting: Family Medicine

## 2016-12-08 DIAGNOSIS — R519 Headache, unspecified: Secondary | ICD-10-CM

## 2016-12-08 DIAGNOSIS — I10 Essential (primary) hypertension: Secondary | ICD-10-CM

## 2016-12-08 DIAGNOSIS — R51 Headache: Principal | ICD-10-CM

## 2016-12-08 NOTE — Telephone Encounter (Signed)
Pt states that he talked with stallings and sagardia about getting an MRI of the brain and he now wants to have that done   [facility] number [phone]

## 2016-12-09 NOTE — Telephone Encounter (Signed)
I see an order placed by Dr. Gwendolyn GrantWalden on 10/20/16, is it appropriate to go on and schedule.

## 2016-12-12 NOTE — Telephone Encounter (Signed)
Please go ahead and schedule the MRI

## 2016-12-12 NOTE — Telephone Encounter (Signed)
Called [facility] scheduling and order priority will have to be changed to routine instead of STAT since ordered in April and not yet performed before able to schedule.

## 2016-12-15 NOTE — Telephone Encounter (Signed)
Left detailed message with MRI appointment and number if needed to reschedule. Advise if patient returns message

## 2016-12-15 NOTE — Telephone Encounter (Signed)
Pt MRI scheduled at [facility] on 6/13 at 1pm. Pt is to be there at 12:45pm. I called pt to let him know and was not able to leave a detailed message so told him to call back. If pt calls back, this is the information I was going to tell him. If he would like to reschedule appt he can call [facility] at [phone]. Thanks!

## 2016-12-21 ENCOUNTER — Telehealth: Payer: Self-pay | Admitting: Family Medicine

## 2016-12-21 ENCOUNTER — Ambulatory Visit (HOSPITAL_COMMUNITY)
Admission: RE | Admit: 2016-12-21 | Discharge: 2016-12-21 | Disposition: A | Discharge status: Home / Self Care | Payer: Federal, State, Local not specified - PPO | Source: Ambulatory Visit | Attending: Family Medicine | Admitting: Family Medicine

## 2016-12-21 DIAGNOSIS — R519 Headache, unspecified: Secondary | ICD-10-CM

## 2016-12-21 DIAGNOSIS — I10 Essential (primary) hypertension: Secondary | ICD-10-CM | POA: Diagnosis not present

## 2016-12-21 DIAGNOSIS — R51 Headache: Secondary | ICD-10-CM | POA: Diagnosis not present

## 2016-12-21 NOTE — Telephone Encounter (Signed)
PATIENT STATES HE HAS LOST HIS PRESCRIPTION FOR HIS LOSARTAN. HE WAS IN TO SEE DR. SAGARDIA IN MAY FOR HIS BLOOD PRESSURE AND HE ALSO SAW DR. [name] TWICE IN MAY. HE DOES NOT REMEMBER WHO INCREASED HIS MEDICINE SO HE DOES NOT KNOW WHAT HIS MG SHOULD BE? HE JUST NEEDS IT CALLED INTO HIS PHARMACY. [facility] PHONE [phone] (CELL) PHARMACY CHOICE IS [facility] ON [address]. MBC

## 2016-12-22 ENCOUNTER — Encounter: Payer: Self-pay | Admitting: Family Medicine

## 2016-12-22 ENCOUNTER — Ambulatory Visit (INDEPENDENT_AMBULATORY_CARE_PROVIDER_SITE_OTHER): Payer: Federal, State, Local not specified - PPO | Admitting: Family Medicine

## 2016-12-22 VITALS — BP 126/90 | HR 96 | Temp 99.4°F | Resp 16 | Ht 70.0 in | Wt 261.2 lb

## 2016-12-22 DIAGNOSIS — I10 Essential (primary) hypertension: Secondary | ICD-10-CM

## 2016-12-22 DIAGNOSIS — G44229 Chronic tension-type headache, not intractable: Secondary | ICD-10-CM

## 2016-12-22 MED ORDER — AMLODIPINE BESYLATE 10 MG PO TABS: 10.0000 mg | ORAL_TABLET | Freq: Every day | ORAL | 3 refills | Status: DC

## 2016-12-22 MED ORDER — ASPIRIN EC 81 MG PO TBEC: 81.0000 mg | DELAYED_RELEASE_TABLET | Freq: Every day | ORAL | Status: AC

## 2016-12-22 MED ORDER — HYDRALAZINE HCL 50 MG PO TABS: 50.0000 mg | ORAL_TABLET | Freq: Three times a day (TID) | ORAL | 3 refills | Status: DC

## 2016-12-22 NOTE — Progress Notes (Signed)
Chief Complaint  Patient presents with  . Follow-up  . MRI results    Brain    HPI   Hypertension: Patient here for follow-up of elevated blood pressure.  BP Readings from Last 3 Encounters:  12/22/16 (!) 144/82  11/29/16 138/86  11/22/16 (!) 142/88   Pt reports that he is taking the amlodipine twice a day for 10mg  twice a day.  He takes amlodipine 10mg  then he takes 5mg   He reports that he would take an extra dose of his amlodipine to get him over the edge He would takes another 5mg  of amlodipine in 2-3 hours since his face looks so dark. He stats that his face looks dark so he thinks his pressure is high so he keeps.   Past Medical History:  Diagnosis Date  . Allergy    [facility]  . Asthma   . Hypertension   . Sleep apnea     Current Outpatient Prescriptions  Medication Sig Dispense Refill  . amLODipine (NORVASC) 10 MG tablet Take 1 tablet (10 mg total) by mouth daily. 90 tablet 3  . fluticasone (FLONASE) 50 MCG/ACT nasal spray Place 2 sprays into both nostrils daily. 16 g 11  . losartan-hydrochlorothiazide (HYZAAR) 100-25 MG tablet Take 1 tablet by mouth daily. 90 tablet 3  . Multiple Vitamins-Minerals (ADEKS) chewable tablet Chew 1 tablet by mouth daily.    . tadalafil (CIALIS) 5 MG tablet Take 1 tablet (5 mg total) by mouth daily as needed for erectile dysfunction. 10 tablet 0  . aspirin EC 81 MG tablet Take 1 tablet (81 mg total) by mouth daily.    . hydrALAZINE (APRESOLINE) 50 MG tablet Take 1 tablet (50 mg total) by mouth 3 (three) times daily. 90 tablet 3   No current facility-administered medications for this visit.     Allergies: No Known Allergies  Past Surgical History:  Procedure Laterality Date  . ACHILLES TENDON REPAIR    . NASAL SINUS SURGERY      Social History   Social History  . Marital status: Single    Spouse name: N/A  . Number of children: 0  . Years of education: college   Occupational History  .  [employer]   Social History Main Topics  . Smoking status: Light Tobacco Smoker  . Smokeless tobacco: Never Used     Comment: 3 cig a day   . Alcohol use No  . Drug use: No  . Sexual activity: Yes   Other Topics Concern  . None   Social History Narrative   Marital status: married;       Children: none      Lives: with wife.      Employment: works at [employer]; Solicitorclerk.      Tobacco: none      Alcohol: none      Drugs: none      Exercise: gym 4-5 days per week.   Denies  Caffeine use     ROS  Objective: Vitals:   12/22/16 1715  BP: (!) 144/82  Pulse: 96  Resp: 16  Temp: 99.4 F (37.4 C)  TempSrc: Oral  SpO2: 100%  Weight: 261 lb 3.2 oz (118.5 kg)  Height: 5\' 10"  (1.778 m)      Physical Exam  Constitutional: He is oriented to person, place, and time. He appears well-developed and well-nourished.  HENT:  Head: Normocephalic and atraumatic.  Eyes: Conjunctivae and EOM are normal.  Cardiovascular: Normal  rate, regular rhythm and normal heart sounds.   Pulmonary/Chest: Effort normal and breath sounds normal. No respiratory distress. He has no wheezes.  Neurological: He is alert and oriented to person, place, and time.  Skin: Skin is warm. Capillary refill takes less than 2 seconds. No erythema.  Psychiatric: He has a normal mood and affect. His behavior is normal. Judgment and thought content normal.      CLINICAL DATA:  Intermittent occipital headaches over the last several months.  EXAM: MRI HEAD WITHOUT CONTRAST  TECHNIQUE: Multiplanar, multiecho pulse sequences of the brain and surrounding structures were obtained without intravenous contrast.  COMPARISON:  Head CT 10/21/2016 and 11/24/2010 sinus CT  FINDINGS: Brain: The brain has normal appearance without evidence of malformation, atrophy, old or acute small or large vessel infarction, hemorrhage, hydrocephalus or extra-axial collection. No pituitary abnormality.  Vascular: Major vessels at  the base of the brain show flow.  Skull and upper cervical spine: Normal  Sinuses/Orbits: Clear/ normal.  Other: None significant.  IMPRESSION: Normal examination.  No cause of headache identified.   Electronically Signed   By: Paulina Fusi M.D.   On: 12/21/2016 13:45   Assessment and Plan Cristan was seen today for follow-up and mri results.  Diagnoses and all orders for this visit:  Chronic tension-type headache, not intractable- MRI normal, discussed that this is stress   Essential hypertension- advised pt to stick to amlodipine 10mg  a day and to increase hydralazine to tid Discussed that the blood vessels in the face can cause the face to have a different hue. He should manage his stress better  Added a baby aspirin for primary prevention -     amLODipine (NORVASC) 10 MG tablet; Take 1 tablet (10 mg total) by mouth daily.  Other orders -     hydrALAZINE (APRESOLINE) 50 MG tablet; Take 1 tablet (50 mg total) by mouth 3 (three) times daily. -     aspirin EC 81 MG tablet; Take 1 tablet (81 mg total) by mouth daily.  A total of 25 minutes were spent face-to-face with the patient during this encounter and over half of that time was spent on counseling and coordination of care.    Zoe A Stallings

## 2016-12-22 NOTE — Patient Instructions (Addendum)
Increase Hydralazine to three times a day Amlodipine is once daily  You should add an Aspirin 81mg  daily It is helpful to take Garlique, flax seed, ginger or tumeric added to your food or as a supplement Romeo AppleGarlique is a good option as a capsule I recommend cooking with ginger, flax seed and tumeric.    IF you received an x-ray today, you will receive an invoice from [facility]. Please contact [facility] at [phone] with questions or concerns regarding your invoice.   IF you received labwork today, you will receive an invoice from [facility]. Please contact [facility] at [phone] with questions or concerns regarding your invoice.   Our billing staff will not be able to assist you with questions regarding bills from these companies.  You will be contacted with the lab results as soon as they are available. The fastest way to get your results is to activate your My Chart account. Instructions are located on the last page of this paperwork. If you have not heard from us regarding the results in 2 weeks, please contact this office.     How to Take Your Blood Pressure You can take your blood pressure at home with a machine. You may need to check your blood pressure at home:  To check if you have high blood pressure (hypertension).  To check your blood pressure over time.  To make sure your blood pressure medicine is working.  Supplies needed: You will need a blood pressure machine, or monitor. You can buy one at a drugstore or online. When choosing one:  Choose one with an arm cuff.  Choose one that wraps around your upper arm. Only one finger should fit between your arm and the cuff.  Do not choose one that measures your blood pressure from your wrist or finger.  Your doctor can suggest a monitor. How to prepare Avoid these things for 30 minutes before checking your blood pressure:  Drinking caffeine.  Drinking  alcohol.  Eating.  Smoking.  Exercising.  Five minutes before checking your blood pressure:  Pee.  Sit in a dining chair. Avoid sitting in a soft couch or armchair.  Be quiet. Do not talk.  How to take your blood pressure Follow the instructions that came with your machine. If you have a digital blood pressure monitor, these may be the instructions: 1. Sit up straight. 2. Place your feet on the floor. Do not cross your ankles or legs. 3. Rest your left arm at the level of your heart. You may rest it on a table, desk, or chair. 4. Pull up your shirt sleeve. 5. Wrap the blood pressure cuff around the upper part of your left arm. The cuff should be 1 inch (2.5 cm) above your elbow. It is best to wrap the cuff around bare skin. 6. Fit the cuff snugly around your arm. You should be able to place only one finger between the cuff and your arm. 7. Put the cord inside the groove of your elbow. 8. Press the power button. 9. Sit quietly while the cuff fills with air and loses air. 10. Write down the numbers on the screen. 11. Wait 2-3 minutes and then repeat steps 1-10.  What do the numbers mean? Two numbers make up your blood pressure. The first number is called systolic pressure. The second is called diastolic pressure. An example of a blood pressure reading is "120 over 80" (or 120/80). If you are an adult and do not have a medical  condition, use this guide to find out if your blood pressure is normal: Normal  First number: below 120.  Second number: below 80. Elevated  First number: 120-129.  Second number: below 80. Hypertension stage 1  First number: 130-139.  Second number: 80-89. Hypertension stage 2  First number: 140 or above.  Second number: 90 or above. Your blood pressure is above normal even if only the top or bottom number is above normal. Follow these instructions at home:  Check your blood pressure as often as your doctor tells you to.  Take your  monitor to your next doctor's appointment. Your doctor will: ? Make sure you are using it correctly. ? Make sure it is working right.  Make sure you understand what your blood pressure numbers should be.  Tell your doctor if your medicines are causing side effects. Contact a doctor if:  Your blood pressure keeps being high. Get help right away if:  Your first blood pressure number is higher than 180.  Your second blood pressure number is higher than 120. This information is not intended to replace advice given to you by your health care provider. Make sure you discuss any questions you have with your health care provider. Document Released: 06/09/2008 Document Revised: 05/25/2016 Document Reviewed: 12/04/2015 Elsevier Interactive Patient Education  [employer].

## 2016-12-27 ENCOUNTER — Ambulatory Visit (HOSPITAL_COMMUNITY)
Admission: EM | Admit: 2016-12-27 | Discharge: 2016-12-27 | Disposition: A | Discharge status: Home / Self Care | Payer: Federal, State, Local not specified - PPO | Attending: Family Medicine | Admitting: Family Medicine

## 2016-12-27 ENCOUNTER — Encounter: Payer: Self-pay | Admitting: Neurology

## 2016-12-27 ENCOUNTER — Telehealth: Payer: Self-pay | Admitting: Neurology

## 2016-12-27 ENCOUNTER — Encounter (HOSPITAL_COMMUNITY): Payer: Self-pay | Admitting: Emergency Medicine

## 2016-12-27 DIAGNOSIS — J00 Acute nasopharyngitis [common cold]: Secondary | ICD-10-CM | POA: Diagnosis not present

## 2016-12-27 DIAGNOSIS — Z9989 Dependence on other enabling machines and devices: Secondary | ICD-10-CM

## 2016-12-27 DIAGNOSIS — G4733 Obstructive sleep apnea (adult) (pediatric): Secondary | ICD-10-CM

## 2016-12-27 MED ORDER — METHYLPREDNISOLONE ACETATE 80 MG/ML IJ SUSP
80.0000 mg | Freq: Once | INTRAMUSCULAR | Status: AC
  Administered 2016-12-27: 80 mg via INTRAMUSCULAR

## 2016-12-27 MED ORDER — METHYLPREDNISOLONE ACETATE 80 MG/ML IJ SUSP
INTRAMUSCULAR | Status: AC
  Filled 2016-12-27: qty 1

## 2016-12-27 MED ORDER — AZITHROMYCIN 250 MG PO TABS: 250.0000 mg | ORAL_TABLET | Freq: Every day | ORAL | 0 refills | Status: DC

## 2016-12-27 MED ORDER — IPRATROPIUM BROMIDE 0.06 % NA SOLN: 2.0000 | Freq: Four times a day (QID) | NASAL | 0 refills | Status: DC

## 2016-12-27 NOTE — ED Triage Notes (Signed)
The patient presented to the [facility] with a complaint of sinus pain and pressure x 3 days. 

## 2016-12-27 NOTE — Telephone Encounter (Signed)
Pt seen 6/14 by Dr. [name] and had his BP meds adjusted.

## 2016-12-27 NOTE — Telephone Encounter (Signed)
Patient called office in reference to CPAP machine feeling pressure is to high causing patient to cough and feeling that his nose gets to hot.  Patient contacted DME and advised patient to contact us.  Please call

## 2016-12-27 NOTE — ED Provider Notes (Signed)
CSN: [id number]     Arrival date & time 12/27/16  1258 History   None    Chief Complaint  Patient presents with  . Facial Pain   (Consider location/radiation/quality/duration/timing/severity/associated sxs/prior Treatment) Patient presents to [facility] of sinus pain, pressure, and uri sx's for 3 days.   The history is provided by the patient.  URI  Presenting symptoms: congestion, facial pain and rhinorrhea   Severity:  Moderate Onset quality:  Sudden Duration:  3 days Timing:  Constant Progression:  Worsening Chronicity:  New Relieved by:  Nothing   Past Medical History:  Diagnosis Date  . Allergy    allergy shots at [facility]  . Asthma   . Hypertension   . Sleep apnea    Past Surgical History:  Procedure Laterality Date  . ACHILLES TENDON REPAIR    . NASAL SINUS SURGERY     Family History  Problem Relation Age of Onset  . Hypertension Mother   . Cancer Mother   . Hypertension Father    Social History  Substance Use Topics  . Smoking status: Light Tobacco Smoker  . Smokeless tobacco: Never Used     Comment: 3 cig a day   . Alcohol use No    Review of Systems  Constitutional: Negative.   HENT: Positive for congestion and rhinorrhea.   Eyes: Negative.   Respiratory: Negative.   Cardiovascular: Negative.   Gastrointestinal: Negative.   Endocrine: Negative.   Genitourinary: Negative.   Musculoskeletal: Negative.   Allergic/Immunologic: Negative.   Neurological: Negative.   Hematological: Negative.   Psychiatric/Behavioral: Negative.     Allergies  Patient has no known allergies.  Home Medications   Prior to Admission medications   Medication Sig Start Date End Date Taking? Authorizing Provider  amLODipine (NORVASC) 10 MG tablet Take 1 tablet (10 mg total) by mouth daily. 12/22/16   Doristine Bosworth, MD  aspirin EC 81 MG tablet Take 1 tablet (81 mg total) by mouth daily. 12/22/16   Doristine Bosworth, MD  azithromycin (ZITHROMAX) 250 MG tablet  Take 1 tablet (250 mg total) by mouth daily. Take first 2 tablets together, then 1 every day until finished. 12/27/16   Deatra Canter, FNP  fluticasone (FLONASE) 50 MCG/ACT nasal spray Place 2 sprays into both nostrils daily. 10/22/16   Elvina Sidle, MD  hydrALAZINE (APRESOLINE) 50 MG tablet Take 1 tablet (50 mg total) by mouth 3 (three) times daily. 12/22/16 01/21/17  Doristine Bosworth, MD  ipratropium (ATROVENT) 0.06 % nasal spray Place 2 sprays into both nostrils 4 (four) times daily. 12/27/16   Deatra Canter, FNP  losartan-hydrochlorothiazide (HYZAAR) 100-25 MG tablet Take 1 tablet by mouth daily. 07/20/16   Georgina Quint, MD  Multiple Vitamins-Minerals (ADEKS) chewable tablet Chew 1 tablet by mouth daily.    [provider]  tadalafil (CIALIS) 5 MG tablet Take 1 tablet (5 mg total) by mouth daily as needed for erectile dysfunction. 11/22/16   Doristine Bosworth, MD   Meds Ordered and Administered this Visit   Medications  methylPREDNISolone acetate (DEPO-MEDROL) injection 80 mg (80 mg Intramuscular Given 12/27/16 1337)    BP (!) 171/103 (BP Location: Right Arm)   Pulse 87   Temp 98.6 F (37 C) (Oral)   Resp 18   SpO2 100%  No data found.   Physical Exam  Constitutional: He appears well-developed and well-nourished.  HENT:  Head: Normocephalic and atraumatic.  Right Ear: External ear normal.  Left Ear: External  ear normal.  Mouth/Throat: Oropharynx is clear and moist.  Eyes: Conjunctivae and EOM are normal. Pupils are equal, round, and reactive to light.  Neck: Normal range of motion. Neck supple.  Cardiovascular: Normal rate, regular rhythm and normal heart sounds.   Pulmonary/Chest: Effort normal and breath sounds normal.  Nursing note and vitals reviewed.   Urgent Care Course     Procedures (including critical care time)  Labs Review Labs Reviewed - No data to display  Imaging Review No results found.   Visual Acuity Review  Right Eye  Distance:   Left Eye Distance:   Bilateral Distance:    Right Eye Near:   Left Eye Near:    Bilateral Near:         MDM   1. Acute nasopharyngitis    depomedrol 80mg  IM Atrovent nasal spray zpak  Push po fluids, rest, tylenol and motrin otc prn as directed for fever, arthralgias, and myalgias.  Follow up prn if sx's continue or persist.    Deatra CanterOxford, Ranyah Groeneveld J, FNP 12/27/16 1417

## 2016-12-28 NOTE — Telephone Encounter (Signed)
I called pt. He reports that his nose gets "hot" which causes him to cough. He is also waking up with morning headaches and wants to know if this could be related to too low or too high pressures. Pt was also under the impression that he was set at 15 cm. I advised him that his report says 9.4 cm but we are working with [facility] to figure this out. Pt wants to know if his pressure should be adjusted.

## 2016-12-28 NOTE — Telephone Encounter (Signed)
I reviewed his compliance data for the past 30 days, he is 87% compliant, average usage of 6 hours and 18 minutes, said pressure is 9.4 and AHI suboptimal at 5.5, leak on the higher side with the 95th percentile at 25.2 L/m. I will change the CPAP pressure to 12 cm for now, will need 30 day download, also leak on the high side, needs to get in touch with DME about mask refit.  Please call patient and send order to DME. thx

## 2016-12-28 NOTE — Addendum Note (Signed)
Addended by: Huston FoleyATHAR, Duffy Dantonio on: 12/28/2016 05:43 PM   Modules accepted: Orders

## 2016-12-28 NOTE — Telephone Encounter (Signed)
I checked pt's compliance report. His report says the setting is 9.4 cm H2O for pt's cpap. Per Dr. Teofilo PodAthar's order from April of this year, the setting was supposed to be at 15 cm H2O. I reached out to [facility] for clarification. This is the response from [facility]: "I looked and you are right.  I checked with my therapist and she was confused as to how that happened and at a decimal point.  I corrected the pressure."

## 2016-12-29 NOTE — Telephone Encounter (Signed)
I called pt to discuss. No answer, left a message asking him to call me back. 

## 2017-01-01 DIAGNOSIS — G4733 Obstructive sleep apnea (adult) (pediatric): Secondary | ICD-10-CM | POA: Diagnosis not present

## 2017-01-02 ENCOUNTER — Encounter (HOSPITAL_COMMUNITY): Payer: Self-pay | Admitting: Emergency Medicine

## 2017-01-02 ENCOUNTER — Ambulatory Visit (HOSPITAL_COMMUNITY)
Admission: EM | Admit: 2017-01-02 | Discharge: 2017-01-02 | Disposition: A | Discharge status: Home / Self Care | Payer: Federal, State, Local not specified - PPO | Attending: Family Medicine | Admitting: Family Medicine

## 2017-01-02 DIAGNOSIS — R0981 Nasal congestion: Secondary | ICD-10-CM

## 2017-01-02 NOTE — ED Triage Notes (Signed)
Patient thinks steroid shot caused "vertigo" but sinus concerns are no different.

## 2017-01-02 NOTE — Telephone Encounter (Signed)
I called pt again to discuss. No answer, left a message asking him to call me back. 

## 2017-01-02 NOTE — ED Provider Notes (Signed)
[facility]    CSN: [id number] Arrival date & time: 01/02/17  1541     History   Chief Complaint Chief Complaint  Patient presents with  . Dizziness    HPI Dylan Washington is a 56 y.o. male.   Patient was seen 6 days ago and received a cortisone shot which he thinks may be causing him to be dizzy. He was thought to have a sinus infection but is sinus symptoms have not improved.  He wants no to say if he can go back to work. He knows he has chronic sinus issues which she's had for years. He is on CPAP and taking his medicines and has no shortness of breath or change in sinus symptoms.  He's taking his blood pressure seriously and trying to be regular with his medicines. He is about to sell his house in [place] and moved to [place] where he works for the [employer].  He's tried a variety of nasal steroids and other sprays help with congestion but they haven't been helpful.      Past Medical History:  Diagnosis Date  . Allergy    allergy shots at [facility]  . Asthma   . Hypertension   . Sleep apnea     Patient Active Problem List   Diagnosis Date Noted  . Chronic tension-type headache, not intractable 11/14/2016  . Bone spur of foot 02/08/2016  . RBBB (right bundle branch block) 04/02/2014  . Asthma 01/17/2012  . Environmental allergies 08/16/2011  . Hypertension   . Sleep apnea     Past Surgical History:  Procedure Laterality Date  . ACHILLES TENDON REPAIR    . NASAL SINUS SURGERY         Home Medications    Prior to Admission medications   Medication Sig Start Date End Date Taking? Authorizing Provider  amLODipine (NORVASC) 10 MG tablet Take 1 tablet (10 mg total) by mouth daily. 12/22/16   Doristine BosworthStallings, Zoe A, MD  aspirin EC 81 MG tablet Take 1 tablet (81 mg total) by mouth daily. 12/22/16   Doristine BosworthStallings, Zoe A, MD  fluticasone (FLONASE) 50 MCG/ACT nasal spray Place 2 sprays into both nostrils daily. 10/22/16   Elvina SidleLauenstein, Herve Haug, MD    hydrALAZINE (APRESOLINE) 50 MG tablet Take 1 tablet (50 mg total) by mouth 3 (three) times daily. 12/22/16 01/21/17  Doristine BosworthStallings, Zoe A, MD  ipratropium (ATROVENT) 0.06 % nasal spray Place 2 sprays into both nostrils 4 (four) times daily. 12/27/16   Deatra Canterxford, William J, FNP  losartan-hydrochlorothiazide (HYZAAR) 100-25 MG tablet Take 1 tablet by mouth daily. 07/20/16   Georgina QuintSagardia, Miguel Jose, MD  Multiple Vitamins-Minerals (ADEKS) chewable tablet Chew 1 tablet by mouth daily.    [provider]  tadalafil (CIALIS) 5 MG tablet Take 1 tablet (5 mg total) by mouth daily as needed for erectile dysfunction. 11/22/16   Doristine BosworthStallings, Zoe A, MD    Family History Family History  Problem Relation Age of Onset  . Hypertension Mother   . Cancer Mother   . Hypertension Father     Social History Social History  Substance Use Topics  . Smoking status: Light Tobacco Smoker  . Smokeless tobacco: Never Used     Comment: 3 cig a day   . Alcohol use No     Allergies   Patient has no known allergies.   Review of Systems Review of Systems  HENT: Positive for congestion.   All other systems reviewed and are negative.  Physical Exam Triage Vital Signs ED Triage Vitals [01/02/17 1637]  Enc Vitals Group     BP (!) 158/104     Pulse Rate 82     Resp 18     Temp 98.9 F (37.2 C)     Temp Source Oral     SpO2 99 %     Weight      Height      Head Circumference      Peak Flow      Pain Score      Pain Loc      Pain Edu?      Excl. in GC?    No data found.   Updated Vital Signs BP (!) 158/104 (BP Location: Right Arm)   Pulse 82   Temp 98.9 F (37.2 C) (Oral)   Resp 18   SpO2 99%    Physical Exam  Constitutional: He is oriented to person, place, and time. He appears well-developed and well-nourished.  HENT:  Right Ear: External ear normal.  Left Ear: External ear normal.  Mouth/Throat: Oropharynx is clear and moist.  Eyes: Conjunctivae and EOM are normal. Pupils are equal,  round, and reactive to light.  Neck: Normal range of motion. Neck supple.  Cardiovascular: Normal rate and regular rhythm.   Pulmonary/Chest: Effort normal and breath sounds normal.  Musculoskeletal: Normal range of motion.  Neurological: He is alert and oriented to person, place, and time.  Skin: Skin is warm and dry.  Nursing note and vitals reviewed.    UC Treatments / Results  Labs (all labs ordered are listed, but only abnormal results are displayed) Labs Reviewed - No data to display  EKG  EKG Interpretation None       Radiology No results found.  Procedures Procedures (including critical care time)  Medications Ordered in UC Medications - No data to display   Initial Impression / Assessment and Plan / UC Course  I have reviewed the triage vital signs and the nursing notes.  Pertinent labs & imaging results that were available during my care of the patient were reviewed by me and considered in my medical decision making (see chart for details).       Final Clinical Impressions(s) / UC Diagnoses   Final diagnoses:  Sinus congestion    New Prescriptions Current Discharge Medication List       Elvina Sidle, MD 01/02/17 [phone]

## 2017-01-02 NOTE — Discharge Instructions (Signed)
Continue your blood pressure medicine and CPAP

## 2017-01-03 NOTE — Telephone Encounter (Signed)
Offered auto CPAP 5-15 cm with mask fit by [facility]. He can see dr Frances Furbishathar on 01-12-2017 and will have some data available for the visit. CD

## 2017-01-03 NOTE — Telephone Encounter (Signed)
Pt returned my call. I advised him that Dr. Frances FurbishAthar recommends that pt start his cpap at 12 cm H2O and talk to [facility] about a mask refit. Pt says that he does not really want his cpap set to one pressure and wants to try an auto pap. Pt says that he does not feel well at the current settings. Pt also does not want to wait until his appt on 7/5/20218 with Dr. Frances FurbishAthar to address further pressure changes. He is asking that the request to start an auto pap be sent to the covering sleep physician and he is requesting this to be handled ASAP.

## 2017-01-03 NOTE — Addendum Note (Signed)
Addended by: Melvyn NovasHMEIER, Vandana Haman on: 01/03/2017 05:46 PM   Modules accepted: Orders

## 2017-01-04 NOTE — Telephone Encounter (Signed)
I called pt. I advised him that the order for his auto pap will be sent to [facility]. Pt said that he will follow up with Dr. Frances FurbishAthar as scheduled on 01/12/2017 and knows to use his pap compliantly, per recommendations. Pt had no questions at this time but was encouraged to call back if questions arise.

## 2017-01-09 ENCOUNTER — Encounter: Payer: Self-pay | Admitting: Family Medicine

## 2017-01-09 ENCOUNTER — Ambulatory Visit (INDEPENDENT_AMBULATORY_CARE_PROVIDER_SITE_OTHER): Payer: Federal, State, Local not specified - PPO | Admitting: Family Medicine

## 2017-01-09 VITALS — BP 155/100 | HR 98 | Temp 98.9°F | Resp 16 | Ht 70.0 in | Wt 270.8 lb

## 2017-01-09 DIAGNOSIS — G44229 Chronic tension-type headache, not intractable: Secondary | ICD-10-CM

## 2017-01-09 DIAGNOSIS — I10 Essential (primary) hypertension: Secondary | ICD-10-CM | POA: Diagnosis not present

## 2017-01-09 DIAGNOSIS — G4733 Obstructive sleep apnea (adult) (pediatric): Secondary | ICD-10-CM | POA: Diagnosis not present

## 2017-01-09 DIAGNOSIS — Z9989 Dependence on other enabling machines and devices: Secondary | ICD-10-CM

## 2017-01-09 MED ORDER — CLONIDINE HCL 0.1 MG/24HR TD PTWK: 0.1000 mg | MEDICATED_PATCH | TRANSDERMAL | 1 refills | Status: DC

## 2017-01-09 NOTE — Patient Instructions (Addendum)
IF you received an x-ray today, you will receive an invoice from [facility]. Please contact [facility] at [phone] with questions or concerns regarding your invoice.   IF you received labwork today, you will receive an invoice from [facility]. Please contact [facility] at [phone] with questions or concerns regarding your invoice.   Our billing staff will not be able to assist you with questions regarding bills from these companies.  You will be contacted with the lab results as soon as they are available. The fastest way to get your results is to activate your My Chart account. Instructions are located on the last page of this paperwork. If you have not heard from us regarding the results in 2 weeks, please contact this office.    Clonidine skin patches What is this medicine? CLONIDINE (KLOE ni deen) is used to treat high blood pressure. This medicine may be used for other purposes; ask your health care provider or pharmacist if you have questions. COMMON BRAND NAME(S): Catapres-TTS How should I use this medicine? This medicine is for external use only. Follow the directions on the prescription label. Apply the patch to an area of the upper arm or part of the body that is clean, dry and hairless. Avoid injured, irritated, calloused, or scarred areas. Use a different site each time to prevent skin irritation. Do not cut or trim the patch. One patch should last for 7 days. Do not use your medicine more often than directed. Do not stop using except on the advice of your doctor or health care professional. You must gradually reduce the dose or you may get a dangerous increase in blood pressure. Talk to your pediatrician regarding the use of this medicine in children. Special care may be needed. Overdosage: If you think you have taken too much of this medicine contact a poison control center or emergency room at once. NOTE: This medicine is only for you. Do not share  this medicine with others. What if I miss a dose? Replace each patch on the same day of each week, or if the patch falls off. If you do forget to change the patch for two or three days, check with your doctor or health care professional.  What should I watch for while using this medicine? Visit your doctor or health care professional for regular checks on your progress. Check your heart rate and blood pressure regularly while you are using this medicine. Ask your doctor or health care professional what your heart rate should be and when you should contact him or her. You can shower or bathe with the skin patch in position. If the patch gets loose, cover it with the extra adhesive overlay provided. You may get drowsy or dizzy. Do not drive, use machinery, or do anything that needs mental alertness until you know how this medicine affects you. To avoid dizzy or fainting spells, do not stand or sit up quickly, especially if you are an older person. Alcohol can make you more drowsy and dizzy. Avoid alcoholic drinks. Your mouth may get dry. Chewing sugarless gum or sucking hard candy, and drinking plenty of water will help. Do not treat yourself for coughs, colds, or pain while you are using this medicine without asking your doctor or health care professional for advice. Some ingredients may increase your blood pressure. If you are going to have surgery tell your doctor or health care professional that you are using this medicine. If you are going to have  a magnetic resonance imaging (MRI) procedure, tell your MRI technician if you have this patch on your body. It must be removed before a MRI. What side effects may I notice from receiving this medicine? Side effects that you should report to your doctor or health care professional as soon as possible: -allergic reactions like skin rash, itching or hives, swelling of the face, lips, or tongue -anxiety, nervousness -chest pain -depression -fast, irregular  heartbeat -swelling of feet or legs -unusually weak or tired Side effects that usually do not require medical attention (report to your doctor or health care professional if they continue or are bothersome): -change in sex drive or performance -constipation -headache -skin redness, irritation, or darkening under the patch area This list may not describe all possible side effects. Call your doctor for medical advice about side effects. You may report side effects to FDA at 1-800-FDA-1088. Where should I keep my medicine? Keep out of the reach of children. Store at room temperature between 15 and 30 degrees C (59 to 86 degrees F). Throw away any unused medicine after the expiration date. NOTE: This sheet is a summary. It may not cover all possible information. If you have questions about this medicine, talk to your doctor, pharmacist, or health care provider.  2018 Elsevier/Gold Standard (2015-03-09 16:10:96)

## 2017-01-09 NOTE — Progress Notes (Signed)
Chief Complaint  Patient presents with  . Hypertension    recheck    HPI  Hypertension: Patient here for follow-up of elevated blood pressure. He is not exercising and is adherent to low salt diet.  Blood pressure is not well controlled at home. Cardiac symptoms none. Patient denies headaches.  Cardiovascular risk factors: hypertension and male gender. Use of agents associated with hypertension: none.  His home bp readings are high in the morning and reports that he feels like his bp is high because of the difficulty with his sleep and headaches related to the cpap He is driving back and forth to [place]. He reports that his appt is Thursday 01/12/17 at 9am. He reports that he would like to get his bp adjusted through his cpap.  BP Readings from Last 3 Encounters:  01/18/17 120/85  01/12/17 (!) 159/101  01/09/17 (!) 155/100   OSA on CPAP He reports that when his CPAP setting was adjusted he was having difficulty sleeping when his settings are between 13 to 15 cmH20 He has an upcoming appt with sleep lab He reports continued fatigue in the day time He works at the post [employer]   Chronic Tension-type headache, not intractable headaches are now seemingly more prominent due to his difficulty with fatigue and sleeping He had to take more time off work He feels like the allergy meds make him sleepy as well and cause headaches.    Past Medical History:  Diagnosis Date  . Allergy    allergy shots at [facility]  . Asthma   . Hypertension   . Sleep apnea     Current Outpatient Prescriptions  Medication Sig Dispense Refill  . amLODipine (NORVASC) 10 MG tablet Take 1 tablet (10 mg total) by mouth daily. 90 tablet 3  . fluticasone (FLONASE) 50 MCG/ACT nasal spray Place 2 sprays into both nostrils daily. 16 g 11  . hydrALAZINE (APRESOLINE) 50 MG tablet Take 1 tablet (50 mg total) by mouth 3 (three) times daily. 90 tablet 3  . ipratropium (ATROVENT) 0.06 % nasal spray Place 2  sprays into both nostrils 4 (four) times daily. 15 mL 0  . losartan-hydrochlorothiazide (HYZAAR) 100-25 MG tablet Take 1 tablet by mouth daily. 90 tablet 3  . Multiple Vitamins-Minerals (ADEKS) chewable tablet Chew 1 tablet by mouth daily.    . tadalafil (CIALIS) 5 MG tablet Take 1 tablet (5 mg total) by mouth daily as needed for erectile dysfunction. (Patient not taking: Reported on 01/18/2017) 10 tablet 0  . aspirin EC 81 MG tablet Take 1 tablet (81 mg total) by mouth daily.    . cloNIDine (CATAPRES - DOSED IN MG/24 HR) 0.1 mg/24hr patch Place 1 patch (0.1 mg total) onto the skin once a week. 4 patch 1   No current facility-administered medications for this visit.     Allergies: No Known Allergies  Past Surgical History:  Procedure Laterality Date  . ACHILLES TENDON REPAIR    . NASAL SINUS SURGERY      Social History   Social History  . Marital status: Single    Spouse name: N/A  . Number of children: 0  . Years of education: college   Occupational History  .  [employer]   Social History Main Topics  . Smoking status: Light Tobacco Smoker  . Smokeless tobacco: Never Used     Comment: 3 cig a day   . Alcohol use No  . Drug use: No  . Sexual activity: Yes  Other Topics Concern  . None   Social History Narrative   Marital status: married;       Children: none      Lives: with wife.      Employment: works at [employer]; Solicitorclerk.      Tobacco: none      Alcohol: none      Drugs: none      Exercise: gym 4-5 days per week.   Denies  Caffeine use     ROS See hpi  Objective: Vitals:   01/09/17 1157 01/09/17 1200  BP: (!) 162/93 (!) 155/100  Pulse: 98   Resp: 16   Temp: 98.9 F (37.2 C)   TempSrc: Oral   SpO2: 98%   Weight: 270 lb 12.8 oz (122.8 kg)   Height: 5\' 10"  (1.778 m)     Physical Exam  Constitutional: He is oriented to person, place, and time. He appears well-developed and well-nourished.  HENT:  Head: Normocephalic and atraumatic.    Right Ear: External ear normal.  Left Ear: External ear normal.  Nose: Nose normal.  Mouth/Throat: Oropharynx is clear and moist.  Eyes: Conjunctivae and EOM are normal.  Neck: Normal range of motion. Neck supple.  Cardiovascular: Normal rate, regular rhythm and normal heart sounds.   Pulmonary/Chest: Effort normal and breath sounds normal. No respiratory distress. He has no wheezes.  Neurological: He is alert and oriented to person, place, and time. No cranial nerve deficit.    Assessment and Plan [name] was seen today for hypertension.  Diagnoses and all orders for this visit:  Essential hypertension  Chronic tension-type headache, not intractable-  Continued fatigue, continue prn pain meds   Obstructive sleep apnea on CPAP- added clonidine -     cloNIDine (CATAPRES - DOSED IN MG/24 HR) 0.1 mg/24hr patch; Place 1 patch (0.1 mg total) onto the skin once a week.  Uncontrolled hypertension - bp uncontrolled  Will add clonidine to help with continued bp control     Zakya Halabi A [name]

## 2017-01-12 ENCOUNTER — Encounter: Payer: Self-pay | Admitting: Neurology

## 2017-01-12 ENCOUNTER — Ambulatory Visit (INDEPENDENT_AMBULATORY_CARE_PROVIDER_SITE_OTHER): Payer: Federal, State, Local not specified - PPO | Admitting: Neurology

## 2017-01-12 VITALS — BP 159/101 | HR 73 | Ht 71.0 in | Wt 270.0 lb

## 2017-01-12 DIAGNOSIS — G4733 Obstructive sleep apnea (adult) (pediatric): Secondary | ICD-10-CM

## 2017-01-12 DIAGNOSIS — R030 Elevated blood-pressure reading, without diagnosis of hypertension: Secondary | ICD-10-CM

## 2017-01-12 DIAGNOSIS — Z9989 Dependence on other enabling machines and devices: Secondary | ICD-10-CM

## 2017-01-12 NOTE — Progress Notes (Signed)
Subjective:    Patient ID: Dylan RockersMarty Washington is a 56 y.o. male.  HPI     Interim history:   [name] is a 56 year old right-handed gentleman with an underlying medical history of allergies, asthma, hypertension, obesity, right bundle branch block, and low back pain, who presents for follow-up consultation of his obstructive sleep apnea after recent sleep study testing. The patient is unaccompanied today. I last saw him on 09/13/2016 for reevaluation of his OSA. I ordered a sleep study. He had a split-night sleep study on 10/14/2016. I went over his test results with him in detail today. Sleep efficiency at baseline was 43.6% only, sleep latency was 90 minutes, REM sleep was absent at baseline. Total AHI was 52.8 per hour, average oxygen saturation was 98%, nadir was 76%. He had mild PLMS with no significant arousals. He was fitted with a large full face mask. CPAP was initiated at 5 cm and titrated to 15 cm. On the final pressure, his AHI was 2.6 per hour, nonsupine REM sleep was achieved with O2 nadir of 90%. Based on his test results I prescribed CPAP therapy for home use at a pressure of 15 cm via full facemask. He was noted to have mild PLMS during the second part of the studies well but no associated arousals.  Today, 01/12/2017 (all dictated new, as well as above notes, some dictation done in note pad or Word, outside of chart, may appear as copied):  I reviewed his CPAP compliance data from 12/02/2016 through 12/31/2016 which is a total of 30 days, during which time he used his CPAP every night with percent used days greater than 4 hours of 87%, indicating very good compliance with an average usage of 6 hours and 28 minutes. Residual AHI borderline at 5.6 per hour, leak on the high side for the 95th percentile of 28 L/m on a pressure of 11 cm with EPR of 3. Of note, he has presented to the emergency room recently in March as well as April 2018 for maxillary sinusitis. He also presented to the  emergency room on 12/27/2016 with nasopharyngitis and most recently on 01/02/2017 to emergency room with sinus congestion. He also called in June 2018 to request a pressure change as he was having a difficult time with CPAP. He was changed to AutoPap. I reviewed his AutoPap compliance data from 01/03/2017 through 01/11/2017, during which time he used his AutoPap 100% of the time, average usage of 6 hours and 58 minutes, AHI 5 per hour, leak much improved at 6.4 L/m for the 95th percentile, 95th percentile of pressure at 11.8. He reports doing better with AutoPap. He tolerates it better, his average usage time is better as well and leak is much improved. He reports that once he was aware of the leak, he started tightening his mask a little more. He uses a fullface mask. He reports that his blood pressure started increasing after he started CPAP. I explained to him that using CPAP for sleep apnea will if anything improve his blood pressure with time. He did not take his blood pressure medication this morning, his reading is elevated today but he has no additional symptoms from it. He is willing to continue with AutoPap. He reports that he could not tolerate CPAP of 15 which was the original pressure and we reduced it to 11, nevertheless, he feels he can tolerate AutoPap at this time. He does feel improved in terms of waking up a little better rested and not as  tired during the day.  The patient's allergies, current medications, family history, past medical history, past social history, past surgical history and problem list were reviewed and updated as appropriate.   Previously (copied from previous notes for reference):   09/13/2016: Of note, he no showed for sleep study testing on 05/17/2015 as well as 06/07/2015. He was originally diagnosed with obstructive sleep apnea over 10 years ago. He had sleep study testing in April 2011. This was interpreted by Dr. Maple Hudson. I reviewed his CPAP download from his  compliance card from the machine but the report has another name and date of birth on it. He has used the CPAP 18 out of 30 days in the past month. Average usage of about 6 hours and 15 minutes, has not used CPAP since essentially 09/02/2016. He reports that he cannot use the current mask. He notices air leakage from the mouth and drooling at night, he does not wake up rested. He works for the [employer], works Sunday through Thursday, 11 PM to 7:30 AM, bedtime is around 8 AM. On Friday nights and Saturday nights he is in bed typically around midnight and sleeps till 5 or so but sleep is interrupted, he has nocturia at least 3 times per night on average and has occasional morning headaches. He does not drink alcohol, has reduced his smoking, smokes about 3 cigarettes per day and does not typically have any caffeine on a day-to-day basis. He has gained weight recently. He lives with his girlfriend. I reviewed your office note from 08/18/2016.     10 /04/2015: Mr. Kalafut is a 56 year old right-handed gentleman with an underlying medical history of hypertension, prior smoking until 2012, asthma, allergies, and obesity, who was previously diagnosed with obstructive sleep apnea in 2007. He was originally seen by Dr. Vickey Huger and last seen by her in October 2015. He was on CPAP and was placed on AutoPap therapy at the time. His original sleep study results from 2007 are not available for my review. I reviewed your office note from 04/16/2015. He had another sleep study with CPAP titration on 10/20/09, interpreted by Dr. Maple Hudson, which I reviewed: This was a split-night sleep study. His total AHI at baseline was 17.1 per hour, oxygen desaturation nadir was 68%. He was titrated on CPAP up to 17 cm and his AHI was 0 per hour on that pressure, mean oxygen saturation while on CPAP was 94.5%.  His Past Medical History Is Significant For: Past Medical History:  Diagnosis Date  . Allergy    allergy shots at [facility]  Immunology  . Asthma   . Hypertension   . Sleep apnea     His Past Surgical History Is Significant For: Past Surgical History:  Procedure Laterality Date  . ACHILLES TENDON REPAIR    . NASAL SINUS SURGERY      His Family History Is Significant For: Family History  Problem Relation Age of Onset  . Hypertension Mother   . Cancer Mother   . Hypertension Father     His Social History Is Significant For: Social History   Social History  . Marital status: Single    Spouse name: N/A  . Number of children: 0  . Years of education: college   Occupational History  .  [employer]   Social History Main Topics  . Smoking status: Light Tobacco Smoker  . Smokeless tobacco: Never Used     Comment: 3 cig a day   . Alcohol use No  .  Drug use: No  . Sexual activity: Yes   Other Topics Concern  . None   Social History Narrative   Marital status: married;       Children: none      Lives: with wife.      Employment: works at [employer]; Solicitor.      Tobacco: none      Alcohol: none      Drugs: none      Exercise: gym 4-5 days per week.   Denies  Caffeine use     His Allergies Are:  No Known Allergies:   His Current Medications Are:  Outpatient Encounter Prescriptions as of 01/12/2017  Medication Sig  . amLODipine (NORVASC) 10 MG tablet Take 1 tablet (10 mg total) by mouth daily.  Marland Kitchen aspirin EC 81 MG tablet Take 1 tablet (81 mg total) by mouth daily.  . cloNIDine (CATAPRES - DOSED IN MG/24 HR) 0.1 mg/24hr patch Place 1 patch (0.1 mg total) onto the skin once a week.  . fluticasone (FLONASE) 50 MCG/ACT nasal spray Place 2 sprays into both nostrils daily.  . hydrALAZINE (APRESOLINE) 50 MG tablet Take 1 tablet (50 mg total) by mouth 3 (three) times daily.  Marland Kitchen ipratropium (ATROVENT) 0.06 % nasal spray Place 2 sprays into both nostrils 4 (four) times daily.  Marland Kitchen losartan-hydrochlorothiazide (HYZAAR) 100-25 MG tablet Take 1 tablet by mouth daily.  . Multiple Vitamins-Minerals  (ADEKS) chewable tablet Chew 1 tablet by mouth daily.  . tadalafil (CIALIS) 5 MG tablet Take 1 tablet (5 mg total) by mouth daily as needed for erectile dysfunction.   No facility-administered encounter medications on file as of 01/12/2017.   : Review of Systems:  Out of a complete 14 point review of systems, all are reviewed and negative with the exception of these symptoms as listed below:  Review of Systems  Neurological:       Pt presents today to follow up on his cpap. Pt likes the auto setting much better, but is requesting that the pressures be changed to 5-11 cmwp. He thinks that 11 cmwp and above cause him to have headaches and elevated blood pressure.    Objective:  Neurological Exam  Physical Exam Physical Examination:   Vitals:   01/12/17 0946  BP: (!) 159/101  Pulse: 73   General Examination: The patient is a very pleasant 56 y.o. male in no acute distress. He appears well-developed and well-nourished and well groomed. Good spirits.   HEENT: Normocephalic, atraumatic, pupils are equal, round and reactive to light and accommodation. Extraocular tracking is good without limitation to gaze excursion or nystagmus noted. Normal smooth pursuit is noted. Hearing is grossly intact. Face is symmetric with normal facial animation and normal facial sensation. Speech is clear with no dysarthria noted. There is no hypophonia. There is no lip, neck/head, jaw or voice tremor. Neck is supple with full range of passive and active motion. There are no carotid bruits on auscultation. Oropharynx exam reveals: mild mouth dryness, adequate dental hygiene and moderate airway crowding, due to larger tongue, smaller airway entry, larger uvula. Mild pharyngeal erythema. Mallampati is class II. Tongue protrudes centrally and palate elevates symmetrically. Tonsils are 1+ in size. Neck size is 19 inches. He has a Mild overbite. Nasal inspection reveals no significant nasal mucosal bogginess or redness and no  septal deviation.   Chest: Clear to auscultation without wheezing, rhonchi or crackles noted.  Heart: S1+S2+0, regular and normal without murmurs, rubs or gallops noted.  Abdomen: Soft, non-tender and non-distended with normal bowel sounds appreciated on auscultation.  Extremities: There is no pitting edema in the distal lower extremities bilaterally. Pedal pulses are intact.  Skin: Warm and dry without trophic changes noted.  Musculoskeletal: exam reveals no obvious joint deformities, tenderness or joint swelling or erythema.   Neurologically:  Mental status: The patient is awake, alert and oriented in all 4 spheres. His immediate and remote memory, attention, language skills and fund of knowledge are appropriate. There is no evidence of aphasia, agnosia, apraxia or anomia. Speech is clear with normal prosody and enunciation. Thought process is linear. Mood is normal and affect is normal.  Cranial nerves II - XII are as described above under HEENT exam. In addition: shoulder shrug is normal with equal shoulder height noted. Motor exam: Normal bulk, strength and tone is noted. There is no drift, tremor or rebound. Romberg is negative. Reflexes are 1+ throughout. Fine motor skills and coordination: grossly intact.  Cerebellar testing: No dysmetria or intention tremor. There is no truncal or gait ataxia.  Sensory exam: intact to light touch in the upper and lower extremities.  Gait, station and balance: He stands easily. No veering to one side is noted. No leaning to one side is noted. Posture is age-appropriate and stance is narrow based. Gait shows normal stride length and normal pace. No problems turning are noted. Tandem walk is unremarkable.            Assessment and Plan:  In summary, Dylan Washington is a very pleasant 56 year old male with an underlying medical history of allergies, asthma, hypertension, obesity, right bundle branch block, and low back pain, as well as recent  increase in blood pressure values, who returns for follow-up consultation of his severe OSA. He has a prior diagnosis years ago. He came for a split-night study for reevaluation on 10/14/2016. He was placed on CPAP therapy of 15 cm first but could not tolerate it, I reduced the pressure to 11 cm but still he had a difficult time adjusting to treatment and after we changed his pressure to auto Pap treatment he has done well. He is fully compliant with his AutoPap. Apnea score at 5 per hour at this time, much improved from previously 52.8 per hour. He is sleeping better with it. Average usage of nearly 7 hours. He is reminded to make enough time for sleep, about 7-8 hours on average. He is furthermore advised about his compliance data comparison between his most recent use with AutoPap versus his CPAP of 11 cm previously. He is tolerating the full facemask. As far as his blood pressure elevation, he is strongly advised not to take anymore steroid shots. He received a total of 4 shots every 3 months over the past year. He is furthermore advised to follow-up with his primary care physician, he may need further evaluation with additional blood work and may be a renal ultrasound, renal artery evaluation and so forth, he is advised to discuss this with her. He is also encouraged to be compliant with CPAP as it can with time help reduce his blood pressure. Physical exam is stable otherwise. I suggested a three-month checkup with one of our nurse practitioners to continue to monitor compliance and numbers, and how he is doing, as well as his blood pressure. He is reminded to take his blood pressure regularly and on time. I answered all his questions today and he was in agreement with the plan.  I spent 40 minutes  in total face-to-face time with the patient, more than 50% of which was spent in counseling and coordination of care, reviewing test results, reviewing medication and discussing or reviewing the diagnosis of OSA  and HTN, its prognosis and treatment options. Pertinent laboratory and imaging test results that were available during this visit with the patient were reviewed by me and considered in my medical decision making (see chart for details).

## 2017-01-12 NOTE — Patient Instructions (Addendum)
Please continue using your CPAP regularly. While your insurance requires that you use CPAP at least 4 hours each night on 70% of the nights, I recommend, that you not skip any nights and use it throughout the night if you can. Getting used to CPAP and staying with the treatment long term does take time and patience and discipline. Untreated obstructive sleep apnea when it is moderate to severe can have an adverse impact on cardiovascular health and raise her risk for heart disease, arrhythmias, hypertension, congestive heart failure, stroke and diabetes. Untreated obstructive sleep apnea causes sleep disruption, nonrestorative sleep, and sleep deprivation. This can have an impact on your day to day functioning and cause daytime sleepiness and impairment of cognitive function, memory loss, mood disturbance, and problems focussing. Using CPAP regularly can improve these symptoms.  Please talk to Dr. [name] about additional testing for your high BP: there may be additional blood work and a renal US, that may help.   Make enough time for sleep, average of 7 to 8 hours.   You are fully compliant with your autoPAP at this time, leak better, and apnea score better, from my end of things, you are doing better.   Let's do a 3 month check up with one of our NPs.

## 2017-01-18 ENCOUNTER — Encounter: Payer: Self-pay | Admitting: Family Medicine

## 2017-01-18 ENCOUNTER — Ambulatory Visit (INDEPENDENT_AMBULATORY_CARE_PROVIDER_SITE_OTHER): Payer: Federal, State, Local not specified - PPO | Admitting: Family Medicine

## 2017-01-18 VITALS — BP 120/85 | HR 100 | Temp 98.6°F | Resp 17 | Ht 71.0 in | Wt 262.0 lb

## 2017-01-18 DIAGNOSIS — I10 Essential (primary) hypertension: Secondary | ICD-10-CM | POA: Diagnosis not present

## 2017-01-18 DIAGNOSIS — Z9989 Dependence on other enabling machines and devices: Secondary | ICD-10-CM | POA: Diagnosis not present

## 2017-01-18 DIAGNOSIS — G4733 Obstructive sleep apnea (adult) (pediatric): Secondary | ICD-10-CM

## 2017-01-18 NOTE — Progress Notes (Signed)
Chief Complaint  Patient presents with  . Hypertension    follow up    HPI   Hypertension: Patient here for follow-up of elevated blood pressure. Pt reports that he has fitted his cpap mask and is noticing improved symptoms States that he is sleeping better and working on weight loss Wt Readings from Last 3 Encounters:  01/18/17 262 lb (118.8 kg)  01/12/17 270 lb (122.5 kg)  01/09/17 270 lb 12.8 oz (122.8 kg)    BP Readings from Last 3 Encounters:  01/18/17 120/85  01/12/17 (!) 159/101  01/09/17 (!) 155/100     Past Medical History:  Diagnosis Date  . Allergy    allergy shots at [facility] Immunology  . Asthma   . Hypertension   . Sleep apnea     Current Outpatient Prescriptions  Medication Sig Dispense Refill  . amLODipine (NORVASC) 10 MG tablet Take 1 tablet (10 mg total) by mouth daily. 90 tablet 3  . aspirin EC 81 MG tablet Take 1 tablet (81 mg total) by mouth daily.    . cloNIDine (CATAPRES - DOSED IN MG/24 HR) 0.1 mg/24hr patch Place 1 patch (0.1 mg total) onto the skin once a week. 4 patch 1  . fluticasone (FLONASE) 50 MCG/ACT nasal spray Place 2 sprays into both nostrils daily. 16 g 11  . hydrALAZINE (APRESOLINE) 50 MG tablet Take 1 tablet (50 mg total) by mouth 3 (three) times daily. 90 tablet 3  . ipratropium (ATROVENT) 0.06 % nasal spray Place 2 sprays into both nostrils 4 (four) times daily. 15 mL 0  . losartan-hydrochlorothiazide (HYZAAR) 100-25 MG tablet Take 1 tablet by mouth daily. 90 tablet 3  . Multiple Vitamins-Minerals (ADEKS) chewable tablet Chew 1 tablet by mouth daily.    . tadalafil (CIALIS) 5 MG tablet Take 1 tablet (5 mg total) by mouth daily as needed for erectile dysfunction. (Patient not taking: Reported on 01/18/2017) 10 tablet 0   No current facility-administered medications for this visit.     Allergies: No Known Allergies  Past Surgical History:  Procedure Laterality Date  . ACHILLES TENDON REPAIR    . NASAL SINUS SURGERY       Social History   Social History  . Marital status: Single    Spouse name: N/A  . Number of children: 0  . Years of education: college   Occupational History  .  [employer]   Social History Main Topics  . Smoking status: Light Tobacco Smoker  . Smokeless tobacco: Never Used     Comment: 3 cig a day   . Alcohol use No  . Drug use: No  . Sexual activity: Yes   Other Topics Concern  . None   Social History Narrative   Marital status: married;       Children: none      Lives: with wife.      Employment: works at [employer]; Solicitor.      Tobacco: none      Alcohol: none      Drugs: none      Exercise: gym 4-5 days per week.   Denies  Caffeine use     ROS  Objective: Vitals:   01/18/17 1657 01/18/17 1659  BP: (!) 146/94 120/85  Pulse: 100   Resp: 17   Temp: 98.6 F (37 C)   TempSrc: Oral   SpO2: 96%   Weight: 262 lb (118.8 kg)   Height: 5\' 11"  (1.803 m)     Physical  Exam General: alert, oriented, in NAD Heart: normal rate, normal sinus rhythm, no murmurs Lungs: clear to auscultation bilaterally, no wheezing   Assessment and Plan Dylan Washington was seen today for hypertension.  Diagnoses and all orders for this visit:  Essential hypertension Obstructive sleep apnea on CPAP  -  bp improved with improved with hydralazine tid increased from bid Also with autopap -   3 months follow up for bp monitoring  Sonam Huelsmann A [name]

## 2017-01-18 NOTE — Patient Instructions (Addendum)
   IF you received an x-ray today, you will receive an invoice from [facility]. Please contact [facility] at [phone] with questions or concerns regarding your invoice.   IF you received labwork today, you will receive an invoice from [facility]. Please contact [facility] at [phone] with questions or concerns regarding your invoice.   Our billing staff will not be able to assist you with questions regarding bills from these companies.  You will be contacted with the lab results as soon as they are available. The fastest way to get your results is to activate your My Chart account. Instructions are located on the last page of this paperwork. If you have not heard from us regarding the results in 2 weeks, please contact this office.     Hypertension Hypertension, commonly called high blood pressure, is when the force of blood pumping through the arteries is too strong. The arteries are the blood vessels that carry blood from the heart throughout the body. Hypertension forces the heart to work harder to pump blood and may cause arteries to become narrow or stiff. Having untreated or uncontrolled hypertension can cause heart attacks, strokes, kidney disease, and other problems. A blood pressure reading consists of a higher number over a lower number. Ideally, your blood pressure should be below 120/80. The first ("top") number is called the systolic pressure. It is a measure of the pressure in your arteries as your heart beats. The second ("bottom") number is called the diastolic pressure. It is a measure of the pressure in your arteries as the heart relaxes. What are the causes? The cause of this condition is not known. What increases the risk? Some risk factors for high blood pressure are under your control. Others are not. Factors you can change  Smoking.  Having type 2 diabetes mellitus, high cholesterol, or both.  Not getting enough exercise or physical  activity.  Being overweight.  Having too much fat, sugar, calories, or salt (sodium) in your diet.  Drinking too much alcohol. Factors that are difficult or impossible to change  Having chronic kidney disease.  Having a family history of high blood pressure.  Age. Risk increases with age.  Race. You may be at higher risk if you are African-American.  Gender. Men are at higher risk than women before age 45. After age 65, women are at higher risk than men.  Having obstructive sleep apnea.  Stress. What are the signs or symptoms? Extremely high blood pressure (hypertensive crisis) may cause:  Headache.  Anxiety.  Shortness of breath.  Nosebleed.  Nausea and vomiting.  Severe chest pain.  Jerky movements you cannot control (seizures).  How is this diagnosed? This condition is diagnosed by measuring your blood pressure while you are seated, with your arm resting on a surface. The cuff of the blood pressure monitor will be placed directly against the skin of your upper arm at the level of your heart. It should be measured at least twice using the same arm. Certain conditions can cause a difference in blood pressure between your right and left arms. Certain factors can cause blood pressure readings to be lower or higher than normal (elevated) for a short period of time:  When your blood pressure is higher when you are in a health care provider's office than when you are at home, this is called white coat hypertension. Most people with this condition do not need medicines.  When your blood pressure is higher at home than when you   are in a health care provider's office, this is called masked hypertension. Most people with this condition may need medicines to control blood pressure.  If you have a high blood pressure reading during one visit or you have normal blood pressure with other risk factors:  You may be asked to return on a different day to have your blood pressure  checked again.  You may be asked to monitor your blood pressure at home for 1 week or longer.  If you are diagnosed with hypertension, you may have other blood or imaging tests to help your health care provider understand your overall risk for other conditions. How is this treated? This condition is treated by making healthy lifestyle changes, such as eating healthy foods, exercising more, and reducing your alcohol intake. Your health care provider may prescribe medicine if lifestyle changes are not enough to get your blood pressure under control, and if:  Your systolic blood pressure is above 130.  Your diastolic blood pressure is above 80.  Your personal target blood pressure may vary depending on your medical conditions, your age, and other factors. Follow these instructions at home: Eating and drinking  Eat a diet that is high in fiber and potassium, and low in sodium, added sugar, and fat. An example eating plan is called the DASH (Dietary Approaches to Stop Hypertension) diet. To eat this way: ? Eat plenty of fresh fruits and vegetables. Try to fill half of your plate at each meal with fruits and vegetables. ? Eat whole grains, such as whole wheat pasta, brown rice, or whole grain bread. Fill about one quarter of your plate with whole grains. ? Eat or drink low-fat dairy products, such as skim milk or low-fat yogurt. ? Avoid fatty cuts of meat, processed or cured meats, and poultry with skin. Fill about one quarter of your plate with lean proteins, such as fish, chicken without skin, beans, eggs, and tofu. ? Avoid premade and processed foods. These tend to be higher in sodium, added sugar, and fat.  Reduce your daily sodium intake. Most people with hypertension should eat less than 1,500 mg of sodium a day.  Limit alcohol intake to no more than 1 drink a day for nonpregnant women and 2 drinks a day for men. One drink equals 12 oz of beer, 5 oz of wine, or 1 oz of hard  liquor. Lifestyle  Work with your health care provider to maintain a healthy body weight or to lose weight. Ask what an ideal weight is for you.  Get at least 30 minutes of exercise that causes your heart to beat faster (aerobic exercise) most days of the week. Activities may include walking, swimming, or biking.  Include exercise to strengthen your muscles (resistance exercise), such as pilates or lifting weights, as part of your weekly exercise routine. Try to do these types of exercises for 30 minutes at least 3 days a week.  Do not use any products that contain nicotine or tobacco, such as cigarettes and e-cigarettes. If you need help quitting, ask your health care provider.  Monitor your blood pressure at home as told by your health care provider.  Keep all follow-up visits as told by your health care provider. This is important. Medicines  Take over-the-counter and prescription medicines only as told by your health care provider. Follow directions carefully. Blood pressure medicines must be taken as prescribed.  Do not skip doses of blood pressure medicine. Doing this puts you at risk for problems and   can make the medicine less effective.  Ask your health care provider about side effects or reactions to medicines that you should watch for. Contact a health care provider if:  You think you are having a reaction to a medicine you are taking.  You have headaches that keep coming back (recurring).  You feel dizzy.  You have swelling in your ankles.  You have trouble with your vision. Get help right away if:  You develop a severe headache or confusion.  You have unusual weakness or numbness.  You feel faint.  You have severe pain in your chest or abdomen.  You vomit repeatedly.  You have trouble breathing. Summary  Hypertension is when the force of blood pumping through your arteries is too strong. If this condition is not controlled, it may put you at risk for serious  complications.  Your personal target blood pressure may vary depending on your medical conditions, your age, and other factors. For most people, a normal blood pressure is less than 120/80.  Hypertension is treated with lifestyle changes, medicines, or a combination of both. Lifestyle changes include weight loss, eating a healthy, low-sodium diet, exercising more, and limiting alcohol. This information is not intended to replace advice given to you by your health care provider. Make sure you discuss any questions you have with your health care provider. Document Released: 06/27/2005 Document Revised: 05/25/2016 Document Reviewed: 05/25/2016 Elsevier Interactive Patient Education  2018 Elsevier Inc.  

## 2017-01-24 DIAGNOSIS — J0100 Acute maxillary sinusitis, unspecified: Secondary | ICD-10-CM | POA: Diagnosis not present

## 2017-01-31 DIAGNOSIS — G4733 Obstructive sleep apnea (adult) (pediatric): Secondary | ICD-10-CM | POA: Diagnosis not present

## 2017-02-01 ENCOUNTER — Ambulatory Visit (INDEPENDENT_AMBULATORY_CARE_PROVIDER_SITE_OTHER): Payer: Federal, State, Local not specified - PPO | Admitting: Family Medicine

## 2017-02-01 ENCOUNTER — Encounter: Payer: Self-pay | Admitting: Family Medicine

## 2017-02-01 VITALS — BP 139/95 | HR 92 | Temp 98.2°F | Resp 16 | Ht 71.0 in | Wt 261.8 lb

## 2017-02-01 DIAGNOSIS — I10 Essential (primary) hypertension: Secondary | ICD-10-CM

## 2017-02-01 DIAGNOSIS — G4733 Obstructive sleep apnea (adult) (pediatric): Secondary | ICD-10-CM

## 2017-02-01 DIAGNOSIS — Z9989 Dependence on other enabling machines and devices: Secondary | ICD-10-CM

## 2017-02-01 NOTE — Patient Instructions (Addendum)
   IF you received an x-ray today, you will receive an invoice from [facility]. Please contact [facility] at [phone] with questions or concerns regarding your invoice.   IF you received labwork today, you will receive an invoice from [facility]. Please contact [facility] at [phone] with questions or concerns regarding your invoice.   Our billing staff will not be able to assist you with questions regarding bills from these companies.  You will be contacted with the lab results as soon as they are available. The fastest way to get your results is to activate your My Chart account. Instructions are located on the last page of this paperwork. If you have not heard from us regarding the results in 2 weeks, please contact this office.     Hypertension Hypertension, commonly called high blood pressure, is when the force of blood pumping through the arteries is too strong. The arteries are the blood vessels that carry blood from the heart throughout the body. Hypertension forces the heart to work harder to pump blood and may cause arteries to become narrow or stiff. Having untreated or uncontrolled hypertension can cause heart attacks, strokes, kidney disease, and other problems. A blood pressure reading consists of a higher number over a lower number. Ideally, your blood pressure should be below 120/80. The first ("top") number is called the systolic pressure. It is a measure of the pressure in your arteries as your heart beats. The second ("bottom") number is called the diastolic pressure. It is a measure of the pressure in your arteries as the heart relaxes. What are the causes? The cause of this condition is not known. What increases the risk? Some risk factors for high blood pressure are under your control. Others are not. Factors you can change  Smoking.  Having type 2 diabetes mellitus, high cholesterol, or both.  Not getting enough exercise or physical  activity.  Being overweight.  Having too much fat, sugar, calories, or salt (sodium) in your diet.  Drinking too much alcohol. Factors that are difficult or impossible to change  Having chronic kidney disease.  Having a family history of high blood pressure.  Age. Risk increases with age.  Race. You may be at higher risk if you are African-American.  Gender. Men are at higher risk than women before age 45. After age 65, women are at higher risk than men.  Having obstructive sleep apnea.  Stress. What are the signs or symptoms? Extremely high blood pressure (hypertensive crisis) may cause:  Headache.  Anxiety.  Shortness of breath.  Nosebleed.  Nausea and vomiting.  Severe chest pain.  Jerky movements you cannot control (seizures).  How is this diagnosed? This condition is diagnosed by measuring your blood pressure while you are seated, with your arm resting on a surface. The cuff of the blood pressure monitor will be placed directly against the skin of your upper arm at the level of your heart. It should be measured at least twice using the same arm. Certain conditions can cause a difference in blood pressure between your right and left arms. Certain factors can cause blood pressure readings to be lower or higher than normal (elevated) for a short period of time:  When your blood pressure is higher when you are in a health care provider's office than when you are at home, this is called white coat hypertension. Most people with this condition do not need medicines.  When your blood pressure is higher at home than when you   are in a health care provider's office, this is called masked hypertension. Most people with this condition may need medicines to control blood pressure.  If you have a high blood pressure reading during one visit or you have normal blood pressure with other risk factors:  You may be asked to return on a different day to have your blood pressure  checked again.  You may be asked to monitor your blood pressure at home for 1 week or longer.  If you are diagnosed with hypertension, you may have other blood or imaging tests to help your health care provider understand your overall risk for other conditions. How is this treated? This condition is treated by making healthy lifestyle changes, such as eating healthy foods, exercising more, and reducing your alcohol intake. Your health care provider may prescribe medicine if lifestyle changes are not enough to get your blood pressure under control, and if:  Your systolic blood pressure is above 130.  Your diastolic blood pressure is above 80.  Your personal target blood pressure may vary depending on your medical conditions, your age, and other factors. Follow these instructions at home: Eating and drinking  Eat a diet that is high in fiber and potassium, and low in sodium, added sugar, and fat. An example eating plan is called the DASH (Dietary Approaches to Stop Hypertension) diet. To eat this way: ? Eat plenty of fresh fruits and vegetables. Try to fill half of your plate at each meal with fruits and vegetables. ? Eat whole grains, such as whole wheat pasta, brown rice, or whole grain bread. Fill about one quarter of your plate with whole grains. ? Eat or drink low-fat dairy products, such as skim milk or low-fat yogurt. ? Avoid fatty cuts of meat, processed or cured meats, and poultry with skin. Fill about one quarter of your plate with lean proteins, such as fish, chicken without skin, beans, eggs, and tofu. ? Avoid premade and processed foods. These tend to be higher in sodium, added sugar, and fat.  Reduce your daily sodium intake. Most people with hypertension should eat less than 1,500 mg of sodium a day.  Limit alcohol intake to no more than 1 drink a day for nonpregnant women and 2 drinks a day for men. One drink equals 12 oz of beer, 5 oz of wine, or 1 oz of hard  liquor. Lifestyle  Work with your health care provider to maintain a healthy body weight or to lose weight. Ask what an ideal weight is for you.  Get at least 30 minutes of exercise that causes your heart to beat faster (aerobic exercise) most days of the week. Activities may include walking, swimming, or biking.  Include exercise to strengthen your muscles (resistance exercise), such as pilates or lifting weights, as part of your weekly exercise routine. Try to do these types of exercises for 30 minutes at least 3 days a week.  Do not use any products that contain nicotine or tobacco, such as cigarettes and e-cigarettes. If you need help quitting, ask your health care provider.  Monitor your blood pressure at home as told by your health care provider.  Keep all follow-up visits as told by your health care provider. This is important. Medicines  Take over-the-counter and prescription medicines only as told by your health care provider. Follow directions carefully. Blood pressure medicines must be taken as prescribed.  Do not skip doses of blood pressure medicine. Doing this puts you at risk for problems and   can make the medicine less effective.  Ask your health care provider about side effects or reactions to medicines that you should watch for. Contact a health care provider if:  You think you are having a reaction to a medicine you are taking.  You have headaches that keep coming back (recurring).  You feel dizzy.  You have swelling in your ankles.  You have trouble with your vision. Get help right away if:  You develop a severe headache or confusion.  You have unusual weakness or numbness.  You feel faint.  You have severe pain in your chest or abdomen.  You vomit repeatedly.  You have trouble breathing. Summary  Hypertension is when the force of blood pumping through your arteries is too strong. If this condition is not controlled, it may put you at risk for serious  complications.  Your personal target blood pressure may vary depending on your medical conditions, your age, and other factors. For most people, a normal blood pressure is less than 120/80.  Hypertension is treated with lifestyle changes, medicines, or a combination of both. Lifestyle changes include weight loss, eating a healthy, low-sodium diet, exercising more, and limiting alcohol. This information is not intended to replace advice given to you by your health care provider. Make sure you discuss any questions you have with your health care provider. Document Released: 06/27/2005 Document Revised: 05/25/2016 Document Reviewed: 05/25/2016 Elsevier Interactive Patient Education  2018 Elsevier Inc.  

## 2017-02-01 NOTE — Progress Notes (Signed)
Chief Complaint  Patient presents with  . Hypertension    follow up    HPI   Hypertension: Patient here for follow-up of elevated blood pressure. He is exercising and is adherent to low salt diet.  Blood pressure is improving and almost well controlled at home. Cardiac symptoms none. Patient denies chest pain, claudication, dyspnea, fatigue, irregular heart beat and lower extremity edema.  Cardiovascular risk factors: dyslipidemia, hypertension, male gender and obesity (BMI >= 30 kg/m2). Use of agents associated with hypertension: none. He also has sleep apnea which is now being properly treated.  BP Readings from Last 3 Encounters:  02/01/17 (!) 139/95  01/18/17 120/85  01/12/17 (!) 159/101     Past Medical History:  Diagnosis Date  . Allergy    [facility]ergy shots at [facility]  . Asthma   . Hypertension   . Sleep apnea     Current Outpatient Prescriptions  Medication Sig Dispense Refill  . amLODipine (NORVASC) 10 MG tablet Take 1 tablet (10 mg total) by mouth daily. 90 tablet 3  . aspirin EC 81 MG tablet Take 1 tablet (81 mg total) by mouth daily.    . cloNIDine (CATAPRES - DOSED IN MG/24 HR) 0.1 mg/24hr patch Place 1 patch (0.1 mg total) onto the skin once a week. 4 patch 1  . fluticasone (FLONASE) 50 MCG/ACT nasal spray Place 2 sprays into both nostrils daily. 16 g 11  . hydrALAZINE (APRESOLINE) 50 MG tablet Take 1 tablet (50 mg total) by mouth 3 (three) times daily. 90 tablet 3  . ipratropium (ATROVENT) 0.06 % nasal spray Place 2 sprays into both nostrils 4 (four) times daily. 15 mL 0  . losartan-hydrochlorothiazide (HYZAAR) 100-25 MG tablet Take 1 tablet by mouth daily. 90 tablet 3  . Multiple Vitamins-Minerals (ADEKS) chewable tablet Chew 1 tablet by mouth daily.    . tadalafil (CIALIS) 5 MG tablet Take 1 tablet (5 mg total) by mouth daily as needed for erectile dysfunction. (Patient not taking: Reported on 01/18/2017) 10 tablet 0   No current facility-administered  medications for this visit.     Allergies: No Known Allergies  Past Surgical History:  Procedure Laterality Date  . ACHILLES TENDON REPAIR    . NASAL SINUS SURGERY      Social History   Social History  . Marital status: Single    Spouse name: N/A  . Number of children: 0  . Years of education: college   Occupational History  .  [employer]   Social History Main Topics  . Smoking status: Light Tobacco Smoker  . Smokeless tobacco: Never Used     Comment: 3 cig a day   . Alcohol use No  . Drug use: No  . Sexual activity: Yes   Other Topics Concern  . None   Social History Narrative   Marital status: married;       Children: none      Lives: with wife.      Employment: works at [employer]; Solicitorclerk.      Tobacco: none      Alcohol: none      Drugs: none      Exercise: gym 4-5 days per week.   Denies  Caffeine use     ROS See hpi Review of Systems See HPI Constitution: No fevers or chills No malaise No diaphoresis Skin: No rash or itching Eyes: no blurry vision, no double vision GU: no dysuria or hematuria Neuro: no dizziness or headaches  Objective: Vitals:   02/01/17 1644 02/01/17 1650  BP: (!) 154/101 (!) 139/95  Pulse: 94 92  Resp: 16   Temp: 98.2 F (36.8 C)   TempSrc: Oral   SpO2: 98%   Weight: 261 lb 12.8 oz (118.8 kg)   Height: 5\' 11"  (1.803 m)     Physical Exam  Constitutional: He is oriented to person, place, and time. He appears well-developed and well-nourished.  HENT:  Head: Normocephalic and atraumatic.  Cardiovascular: Normal rate, regular rhythm and normal heart sounds.   Pulmonary/Chest: Effort normal and breath sounds normal. No respiratory distress. He has no wheezes.  Neurological: He is alert and oriented to person, place, and time.  Skin: Skin is warm. Capillary refill takes less than 2 seconds.    Assessment and Plan [name] was seen today for hypertension.  Diagnoses and all orders for this visit:  Essential  hypertension- bp much improved  Obstructive sleep apnea on CPAP- continue cpap and follow up with sleep clinic as instructed     Sherrie Marsan A [name]

## 2017-02-16 DIAGNOSIS — J0101 Acute recurrent maxillary sinusitis: Secondary | ICD-10-CM | POA: Diagnosis not present

## 2017-02-23 DIAGNOSIS — G4709 Other insomnia: Secondary | ICD-10-CM | POA: Diagnosis not present

## 2017-02-23 DIAGNOSIS — F411 Generalized anxiety disorder: Secondary | ICD-10-CM | POA: Diagnosis not present

## 2017-03-06 ENCOUNTER — Other Ambulatory Visit: Payer: Self-pay | Admitting: Family Medicine

## 2017-03-06 DIAGNOSIS — Z9989 Dependence on other enabling machines and devices: Secondary | ICD-10-CM

## 2017-03-06 DIAGNOSIS — G4733 Obstructive sleep apnea (adult) (pediatric): Secondary | ICD-10-CM

## 2017-03-07 DIAGNOSIS — G4733 Obstructive sleep apnea (adult) (pediatric): Secondary | ICD-10-CM | POA: Diagnosis not present

## 2017-03-07 NOTE — Telephone Encounter (Signed)
Refill request for clonidine 0.1 mg/24 hr patch approved with 1 refill.  Will send to schedulers to schedule f/u htn appt with Creta Levin in October.

## 2017-04-14 ENCOUNTER — Ambulatory Visit (INDEPENDENT_AMBULATORY_CARE_PROVIDER_SITE_OTHER): Payer: Federal, State, Local not specified - PPO | Admitting: Emergency Medicine

## 2017-04-14 ENCOUNTER — Encounter: Payer: Self-pay | Admitting: Emergency Medicine

## 2017-04-14 VITALS — BP 120/76 | HR 93 | Temp 98.1°F | Resp 16 | Ht 70.0 in | Wt 261.2 lb

## 2017-04-14 DIAGNOSIS — M545 Low back pain, unspecified: Secondary | ICD-10-CM | POA: Insufficient documentation

## 2017-04-14 DIAGNOSIS — S39012A Strain of muscle, fascia and tendon of lower back, initial encounter: Secondary | ICD-10-CM

## 2017-04-14 MED ORDER — CYCLOBENZAPRINE HCL 10 MG PO TABS: 10.0000 mg | ORAL_TABLET | Freq: Three times a day (TID) | ORAL | 0 refills | Status: DC | PRN

## 2017-04-14 MED ORDER — DICLOFENAC SODIUM 75 MG PO TBEC: 75.0000 mg | DELAYED_RELEASE_TABLET | Freq: Two times a day (BID) | ORAL | 0 refills | Status: AC

## 2017-04-14 NOTE — Progress Notes (Signed)
Dylan Washington 56 y.o.   Chief Complaint  Patient presents with  . Back Pain    lower x 2 days    HISTORY OF PRESENT ILLNESS: This is a 56 y.o. male complaining of low back pain since injury yesterday.  Back Pain  This is a new problem. The current episode started yesterday. The problem occurs constantly. The problem has been waxing and waning since onset. The pain is present in the lumbar spine. The quality of the pain is described as aching. The pain does not radiate. The pain is at a severity of 7/10. The pain is moderate. The pain is the same all the time. The symptoms are aggravated by bending and position. Pertinent negatives include no abdominal pain, bladder incontinence, bowel incontinence, chest pain, dysuria, fever, leg pain, numbness, paresthesias, pelvic pain, perianal numbness, tingling, weakness or weight loss. Risk factors: none. He has tried nothing for the symptoms.     Prior to Admission medications   Medication Sig Start Date End Date Taking? Authorizing Provider  amLODipine (NORVASC) 10 MG tablet Take 1 tablet (10 mg total) by mouth daily. 12/22/16  Yes Stallings, Zoe A, MD  cloNIDine (CATAPRES - DOSED IN MG/24 HR) 0.1 mg/24hr patch APPLY 1 PATCH(0.1 MG) EXTERNALLY TO THE SKIN 1 TIME A WEEK 03/07/17  Yes Stallings, Zoe A, MD  fluticasone (FLONASE) 50 MCG/ACT nasal spray Place 2 sprays into both nostrils daily. 10/22/16  Yes Elvina Sidle, MD  losartan-hydrochlorothiazide (HYZAAR) 100-25 MG tablet Take 1 tablet by mouth daily. 07/20/16  Yes Praise Stennett, Eilleen Kempf, MD  Multiple Vitamins-Minerals (ADEKS) chewable tablet Chew 1 tablet by mouth daily.   Yes [provider]  aspirin EC 81 MG tablet Take 1 tablet (81 mg total) by mouth daily. Patient not taking: Reported on 04/14/2017 12/22/16   Doristine Bosworth, MD  cyclobenzaprine (FLEXERIL) 10 MG tablet Take 1 tablet (10 mg total) by mouth 3 (three) times daily as needed for muscle spasms. 04/14/17   Georgina Quint, MD  diclofenac (VOLTAREN) 75 MG EC tablet Take 1 tablet (75 mg total) by mouth 2 (two) times daily. 04/14/17 04/19/17  Georgina Quint, MD  hydrALAZINE (APRESOLINE) 50 MG tablet Take 1 tablet (50 mg total) by mouth 3 (three) times daily. 12/22/16 01/21/17  Doristine Bosworth, MD  ipratropium (ATROVENT) 0.06 % nasal spray Place 2 sprays into both nostrils 4 (four) times daily. Patient not taking: Reported on 04/14/2017 12/27/16   Deatra Canter, FNP  tadalafil (CIALIS) 5 MG tablet Take 1 tablet (5 mg total) by mouth daily as needed for erectile dysfunction. Patient not taking: Reported on 04/14/2017 11/22/16   Doristine Bosworth, MD    No Known Allergies  Patient Active Problem List   Diagnosis Date Noted  . Strain of lumbar region 04/14/2017  . Acute midline low back pain without sciatica 04/14/2017  . Chronic tension-type headache, not intractable 11/14/2016  . Bone spur of foot 02/08/2016  . RBBB (right bundle branch block) 04/02/2014  . Asthma 01/17/2012  . Environmental allergies 08/16/2011  . Hypertension   . Sleep apnea     Past Medical History:  Diagnosis Date  . Allergy    allergy shots at [facility]  . Asthma   . Hypertension   . Sleep apnea     Past Surgical History:  Procedure Laterality Date  . ACHILLES TENDON REPAIR    . NASAL SINUS SURGERY      Social History   Social History  .  Marital status: Single    Spouse name: N/A  . Number of children: 0  . Years of education: college   Occupational History  .  [employer]   Social History Main Topics  . Smoking status: Light Tobacco Smoker  . Smokeless tobacco: Never Used     Comment: 3 cig a day   . Alcohol use No  . Drug use: No  . Sexual activity: Yes   Other Topics Concern  . Not on file   Social History Narrative   Marital status: married;       Children: none      Lives: with wife.      Employment: works at [employer]; Solicitor.      Tobacco: none      Alcohol: none       Drugs: none      Exercise: gym 4-5 days per week.   Denies  Caffeine use     Family History  Problem Relation Age of Onset  . Hypertension Mother   . Cancer Mother   . Hypertension Father      Review of Systems  Constitutional: Negative.  Negative for fever and weight loss.  Cardiovascular: Negative for chest pain and leg swelling.  Gastrointestinal: Negative for abdominal pain, bowel incontinence, diarrhea, nausea and vomiting.  Genitourinary: Negative for bladder incontinence, dysuria, flank pain, hematuria and pelvic pain.  Musculoskeletal: Positive for back pain.  Skin: Negative.  Negative for rash.  Neurological: Negative for dizziness, tingling, sensory change, focal weakness, weakness, numbness and paresthesias.  Endo/Heme/Allergies: Negative.   All other systems reviewed and are negative.  Vitals:   04/14/17 1615  BP: 120/76  Pulse: 93  Resp: 16  Temp: 98.1 F (36.7 C)  SpO2: 98%     Physical Exam  Constitutional: He is oriented to person, place, and time. He appears well-developed and well-nourished.  HENT:  Head: Normocephalic and atraumatic.  Eyes: Pupils are equal, round, and reactive to light. Conjunctivae and EOM are normal.  Neck: Normal range of motion. Neck supple.  Cardiovascular: Normal rate, regular rhythm and normal heart sounds.   Pulmonary/Chest: Effort normal and breath sounds normal.  Abdominal: Soft. Bowel sounds are normal. He exhibits no distension. There is no tenderness.  Musculoskeletal:       Lumbar back: He exhibits decreased range of motion, tenderness and spasm. He exhibits no bony tenderness, no pain and normal pulse.  Neurological: He is alert and oriented to person, place, and time. He displays normal reflexes. No sensory deficit. He exhibits normal muscle tone.  Skin: Skin is warm and dry. Capillary refill takes less than 2 seconds.  Psychiatric: He has a normal mood and affect. His behavior is normal.  Vitals  reviewed.    ASSESSMENT & PLAN: Dylan Washington was seen today for back pain.  Diagnoses and all orders for this visit:  Strain of lumbar region, initial encounter  Acute midline low back pain without sciatica  Other orders -     cyclobenzaprine (FLEXERIL) 10 MG tablet; Take 1 tablet (10 mg total) by mouth 3 (three) times daily as needed for muscle spasms. -     diclofenac (VOLTAREN) 75 MG EC tablet; Take 1 tablet (75 mg total) by mouth 2 (two) times daily.    Patient Instructions       IF you received an x-ray today, you will receive an invoice from [facility]. Please contact [facility] at [phone] with questions or concerns regarding your invoice.  IF you received labwork today, you will receive an invoice from [facility]. Please contact [facility] at [phone] with questions or concerns regarding your invoice.   Our billing staff will not be able to assist you with questions regarding bills from these companies.  You will be contacted with the lab results as soon as they are available. The fastest way to get your results is to activate your My Chart account. Instructions are located on the last page of this paperwork. If you have not heard from [place] regarding the results in 2 weeks, please contact this office.     Back Pain, Adult Back pain is very common. The pain often gets better over time. The cause of back pain is usually not dangerous. Most people can learn to manage their back pain on their own. Follow these instructions at home: Watch your back pain for any changes. The following actions may help to lessen any pain you are feeling:  Stay active. Start with short walks on flat ground if you can. Try to walk farther each day.  Exercise regularly as told by your doctor. Exercise helps your back heal faster. It also helps avoid future injury by keeping your muscles strong and flexible.  Do not sit, drive, or stand in one place for more than 30  minutes.  Do not stay in bed. Resting more than 1-2 days can slow down your recovery.  Be careful when you bend or lift an object. Use good form when lifting: ? Bend at your knees. ? Keep the object close to your body. ? Do not twist.  Sleep on a firm mattress. Lie on your side, and bend your knees. If you lie on your back, put a pillow under your knees.  Take medicines only as told by your doctor.  Put ice on the injured area. ? Put ice in a plastic bag. ? Place a towel between your skin and the bag. ? Leave the ice on for 20 minutes, 2-3 times a day for the first 2-3 days. After that, you can switch between ice and heat packs.  Avoid feeling anxious or stressed. Find good ways to deal with stress, such as exercise.  Maintain a healthy weight. Extra weight puts stress on your back.  Contact a doctor if:  You have pain that does not go away with rest or medicine.  You have worsening pain that goes down into your legs or buttocks.  You have pain that does not get better in one week.  You have pain at night.  You lose weight.  You have a fever or chills. Get help right away if:  You cannot control when you poop (bowel movement) or pee (urinate).  Your arms or legs feel weak.  Your arms or legs lose feeling (numbness).  You feel sick to your stomach (nauseous) or throw up (vomit).  You have belly (abdominal) pain.  You feel like you may pass out (faint). This information is not intended to replace advice given to you by your health care provider. Make sure you discuss any questions you have with your health care provider. Document Released: 12/14/2007 Document Revised: 12/03/2015 Document Reviewed: 10/29/2013 Elsevier Interactive Patient Education  2018 Elsevier Inc.      Edwina Barth, MD [facility]

## 2017-04-14 NOTE — Patient Instructions (Addendum)
     IF you received an x-ray today, you will receive an invoice from [facility]. Please contact [facility] at [phone] with questions or concerns regarding your invoice.   IF you received labwork today, you will receive an invoice from [facility]. Please contact [facility] at [phone] with questions or concerns regarding your invoice.   Our billing staff will not be able to assist you with questions regarding bills from these companies.  You will be contacted with the lab results as soon as they are available. The fastest way to get your results is to activate your My Chart account. Instructions are located on the last page of this paperwork. If you have not heard from us regarding the results in 2 weeks, please contact this office.      Back Pain, Adult Back pain is very common. The pain often gets better over time. The cause of back pain is usually not dangerous. Most people can learn to manage their back pain on their own. Follow these instructions at home: Watch your back pain for any changes. The following actions may help to lessen any pain you are feeling:  Stay active. Start with short walks on flat ground if you can. Try to walk farther each day.  Exercise regularly as told by your doctor. Exercise helps your back heal faster. It also helps avoid future injury by keeping your muscles strong and flexible.  Do not sit, drive, or stand in one place for more than 30 minutes.  Do not stay in bed. Resting more than 1-2 days can slow down your recovery.  Be careful when you bend or lift an object. Use good form when lifting: ? Bend at your knees. ? Keep the object close to your body. ? Do not twist.  Sleep on a firm mattress. Lie on your side, and bend your knees. If you lie on your back, put a pillow under your knees.  Take medicines only as told by your doctor.  Put ice on the injured area. ? Put ice in a plastic bag. ? Place a towel between your  skin and the bag. ? Leave the ice on for 20 minutes, 2-3 times a day for the first 2-3 days. After that, you can switch between ice and heat packs.  Avoid feeling anxious or stressed. Find good ways to deal with stress, such as exercise.  Maintain a healthy weight. Extra weight puts stress on your back.  Contact a doctor if:  You have pain that does not go away with rest or medicine.  You have worsening pain that goes down into your legs or buttocks.  You have pain that does not get better in one week.  You have pain at night.  You lose weight.  You have a fever or chills. Get help right away if:  You cannot control when you poop (bowel movement) or pee (urinate).  Your arms or legs feel weak.  Your arms or legs lose feeling (numbness).  You feel sick to your stomach (nauseous) or throw up (vomit).  You have belly (abdominal) pain.  You feel like you may pass out (faint). This information is not intended to replace advice given to you by your health care provider. Make sure you discuss any questions you have with your health care provider. Document Released: 12/14/2007 Document Revised: 12/03/2015 Document Reviewed: 10/29/2013 Elsevier Interactive Patient Education  2018 Elsevier Inc.  

## 2017-04-19 ENCOUNTER — Ambulatory Visit: Payer: Federal, State, Local not specified - PPO | Admitting: Family Medicine

## 2017-04-21 ENCOUNTER — Ambulatory Visit: Payer: Federal, State, Local not specified - PPO | Admitting: Nurse Practitioner

## 2017-04-24 ENCOUNTER — Ambulatory Visit (INDEPENDENT_AMBULATORY_CARE_PROVIDER_SITE_OTHER): Payer: Federal, State, Local not specified - PPO | Admitting: Family Medicine

## 2017-04-24 ENCOUNTER — Encounter: Payer: Self-pay | Admitting: Family Medicine

## 2017-04-24 VITALS — BP 140/90 | HR 96 | Temp 98.9°F | Resp 16 | Ht 70.0 in | Wt 263.6 lb

## 2017-04-24 DIAGNOSIS — M545 Low back pain, unspecified: Secondary | ICD-10-CM

## 2017-04-24 DIAGNOSIS — R51 Headache: Secondary | ICD-10-CM | POA: Diagnosis not present

## 2017-04-24 DIAGNOSIS — Z9989 Dependence on other enabling machines and devices: Secondary | ICD-10-CM | POA: Diagnosis not present

## 2017-04-24 DIAGNOSIS — G4733 Obstructive sleep apnea (adult) (pediatric): Secondary | ICD-10-CM

## 2017-04-24 DIAGNOSIS — R519 Headache, unspecified: Secondary | ICD-10-CM

## 2017-04-24 NOTE — Progress Notes (Signed)
Chief Complaint  Patient presents with  . Back Pain    soreness, onset x 2 weeks, given pain killers and muscle relaxers by sagardia, not helping much since he is lifting daily    HPI Back strain Pt reports that he was having some back pain and saw Dr. Alvy Bimler on 04/14/17 States that he does very heavy lifting at work and is always called on to do heavy lifting He states that he hurt his back and developed some low back pain He reports that he took cyclobenzaprine and Diclofenac  He states that he has lower back pain  He states that he felt some improvement but every time he goes to work he has to lift again and this aggravates his back    Headaches and OSA He reports that he has decreased his cpap setting to [facility] and still gets headaches He reports that his initial setting was at [facility] He has some daytime fatigue but not as much as before He went to a different sleep medicine doctor who said continue at the current settings He just feels so much pressure in his head   Past Medical History:  Diagnosis Date  . Allergy    allergy shots at [facility]  . Asthma   . Hypertension   . Sleep apnea     Current Outpatient Prescriptions  Medication Sig Dispense Refill  . amLODipine (NORVASC) 10 MG tablet Take 1 tablet (10 mg total) by mouth daily. 90 tablet 3  . cloNIDine (CATAPRES - DOSED IN MG/24 HR) 0.1 mg/24hr patch APPLY 1 PATCH(0.1 MG) EXTERNALLY TO THE SKIN 1 TIME A WEEK 4 patch 1  . cyclobenzaprine (FLEXERIL) 10 MG tablet Take 1 tablet (10 mg total) by mouth 3 (three) times daily as needed for muscle spasms. 30 tablet 0  . ipratropium (ATROVENT) 0.06 % nasal spray Place 2 sprays into both nostrils 4 (four) times daily. 15 mL 0  . losartan-hydrochlorothiazide (HYZAAR) 100-25 MG tablet Take 1 tablet by mouth daily. 90 tablet 3  . Multiple Vitamins-Minerals (ADEKS) chewable tablet Chew 1 tablet by mouth daily.    Marland Kitchen aspirin EC 81 MG tablet Take 1 tablet (81 mg total)  by mouth daily. (Patient not taking: Reported on 04/14/2017)    . fluticasone (FLONASE) 50 MCG/ACT nasal spray Place 2 sprays into both nostrils daily. 16 g 11  . hydrALAZINE (APRESOLINE) 50 MG tablet Take 1 tablet (50 mg total) by mouth 3 (three) times daily. 90 tablet 3  . tadalafil (CIALIS) 5 MG tablet Take 1 tablet (5 mg total) by mouth daily as needed for erectile dysfunction. (Patient not taking: Reported on 04/14/2017) 10 tablet 0   No current facility-administered medications for this visit.     Allergies: No Known Allergies  Past Surgical History:  Procedure Laterality Date  . ACHILLES TENDON REPAIR    . NASAL SINUS SURGERY      Social History   Social History  . Marital status: Single    Spouse name: N/A  . Number of children: 0  . Years of education: college   Occupational History  .  [employer]   Social History Main Topics  . Smoking status: Light Tobacco Smoker  . Smokeless tobacco: Never Used     Comment: 3 cig a day   . Alcohol use No  . Drug use: No  . Sexual activity: Yes   Other Topics Concern  . None   Social History Narrative   Marital status: married;  Children: none      Lives: with wife.      Employment: works at [employer]; Solicitor.      Tobacco: none      Alcohol: none      Drugs: none      Exercise: gym 4-5 days per week.   Denies  Caffeine use     ROS Review of Systems See HPI Constitution: No fevers or chills No malaise No diaphoresis Skin: No rash or itching Eyes: no blurry vision, no double vision GU: no dysuria or hematuria Neuro: no dizziness or tremors  Objective: Vitals:   04/24/17 1630  BP: 140/90  Pulse: 96  Resp: 16  Temp: 98.9 F (37.2 C)  TempSrc: Oral  SpO2: 95%  Weight: 263 lb 9.6 oz (119.6 kg)  Height:  (1.778 m)    Physical Exam  Constitutional: He is oriented to person, place, and time. He appears well-developed and well-nourished.  HENT:  Head: Normocephalic and atraumatic.    Right Ear: External ear normal.  Left Ear: External ear normal.  Nose: Nose normal.  Mouth/Throat: Oropharynx is clear and moist.  Eyes: Conjunctivae and EOM are normal.  Cardiovascular: Normal rate, regular rhythm and normal heart sounds.   Pulmonary/Chest: Effort normal and breath sounds normal. No respiratory distress. He has no wheezes.  Musculoskeletal:       Lumbar back: He exhibits spasm. He exhibits normal range of motion, no tenderness, no bony tenderness, no swelling, no edema, no deformity, no laceration and no pain.       Back:  Neurological: He is alert and oriented to person, place, and time. No cranial nerve deficit. Coordination normal.    Assessment and Plan Taimur was seen today for back pain.  Diagnoses and all orders for this visit:  OSA on CPAP Frequent headaches   With the continued symptoms even on low pressure setting there is concern that he has sinus headaches Will refer to ENT to evaluate if he has a mechanical obstruction -     Ambulatory referral to ENT  Acute midline low back pain without sciatica- gave work note to have lift help and continuing nsaid and stretches  Other orders -     Cancel: Flu Vaccine QUAD 36+ mos IM -     Cancel: Tdap vaccine greater than or equal to 7yo IM -     Cancel: Ambulatory referral to Gastroenterology     Chrishauna Mee A Creta Levin

## 2017-04-24 NOTE — Patient Instructions (Addendum)
     IF you received an x-ray today, you will receive an invoice from [facility]. Please contact [facility] at [phone] with questions or concerns regarding your invoice.   IF you received labwork today, you will receive an invoice from [facility]. Please contact [facility] at [phone] with questions or concerns regarding your invoice.   Our billing staff will not be able to assist you with questions regarding bills from these companies.  You will be contacted with the lab results as soon as they are available. The fastest way to get your results is to activate your My Chart account. Instructions are located on the last page of this paperwork. If you have not heard from us regarding the results in 2 weeks, please contact this office.     Sinus Headache A sinus headache occurs when the paranasal sinuses become clogged or swollen. Paranasal sinuses are air pockets within the bones of the face. Sinus headaches can range from mild to severe. What are the causes? A sinus headache can result from various conditions that affect the sinuses, such as:  Colds.  Sinus infections.  Allergies.  What are the signs or symptoms? The main symptom of this condition is a headache that may feel like pain or pressure in the face, forehead, ears, or upper teeth. People who have a sinus headache often have other symptoms, such as:  Congested or runny nose.  Fever.  Inability to smell.  Weather changes can make symptoms worse. How is this diagnosed? This condition may be diagnosed based on:  A physical exam and medical history.  Imaging tests, such as a CT scan and MRI, to check for problems with the sinuses.  A specialist may look into the sinuses with a tool that has a camera (endoscopy).  How is this treated? Treatment for this condition depends on the cause.  Sinus pain that is caused by a sinus infection may be treated with antibiotic medicine.  Sinus pain that is  caused by allergies may be helped by allergy medicines (antihistamines) and medicated nasal sprays.  Sinus pain that is caused by congestion may be helped by flushing the nose and sinuses with saline solution.  Follow these instructions at home:  Take medicines only as directed by your health care provider.  If you were prescribed an antibiotic medicine, finish all of it even if you start to feel better.  If you have congestion, use a nasal spray to help reduce pressure.  If directed, apply a warm, moist washcloth to your face to help relieve pain. Contact a health care provider if:  You have headaches more than one time each week.  You have sensitivity to light or sound.  You have a fever.  You feel sick to your stomach (nauseous) or you throw up (vomit).  Your headaches do not get better with treatment. Many people think that they have a sinus headache when they actually have migraines or tension headaches. Get help right away if:  You have vision problems.  You have sudden, severe pain in your face or head.  You have a seizure.  You are confused.  You have a stiff neck. This information is not intended to replace advice given to you by your health care provider. Make sure you discuss any questions you have with your health care provider. Document Released: 08/04/2004 Document Revised: 02/21/2016 Document Reviewed: 06/23/2014 Elsevier Interactive Patient Education  2018 Elsevier Inc.  

## 2017-05-04 ENCOUNTER — Ambulatory Visit (INDEPENDENT_AMBULATORY_CARE_PROVIDER_SITE_OTHER): Payer: Federal, State, Local not specified - PPO | Admitting: Family Medicine

## 2017-05-04 VITALS — BP 132/80 | HR 94 | Temp 98.6°F | Resp 18 | Ht 70.0 in | Wt 263.8 lb

## 2017-05-04 DIAGNOSIS — R519 Headache, unspecified: Secondary | ICD-10-CM

## 2017-05-04 DIAGNOSIS — R51 Headache: Secondary | ICD-10-CM

## 2017-05-04 DIAGNOSIS — J321 Chronic frontal sinusitis: Secondary | ICD-10-CM

## 2017-05-04 DIAGNOSIS — S39012S Strain of muscle, fascia and tendon of lower back, sequela: Secondary | ICD-10-CM | POA: Diagnosis not present

## 2017-05-04 MED ORDER — PREDNISONE 20 MG PO TABS: 40.0000 mg | ORAL_TABLET | Freq: Every day | ORAL | 0 refills | Status: DC

## 2017-05-04 NOTE — Patient Instructions (Addendum)
[facility] Address: [address] [place], [place], [place] [address]  Phone: [phone]  Call for an appointment for chronic sinusitis and sinus headaches and Obstructive Sleep Apnea   IF you received an x-ray today, you will receive an invoice from [facility]. Please contact [facility] at [phone] with questions or concerns regarding your invoice.   IF you received labwork today, you will receive an invoice from [place]. Please contact [facility] at [phone] with questions or concerns regarding your invoice.   Our billing staff will not be able to assist you with questions regarding bills from these companies.  You will be contacted with the lab results as soon as they are available. The fastest way to get your results is to activate your My Chart account. Instructions are located on the last page of this paperwork. If you have not heard from [place] regarding the results in 2 weeks, please contact this office.     Sinusitis, Adult Sinusitis is soreness and inflammation of your sinuses. Sinuses are hollow spaces in the bones around your face. Your sinuses are located:  Around your eyes.  In the middle of your forehead.  Behind your nose.  In your cheekbones.  Your sinuses and nasal passages are lined with a stringy fluid (mucus). Mucus normally drains out of your sinuses. When your nasal tissues become inflamed or swollen, the mucus can become trapped or blocked so air cannot flow through your sinuses. This allows bacteria, viruses, and funguses to grow, which leads to infection. Sinusitis can develop quickly and last for 7?10 days (acute) or for more than 12 weeks (chronic). Sinusitis often develops after a cold. What are the causes? This condition is caused by anything that creates swelling in the sinuses or stops mucus from draining, including:  Allergies.  Asthma.  Bacterial or viral infection.  Abnormally shaped bones between the nasal  passages.  Nasal growths that contain mucus (nasal polyps).  Narrow sinus openings.  Pollutants, such as chemicals or irritants in the air.  A foreign object stuck in the nose.  A fungal infection. This is rare.  What increases the risk? The following factors may make you more likely to develop this condition:  Having allergies or asthma.  Having had a recent cold or respiratory tract infection.  Having structural deformities or blockages in your nose or sinuses.  Having a weak immune system.  Doing a lot of swimming or diving.  Overusing nasal sprays.  Smoking.  What are the signs or symptoms? The main symptoms of this condition are pain and a feeling of pressure around the affected sinuses. Other symptoms include:  Upper toothache.  Earache.  Headache.  Bad breath.  Decreased sense of smell and taste.  A cough that may get worse at night.  Fatigue.  Fever.  Thick drainage from your nose. The drainage is often green and it may contain pus (purulent).  Stuffy nose or congestion.  Postnasal drip. This is when extra mucus collects in the throat or back of the nose.  Swelling and warmth over the affected sinuses.  Sore throat.  Sensitivity to light.  How is this diagnosed? This condition is diagnosed based on symptoms, a medical history, and a physical exam. To find out if your condition is acute or chronic, your health care provider may:  Look in your nose for signs of nasal polyps.  Tap over the affected sinus to check for signs of infection.  View the inside of your sinuses using an imaging  device that has a light attached (endoscope).  If your health care provider suspects that you have chronic sinusitis, you may also:  Be tested for allergies.  Have a sample of mucus taken from your nose (nasal culture) and checked for bacteria.  Have a mucus sample examined to see if your sinusitis is related to an allergy.  If your sinusitis does not  respond to treatment and it lasts longer than 8 weeks, you may have an MRI or CT scan to check your sinuses. These scans also help to determine how severe your infection is. In rare cases, a bone biopsy may be done to rule out more serious types of fungal sinus disease. How is this treated? Treatment for sinusitis depends on the cause and whether your condition is chronic or acute. If a virus is causing your sinusitis, your symptoms will go away on their own within 10 days. You may be given medicines to relieve your symptoms, including:  Topical nasal decongestants. They shrink swollen nasal passages and let mucus drain from your sinuses.  Antihistamines. These drugs block inflammation that is triggered by allergies. This can help to ease swelling in your nose and sinuses.  Topical nasal corticosteroids. These are nasal sprays that ease inflammation and swelling in your nose and sinuses.  Nasal saline washes. These rinses can help to get rid of thick mucus in your nose.  If your condition is caused by bacteria, you will be given an antibiotic medicine. If your condition is caused by a fungus, you will be given an antifungal medicine. Surgery may be needed to correct underlying conditions, such as narrow nasal passages. Surgery may also be needed to remove polyps. Follow these instructions at home: Medicines  Take, use, or apply over-the-counter and prescription medicines only as told by your health care provider. These may include nasal sprays.  If you were prescribed an antibiotic medicine, take it as told by your health care provider. Do not stop taking the antibiotic even if you start to feel better. Hydrate and Humidify  Drink enough water to keep your urine clear or pale yellow. Staying hydrated will help to thin your mucus.  Use a cool mist humidifier to keep the humidity level in your home above 50%.  Inhale steam for 10-15 minutes, 3-4 times a day or as told by your health care  provider. You can do this in the bathroom while a hot shower is running.  Limit your exposure to cool or dry air. Rest  Rest as much as possible.  Sleep with your head raised (elevated).  Make sure to get enough sleep each night. General instructions  Apply a warm, moist washcloth to your face 3-4 times a day or as told by your health care provider. This will help with discomfort.  Wash your hands often with soap and water to reduce your exposure to viruses and other germs. If soap and water are not available, use hand sanitizer.  Do not smoke. Avoid being around people who are smoking (secondhand smoke).  Keep all follow-up visits as told by your health care provider. This is important. Contact a health care provider if:  You have a fever.  Your symptoms get worse.  Your symptoms do not improve within 10 days. Get help right away if:  You have a severe headache.  You have persistent vomiting.  You have pain or swelling around your face or eyes.  You have vision problems.  You develop confusion.  Your neck is stiff.  You have trouble breathing. This information is not intended to replace advice given to you by your health care provider. Make sure you discuss any questions you have with your health care provider. Document Released: 06/27/2005 Document Revised: 02/21/2016 Document Reviewed: 04/22/2015 Elsevier Interactive Patient Education  2017 [employer]

## 2017-05-04 NOTE — Progress Notes (Signed)
Chief Complaint  Patient presents with  . Back Pain  . Sinusitis    Appt schedule with [facility] for tues 05/09/2017 at 2:50 PM FOR CHRONIC SINUSITIS AND OBSTRUCTIVE SLEEP APNEA.  PT AGREEABLE  WITH APPT DATE AND TIME.  Marland Kitchen cpap    HPI   Pt is here for a return to work note States that he continues to have back pain but could return to work with restrictions He still is the guy doing most of the lifting at work and would like to get lift help He can lift about ten pounds Bending and reaching aggravates his back pain   He also has appt with ENT to evaluate his chronic sinusitis and difficulty tolerating his CPAP He is snoring at night and still tired in the daytime With persistent headache and sinus congestion 4 review of systems  Past Medical History:  Diagnosis Date  . Allergy    allergy shots at [facility]  . Asthma   . Hypertension   . Sleep apnea     Current Outpatient Medications  Medication Sig Dispense Refill  . amLODipine (NORVASC) 10 MG tablet Take 1 tablet (10 mg total) by mouth daily. 90 tablet 3  . aspirin EC 81 MG tablet Take 1 tablet (81 mg total) by mouth daily.    . cloNIDine (CATAPRES - DOSED IN MG/24 HR) 0.1 mg/24hr patch APPLY 1 PATCH(0.1 MG) EXTERNALLY TO THE SKIN 1 TIME A WEEK 4 patch 1  . cyclobenzaprine (FLEXERIL) 10 MG tablet Take 1 tablet (10 mg total) by mouth 3 (three) times daily as needed for muscle spasms. 30 tablet 0  . fluticasone (FLONASE) 50 MCG/ACT nasal spray Place 2 sprays into both nostrils daily. 16 g 11  . ipratropium (ATROVENT) 0.06 % nasal spray Place 2 sprays into both nostrils 4 (four) times daily. 15 mL 0  . losartan-hydrochlorothiazide (HYZAAR) 100-25 MG tablet Take 1 tablet by mouth daily. 90 tablet 3  . Multiple Vitamins-Minerals (ADEKS) chewable tablet Chew 1 tablet by mouth daily.    . tadalafil (CIALIS) 5 MG tablet Take 1 tablet (5 mg total) by mouth daily as needed for erectile dysfunction. 10 tablet 0  .  hydrALAZINE (APRESOLINE) 50 MG tablet Take 1 tablet (50 mg total) by mouth 3 (three) times daily. 90 tablet 3   No current facility-administered medications for this visit.     Allergies: No Known Allergies  Past Surgical History:  Procedure Laterality Date  . ACHILLES TENDON REPAIR    . NASAL SINUS SURGERY      Social History   Socioeconomic History  . Marital status: Single    Spouse name: None  . Number of children: 0  . Years of education: college  . Highest education level: None  Social Needs  . Financial resource strain: None  . Food insecurity - worry: None  . Food insecurity - inability: None  . Transportation needs - medical: None  . Transportation needs - non-medical: None  Occupational History    Employer: [employer]  Tobacco Use  . Smoking status: Light Tobacco Smoker  . Smokeless tobacco: Never Used  . Tobacco comment: 3 cig a day   Substance and Sexual Activity  . Alcohol use: No    Alcohol/week: 0.0 oz  . Drug use: No  . Sexual activity: Yes  Other Topics Concern  . None  Social History Narrative   Marital status: married;       Children: none  Lives: with wife.      Employment: works at [employer]; Solicitorclerk.      Tobacco: none      Alcohol: none      Drugs: none      Exercise: gym 4-5 days per week.   Denies  Caffeine use     Family History  Problem Relation Age of Onset  . Hypertension Mother   . Cancer Mother   . Hypertension Father      ROS Review of Systems See HPI Constitution: No fevers or chills No malaise No diaphoresis Skin: No rash or itching Eyes: no blurry vision, no double vision GU: no dysuria or hematuria Neuro: no dizziness or headaches  Objective: Vitals:   05/04/17 1609  BP: 132/80  Pulse: 94  Resp: 18  Temp: 98.6 F (37 C)  TempSrc: Oral  SpO2: 96%  Weight: 263 lb 12.8 oz (119.7 kg)  Height: 5\' 10"  (1.778 m)    Physical Exam General: alert, oriented, in NAD Head: normocephalic,  atraumatic, no sinus tenderness Eyes: EOM intact, no scleral icterus or conjunctival injection Ears: TM clear bilaterally Nose: mucosa nonerythematous, nonedematous Throat: no pharyngeal exudate or erythema Lymph: no posterior auricular, submental or cervical lymph adenopathy Heart: normal rate, normal sinus rhythm, no murmurs Lungs: clear to auscultation bilaterally, no wheezing   Assessment and Plan [name] was seen today for back pain, sinusitis and cpap.  Diagnoses and all orders for this visit:  Chronic frontal sinusitis  Frequent headaches  Strain of lumbar region, sequela  Other orders -     Discontinue: predniSONE (DELTASONE) 20 MG tablet; Take 2 tablets (40 mg total) by mouth daily with breakfast.   Work note given Follow up with ENT Continue cpap as tolerated  Regina Coppolino A Mantaj Chamberlin

## 2017-05-06 ENCOUNTER — Encounter (HOSPITAL_COMMUNITY): Payer: Self-pay | Admitting: Emergency Medicine

## 2017-05-06 ENCOUNTER — Ambulatory Visit (HOSPITAL_COMMUNITY)
Admission: EM | Admit: 2017-05-06 | Discharge: 2017-05-06 | Disposition: A | Discharge status: Home / Self Care | Payer: Federal, State, Local not specified - PPO | Attending: Family Medicine | Admitting: Family Medicine

## 2017-05-06 DIAGNOSIS — I10 Essential (primary) hypertension: Secondary | ICD-10-CM

## 2017-05-06 DIAGNOSIS — S39012A Strain of muscle, fascia and tendon of lower back, initial encounter: Secondary | ICD-10-CM | POA: Diagnosis not present

## 2017-05-06 NOTE — Discharge Instructions (Signed)
Remember to take your blood pressure medicine

## 2017-05-06 NOTE — ED Provider Notes (Signed)
[facility]   [id number] 05/06/17 Arrival Time: 1733   SUBJECTIVE:  Dylan Washington is a [age] y.o. male who presents to the urgent care with complaint of chronic back pain.  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  He has rested and wants a note to go back to work.  He forgot his clonidine patch today  Note from 10/15 Back strain Pt reports that he was having some back pain and saw Dr. Alvy BimlerSagardia on 04/14/17 States that he does very heavy lifting at work and is always called on to do heavy lifting He states that he hurt his back and developed some low back pain He reports that he took cyclobenzaprine and Diclofenac  He states that he has lower back pain  He states that he felt some improvement but every time he goes to work he has to lift again and this aggravates his back   Note from 10/5 Back Pain  This is a new problem. The current episode started yesterday. The problem occurs constantly. The problem has been waxing and waning since onset. The pain is present in the lumbar spine. The quality of the pain is described as aching. The pain does not radiate. The pain is at a severity of 7/10. The pain is moderate. The pain is the same all the time. The symptoms are aggravated by bending and position. Pertinent negatives include no abdominal pain, bladder incontinence, bowel incontinence, chest pain, dysuria, fever, leg pain, numbness, paresthesias, pelvic pain, perianal numbness, tingling, weakness or weight loss. Risk factors: none. He has tried nothing for the symptoms.     Past Medical History:  Diagnosis Date  . Allergy    [facility]ergy shots at [facility]  . Asthma   . Hypertension   . Sleep apnea    Family History  Problem Relation Age of Onset  . Hypertension Mother   . Cancer Mother   . Hypertension Father     Social History   Social History  . Marital status: Single    Spouse name: N/A  . Number of children: 0  . Years of education: college   Occupational History  .  [employer]   Social History Main Topics  . Smoking status: Light Tobacco Smoker  . Smokeless tobacco: Never Used     Comment: 3 cig a day   . Alcohol use No  . Drug use: No  . Sexual activity: Yes   Other Topics Concern  . Not on file   Social History Narrative   Marital status: married;       Children: none      Lives: with wife.      Employment: works at [employer]; Solicitorclerk.      Tobacco: none      Alcohol: none      Drugs: none      Exercise: gym 4-5 days per week.   Denies  Caffeine use    Current Meds  Medication Sig  . amLODipine (NORVASC) 10 MG tablet Take 1 tablet (10 mg total) by mouth daily.  Marland Kitchen. aspirin EC 81 MG tablet Take 1 tablet (81 mg total) by mouth daily.  . cloNIDine (CATAPRES - DOSED IN MG/24 HR) 0.1 mg/24hr patch APPLY 1 PATCH(0.1 MG) EXTERNALLY TO THE SKIN 1 TIME A WEEK  .  cyclobenzaprine (FLEXERIL) 10 MG tablet Take 1 tablet (10 mg total) by mouth 3 (three) times daily as needed for muscle spasms.  . fluticasone (FLONASE) 50 MCG/ACT nasal spray Place 2 sprays into both nostrils daily.  Marland Kitchen ipratropium (ATROVENT) 0.06 % nasal spray Place 2 sprays into both nostrils 4 (four) times daily.  Marland Kitchen losartan-hydrochlorothiazide (HYZAAR) 100-25 MG tablet Take 1 tablet by mouth daily.  . Multiple Vitamins-Minerals (ADEKS) chewable tablet Chew 1 tablet by mouth daily.   No Known Allergies    ROS: As per HPI, remainder of ROS negative.   OBJECTIVE:   Vitals:   05/06/17 1829  BP: (!) 147/104  Pulse: 86  Resp: 18  Temp: 98.6 F (37 C)  TempSrc: Oral  SpO2: 98%     General appearance: alert; no distress Eyes: PERRL; EOMI; conjunctiva normal HENT: normocephalic; atraumatic; TMs normal, canal normal, external ears normal without trauma; nasal mucosa normal; oral mucosa normal Neck:  supple Lungs: clear to auscultation bilaterally Heart: regular rate and rhythm Abdomen: soft, non-tender; bowel sounds normal; no masses or organomegaly; no guarding or rebound tenderness Back: no CVA tenderness Extremities: no cyanosis or edema; symmetrical with no gross deformities Skin: warm and dry Neurologic: normal gait; grossly normal Psychological: alert and cooperative; normal mood and affect      Labs:  Results for orders placed or performed in visit on 10/19/16  Basic metabolic panel  Result Value Ref Range   Glucose 92 65 - 99 mg/dL   BUN 6 6 - 24 mg/dL   Creatinine, Ser 4.78 0.76 - 1.27 mg/dL   GFR calc non Af Amer 96 >59 mL/min/1.73   GFR calc Af Amer 111 >59 mL/min/1.73   BUN/Creatinine Ratio 7 (L) 9 - 20   Sodium 140 134 - 144 mmol/L   Potassium 3.9 3.5 - 5.2 mmol/L   Chloride 100 96 - 106 mmol/L   CO2 25 18 - 29 mmol/L   Calcium 9.4 8.7 - 10.2 mg/dL    Labs Reviewed - No data to display  No results found.     ASSESSMENT & PLAN:  1. Strain of lumbar region, initial encounter   2. Essential hypertension     Reviewed expectations re: course of current medical issues. Questions answered. Outlined signs and symptoms indicating need for more acute intervention. Patient verbalized understanding. After Visit Summary given.    Procedures:      Elvina Sidle, MD 05/06/17 [phone]

## 2017-05-06 NOTE — ED Triage Notes (Signed)
C/o intermittent back pain onset 3 weeks  Works for [employer].... Lifting heavy objects constantly  A&O x4... NAD... Ambulatory

## 2017-05-21 ENCOUNTER — Encounter: Payer: Self-pay | Admitting: Family Medicine

## 2017-05-21 ENCOUNTER — Ambulatory Visit (HOSPITAL_COMMUNITY)
Admission: EM | Admit: 2017-05-21 | Discharge: 2017-05-21 | Disposition: A | Discharge status: Home / Self Care | Payer: Federal, State, Local not specified - PPO | Attending: Family Medicine | Admitting: Family Medicine

## 2017-05-21 ENCOUNTER — Encounter (HOSPITAL_COMMUNITY): Payer: Self-pay | Admitting: Family Medicine

## 2017-05-21 DIAGNOSIS — S39012D Strain of muscle, fascia and tendon of lower back, subsequent encounter: Secondary | ICD-10-CM | POA: Diagnosis not present

## 2017-05-21 NOTE — Discharge Instructions (Signed)
I cannot rewrite this particular note again.

## 2017-05-21 NOTE — ED Triage Notes (Signed)
Pt here for back pain and f/u

## 2017-05-21 NOTE — ED Provider Notes (Signed)
[facility]   [id number] 05/21/17 Arrival Time: 1649   SUBJECTIVE:  Tanda RockersMarty Amano is a 56 y.o. male who presents to the urgent care with complaint of needing his note rewritten.  No pain at present   Note from 05/06/2017: Tanda RockersMarty Kirkpatrick is a 56 y.o. male who presents to the urgent care with complaint of chronic back pain.  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  He has rested and wants a note to go back to work.  He forgot his clonidine patch today  Note from 10/15 Back strain Pt reports that he was having some back pain and saw Dr. Alvy BimlerSagardia on 04/14/17 States that he does very heavy lifting at work and is always called on to do heavy lifting He states that he hurt his back and developed some low back pain He reports that he took cyclobenzaprine and Diclofenac  He states that he has lower back pain  He states that he felt some improvement but every time he goes to work he has to lift again and this aggravates his back   Note from 10/5 Back Pain This is a newproblem. The current episode started yesterday. The problem occurs constantly. The problem has been waxing and waningsince onset. The pain is present in the lumbar spine. The quality of the pain is described as aching. The pain does not radiate. The pain is at a severity of 7/10. The pain is moderate. The pain is the same all the time. The symptoms are aggravated by bending and position. Pertinent negatives include no abdominal pain, bladder incontinence, bowel incontinence, chest pain, dysuria, fever, leg pain, numbness, paresthesias, pelvic pain, perianal numbness, tingling, weaknessor weight loss. Risk factors: none. He has tried nothingfor the symptoms.      Past Medical History:  Diagnosis Date  . Allergy    [facility]  . Asthma    . Hypertension   . Sleep apnea    Family History  Problem Relation Age of Onset  . Hypertension Mother   . Cancer Mother   . Hypertension Father    Social History   Socioeconomic History  . Marital status: Single    Spouse name: Not on file  . Number of children: 0  . Years of education: college  . Highest education level: Not on file  Social Needs  . Financial resource strain: Not on file  . Food insecurity - worry: Not on file  . Food insecurity - inability: Not on file  . Transportation needs - medical: Not on file  . Transportation needs - non-medical: Not on file  Occupational History    Employer: [employer]  Tobacco Use  . Smoking status: Light Tobacco Smoker  . Smokeless tobacco: Never Used  . Tobacco comment: 3 cig a day   Substance and Sexual Activity  . Alcohol use: No    Alcohol/week: 0.0 oz  . Drug use: No  . Sexual activity: Yes  Other Topics Concern  . Not on file  Social History Narrative   Marital status: married;       Children: none      Lives: with wife.      Employment: works at [employer]; Solicitorclerk.      Tobacco: none      Alcohol: none  Drugs: none      Exercise: gym 4-5 days per week.   Denies  Caffeine use    No outpatient medications have been marked as taking for the 05/21/17 encounter [facility](Hospital Encounter).   No Known Allergies    ROS: As per HPI, remainder of ROS negative.   OBJECTIVE:   Vitals:   05/21/17 1713  BP: (!) 150/96  Pulse: 91  Resp: 18  SpO2: 96%     General appearance: alert; no distress Eyes: PERRL; EOMI; conjunctiva normal HENT: normocephalic; atraumatic;  external ears normal without trauma; nasal mucosa normal; oral mucosa normal Neck: supple Back: no CVA tenderness Extremities: no cyanosis or edema; symmetrical with no gross deformities Skin: warm and dry Neurologic: normal gait; grossly normal Psychological: alert and cooperative; normal mood and affect      Labs:  Results for  orders placed or performed in visit on 10/19/16  Basic metabolic panel  Result Value Ref Range   Glucose 92 65 - 99 mg/dL   BUN 6 6 - 24 mg/dL   Creatinine, Ser 1.610.91 0.76 - 1.27 mg/dL   GFR calc non Af Amer 96 >59 mL/min/1.73   GFR calc Af Amer 111 >59 mL/min/1.73   BUN/Creatinine Ratio 7 (L) 9 - 20   Sodium 140 134 - 144 mmol/L   Potassium 3.9 3.5 - 5.2 mmol/L   Chloride 100 96 - 106 mmol/L   CO2 25 18 - 29 mmol/L   Calcium 9.4 8.7 - 10.2 mg/dL    Labs Reviewed - No data to display  No results found.     ASSESSMENT & PLAN:  1. Strain of lumbar region, subsequent encounter     No orders of the defined types were placed in this encounter.   Reviewed expectations re: course of current medical issues. Questions answered. Outlined signs and symptoms indicating need for more acute intervention. Patient verbalized understanding. After Visit Summary given.    Procedures:      Elvina SidleLauenstein, Taray Normoyle, MD 05/21/17 1731

## 2017-06-10 ENCOUNTER — Other Ambulatory Visit: Payer: Self-pay | Admitting: Family Medicine

## 2017-06-10 DIAGNOSIS — G4733 Obstructive sleep apnea (adult) (pediatric): Secondary | ICD-10-CM

## 2017-06-10 DIAGNOSIS — Z9989 Dependence on other enabling machines and devices: Secondary | ICD-10-CM

## 2017-06-23 ENCOUNTER — Other Ambulatory Visit: Payer: Self-pay

## 2017-06-23 ENCOUNTER — Ambulatory Visit: Payer: Federal, State, Local not specified - PPO | Admitting: Family Medicine

## 2017-06-23 ENCOUNTER — Encounter: Payer: Self-pay | Admitting: Family Medicine

## 2017-06-23 ENCOUNTER — Ambulatory Visit: Payer: Federal, State, Local not specified - PPO | Admitting: Physician Assistant

## 2017-06-23 VITALS — BP 122/82 | HR 86 | Temp 98.1°F | Resp 16 | Ht 70.0 in | Wt 267.0 lb

## 2017-06-23 DIAGNOSIS — J0100 Acute maxillary sinusitis, unspecified: Secondary | ICD-10-CM

## 2017-06-23 DIAGNOSIS — H6592 Unspecified nonsuppurative otitis media, left ear: Secondary | ICD-10-CM | POA: Diagnosis not present

## 2017-06-23 DIAGNOSIS — J069 Acute upper respiratory infection, unspecified: Secondary | ICD-10-CM

## 2017-06-23 DIAGNOSIS — G4733 Obstructive sleep apnea (adult) (pediatric): Secondary | ICD-10-CM | POA: Diagnosis not present

## 2017-06-23 MED ORDER — PREDNISONE 20 MG PO TABS: ORAL_TABLET | ORAL | 0 refills | Status: DC

## 2017-06-23 MED ORDER — AMOXICILLIN-POT CLAVULANATE 875-125 MG PO TABS: 1.0000 | ORAL_TABLET | Freq: Two times a day (BID) | ORAL | 0 refills | Status: DC

## 2017-06-23 NOTE — Progress Notes (Signed)
Patient ID: Dylan Washington, male    DOB: [birth year]  Age: 56 y.o. MRN: [id number]  Chief Complaint  Patient presents with  . 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  It is gradually gotten worse.  He has a lot of facial pressure and nasal congestion.  His left ears bothering him a little bit.  He has cough in the morning bringing up the postnasal drainage.  He is not been running fevers.  He is doing worse rather than better.  He does not smoke.  He does have a history of hypertension.  He has been taking some OTC Mucinex.  Current allergies, medications, problem list, past/family and social histories reviewed.  Objective:  BP 122/82   Pulse 86   Temp 98.1 F (36.7 C) (Oral)   Resp 16   Ht 5\' 10"  (1.778 m)   Wt 267 lb (121.1 kg)   SpO2 97%   BMI 38.31 kg/m   No major acute distress.  Vitals are stable.  His TMs normal on the right.  The left has some little blisters near the central portion of the drum.  The drum actually is not that red but it has a little blister formations and looks dull.  His throat is clear.  Neck supple without significant nodes.  Chest clear to auscultation.  Heart regular without murmur.  Assessment & Plan:   Assessment: 1. Acute non-recurrent maxillary sinusitis   2. Acute upper respiratory infection   3. Left serous otitis media, unspecified chronicity       Plan: See instructions  No orders of the defined types were placed in this encounter.   Meds ordered this encounter  Medications  . amoxicillin-clavulanate (AUGMENTIN) 875-125 MG tablet    Sig: Take 1 tablet by mouth 2 (two) times daily.    Dispense:  20 tablet    Refill:  0  . predniSONE (DELTASONE) 20 MG tablet    Sig: Take 3 daily for 3 days for sinuses    Dispense:  9 tablet    Refill:  0         Patient Instructions   Drink plenty of fluids and get enough rest  Take the Augmentin  (amoxicillin/clavulanate) 875 mg 1 twice daily with breakfast and supper  Take the prednisone 20 mg 3 pills once daily for 3 days  Use the fluticasone nose spray 2 sprays each nostril twice daily for 4 days, then back to once daily.  Take over-the-counter Claritin-D or Allegra-D to help with the head congestion also if needed.  Because of your history of high blood pressure, would only take 1 pill daily in the evening.  Your blood pressure is excellent and should be able to tolerate this fine.  Continue using the Mucinex if needed to keep the secretions thin.  Return if worse or not improving     IF you received an x-ray today, you will receive an invoice from [facility]. Please contact [facility] at [phone] with questions or concerns regarding your invoice.   IF you received labwork today, you will receive an invoice from [facility]. Please contact [facility] at [phone] with questions or concerns regarding your invoice.   Our billing staff will not be able to assist you with questions regarding bills from these companies.  You will be contacted with the lab results as soon as they are available. The  fastest way to get your results is to activate your My Chart account. Instructions are located on the last page of this paperwork. If you have not heard from us regarding the results in 2 weeks, please contact this office.         Return if symptoms worsen or fail to improve.   Shamus Desantis, MD 06/23/2017

## 2017-06-23 NOTE — Patient Instructions (Addendum)
Drink plenty of fluids and get enough rest  Take the Augmentin (amoxicillin/clavulanate) 875 mg 1 twice daily with breakfast and supper  Take the prednisone 20 mg 3 pills once daily for 3 days  Use the fluticasone nose spray 2 sprays each nostril twice daily for 4 days, then back to once daily.  Take over-the-counter Claritin-D or Allegra-D to help with the head congestion also if needed.  Because of your history of high blood pressure, would only take 1 pill daily in the evening.  Your blood pressure is excellent and should be able to tolerate this fine.  Continue using the Mucinex if needed to keep the secretions thin.  Return if worse or not improving     IF you received an x-ray today, you will receive an invoice from [facility]. Please contact [facility] at [phone] with questions or concerns regarding your invoice.   IF you received labwork today, you will receive an invoice from [facility]. Please contact [facility] at [phone] with questions or concerns regarding your invoice.   Our billing staff will not be able to assist you with questions regarding bills from these companies.  You will be contacted with the lab results as soon as they are available. The fastest way to get your results is to activate your My Chart account. Instructions are located on the last page of this paperwork. If you have not heard from us regarding the results in 2 weeks, please contact this office.

## 2017-07-05 ENCOUNTER — Other Ambulatory Visit: Payer: Self-pay

## 2017-07-05 ENCOUNTER — Ambulatory Visit (HOSPITAL_COMMUNITY)
Admission: EM | Admit: 2017-07-05 | Discharge: 2017-07-05 | Disposition: A | Discharge status: Home / Self Care | Payer: Federal, State, Local not specified - PPO | Attending: Family Medicine | Admitting: Family Medicine

## 2017-07-05 ENCOUNTER — Encounter (HOSPITAL_COMMUNITY): Payer: Self-pay | Admitting: Emergency Medicine

## 2017-07-05 DIAGNOSIS — I10 Essential (primary) hypertension: Secondary | ICD-10-CM | POA: Diagnosis not present

## 2017-07-05 NOTE — Discharge Instructions (Signed)
Continue your current medications as written: Amlodipine, clonidine patch, losartan hydrochlorothiazide, and hydralazine.

## 2017-07-05 NOTE — ED Provider Notes (Signed)
[facility]   [id number] 07/05/17 Arrival Time: 1506   SUBJECTIVE:  Dylan RockersMarty Washington is a 56 y.o. male who presents to the urgent care with complaint of having an elevated blood pressure for the last 4 weeks.  He says that he has avoided going to work thinking that his job would make his blood pressure go higher.  He also has some low back pain which he attributes to having his blood pressure be elevated.  He said that he has been [place] [facility] [place] and having a blood pressure in the 1 5160 range systolic.  In addition the patient states that his girlfriend broke up with him and to have his money.   Past Medical History:  Diagnosis Date  . Allergy    [facility]  . Asthma   . Hypertension   . Sleep apnea    Family History  Problem Relation Age of Onset  . Hypertension Mother   . Cancer Mother   . Hypertension Father    Social History   Socioeconomic History  . Marital status: Single    Spouse name: Not on file  . Number of children: 0  . Years of education: college  . Highest education level: Not on file  Social Needs  . Financial resource strain: Not on file  . Food insecurity - worry: Not on file  . Food insecurity - inability: Not on file  . Transportation needs - medical: Not on file  . Transportation needs - non-medical: Not on file  Occupational History    Employer: [employer]  Tobacco Use  . Smoking status: Light Tobacco Smoker  . Smokeless tobacco: Never Used  . Tobacco comment: 3 cig a day   Substance and Sexual Activity  . Alcohol use: No    Alcohol/week: 0.0 oz  . Drug use: No  . Sexual activity: Yes  Other Topics Concern  . Not on file  Social History Narrative   Marital status: married;       Children: none      Lives: with wife.      Employment: works at [employer]; Solicitorclerk.      Tobacco: none      Alcohol: none      Drugs: none      Exercise:  gym 4-5 days per week.   Denies  Caffeine use    No outpatient medications have been marked as taking for the 07/05/17 encounter [facility](Hospital Encounter).   No Known Allergies    ROS: As per HPI, remainder of ROS negative.   OBJECTIVE:   Vitals:   07/05/17 1541  BP: (!) 151/97  Pulse: 91  Resp: (!) 22  Temp: 98.1 F (36.7 C)  TempSrc: Oral  SpO2: 98%     General appearance: alert; no distress Eyes: PERRL; EOMI; conjunctiva normal HENT: normocephalic; atraumatic;  Neck: supple Lungs: clear to auscultation bilaterally Heart: regular rate and rhythm Back: no CVA tenderness Extremities: no cyanosis or edema; symmetrical with no gross deformities Skin: warm and dry Neurologic: normal gait; grossly normal; straight leg raising normal Psychological: alert and cooperative; normal mood and affect; avoids direct eye contact      Labs:  Results for orders placed or performed in visit on 10/19/16  Basic metabolic panel  Result Value Ref Range   Glucose 92 65 - 99 mg/dL   BUN 6 6 - 24 mg/dL   Creatinine, Ser  0.91 0.76 - 1.27 mg/dL   GFR calc non Af Amer 96 >59 mL/min/1.73   GFR calc Af Amer 111 >59 mL/min/1.73   BUN/Creatinine Ratio 7 (L) 9 - 20   Sodium 140 134 - 144 mmol/L   Potassium 3.9 3.5 - 5.2 mmol/L   Chloride 100 96 - 106 mmol/L   CO2 25 18 - 29 mmol/L   Calcium 9.4 8.7 - 10.2 mg/dL    Labs Reviewed - No data to display  No results found.     ASSESSMENT & PLAN:  1. Essential hypertension     No change in medication Reviewed expectations re: course of current medical issues. Questions answered. Outlined signs and symptoms indicating need for more acute intervention. Patient verbalized understanding. After Visit Summary given.      Elvina SidleLauenstein, Javionna Leder, MD 07/05/17 [phone]

## 2017-07-05 NOTE — ED Triage Notes (Signed)
Back pain is intermittent.  Current back pain episode started 2-3 weeks ago.  Denies pain into legs

## 2017-07-07 ENCOUNTER — Encounter: Payer: Self-pay | Admitting: Emergency Medicine

## 2017-07-07 ENCOUNTER — Ambulatory Visit: Payer: Federal, State, Local not specified - PPO | Admitting: Emergency Medicine

## 2017-07-07 VITALS — BP 130/80 | HR 103 | Temp 98.6°F | Resp 17 | Ht 70.0 in | Wt 258.0 lb

## 2017-07-07 DIAGNOSIS — M545 Low back pain, unspecified: Secondary | ICD-10-CM

## 2017-07-07 DIAGNOSIS — I10 Essential (primary) hypertension: Secondary | ICD-10-CM | POA: Diagnosis not present

## 2017-07-07 DIAGNOSIS — S39012A Strain of muscle, fascia and tendon of lower back, initial encounter: Secondary | ICD-10-CM

## 2017-07-07 MED ORDER — CLONIDINE 0.1 MG/24HR TD PTWK: MEDICATED_PATCH | TRANSDERMAL | 6 refills | Status: DC

## 2017-07-07 NOTE — Progress Notes (Signed)
Dylan RockersMarty Washington 56 y.o.   Chief Complaint  Patient presents with  . Back Pain    HISTORY OF PRESENT ILLNESS: This is a 56 y.o. male complaining of low back pain on and off x 2 months but worse the past 4 days. Also was started on Gabapentin (by Neurologist) last January x chronic headaches and has been having difficulty getting off medication; down to once a day (for 1 week now). Feels his headaches are secondary to high BP; has h/o HTN on multiple medications but ran out of Clonidine patch.  HPI   Prior to Admission medications   Medication Sig Start Date End Date Taking? Authorizing Provider  amLODipine (NORVASC) 10 MG tablet Take 1 tablet (10 mg total) by mouth daily. 12/22/16  Yes Dylan Washington, Dylan A, MD  aspirin EC 81 MG tablet Take 1 tablet (81 mg total) by mouth daily. 12/22/16  Yes Washington, Dylan A, MD  cloNIDine (CATAPRES - DOSED IN MG/24 HR) 0.1 mg/24hr patch APPLY 1 PATCH(0.1 MG) EXTERNALLY TO THE SKIN 1 TIME A WEEK 06/12/17  Yes Washington, Dylan A, MD  fluticasone (FLONASE) 50 MCG/ACT nasal spray Place 2 sprays into both nostrils daily. 10/22/16  Yes Elvina Washington, Kurt, MD  hydrALAZINE (APRESOLINE) 50 MG tablet Take 1 tablet (50 mg total) by mouth 3 (three) times daily. 12/22/16 07/07/17 Yes Washington, Dylan A, MD  ipratropium (ATROVENT) 0.06 % nasal spray Place 2 sprays into both nostrils 4 (four) times daily. 12/27/16  Yes Deatra Washington, Dylan J, FNP  losartan-hydrochlorothiazide (HYZAAR) 100-25 MG tablet Take 1 tablet by mouth daily. 07/20/16  Yes Akia Washington, Dylan KempfMiguel Jose, MD  Multiple Vitamins-Minerals (ADEKS) chewable tablet Chew 1 tablet by mouth daily.   Yes [provider]  tadalafil (CIALIS) 5 MG tablet Take 1 tablet (5 mg total) by mouth daily as needed for erectile dysfunction. 11/22/16  Yes Dylan Washington, Dylan A, MD    No Known Allergies  Patient Active Problem List   Diagnosis Date Noted  . Strain of lumbar region 04/14/2017  . Acute midline low back pain without sciatica  04/14/2017  . Chronic tension-type headache, not intractable 11/14/2016  . Bone spur of foot 02/08/2016  . RBBB (right bundle branch block) 04/02/2014  . Asthma 01/17/2012  . Environmental allergies 08/16/2011  . Hypertension   . Sleep apnea     Past Medical History:  Diagnosis Date  . Allergy    allergy shots at [facility]  . Asthma   . Hypertension   . Sleep apnea     Past Surgical History:  Procedure Laterality Date  . ACHILLES TENDON REPAIR    . NASAL SINUS SURGERY      Social History   Socioeconomic History  . Marital status: Single    Spouse name: Not on file  . Number of children: 0  . Years of education: college  . Highest education level: Not on file  Social Needs  . Financial resource strain: Not on file  . Food insecurity - worry: Not on file  . Food insecurity - inability: Not on file  . Transportation needs - medical: Not on file  . Transportation needs - non-medical: Not on file  Occupational History    Employer: [employer]  Tobacco Use  . Smoking status: Former Smoker    Last attempt to quit: 06/30/2017    Years since quitting: 0.0  . Smokeless tobacco: Never Used  . Tobacco comment: 3 cig a day   Substance and Sexual Activity  . Alcohol use:  No    Alcohol/week: 0.0 oz  . Drug use: No  . Sexual activity: Yes  Other Topics Concern  . Not on file  Social History Narrative   Marital status: married;       Children: none      Lives: with wife.      Employment: works at [employer]; Solicitorclerk.      Tobacco: none      Alcohol: none      Drugs: none      Exercise: gym 4-5 days per week.   Denies  Caffeine use     Family History  Problem Relation Age of Onset  . Hypertension Mother   . Cancer Mother   . Hypertension Father      Review of Systems  Constitutional: Negative for chills and fever.  HENT: Negative.   Eyes: Negative.   Respiratory: Negative.  Negative for cough and shortness of breath.   Cardiovascular:  Negative.  Negative for chest pain and palpitations.  Gastrointestinal: Negative.  Negative for abdominal pain, nausea and vomiting.  Genitourinary: Negative.  Negative for dysuria and hematuria.  Musculoskeletal: Positive for back pain.  Skin: Negative for rash.  Neurological: Positive for headaches.  Endo/Heme/Allergies: Negative.   All other systems reviewed and are negative.  Vitals:   07/07/17 1008  BP: 130/80  Pulse: (!) 103  Resp: 17  Temp: 98.6 F (37 C)  SpO2: 98%     Physical Exam  Constitutional: He is oriented to person, place, and time. He appears well-developed and well-nourished.  HENT:  Head: Normocephalic and atraumatic.  Nose: Nose normal.  Mouth/Throat: Oropharynx is clear and moist.  Eyes: Conjunctivae and EOM are normal. Pupils are equal, round, and reactive to light.  Neck: Normal range of motion. Neck supple. No JVD present.  Cardiovascular: Normal rate, regular rhythm and normal heart sounds.  Pulmonary/Chest: Effort normal and breath sounds normal.  Abdominal: Soft. Bowel sounds are normal. He exhibits no distension. There is no tenderness.  Musculoskeletal:       Lumbar back: He exhibits decreased range of motion and tenderness. He exhibits no bony tenderness, no spasm and normal pulse.  Lymphadenopathy:    He has no cervical adenopathy.  Neurological: He is alert and oriented to person, place, and time. He displays normal reflexes. No cranial nerve deficit or sensory deficit. He exhibits normal muscle tone. Coordination normal.  Skin: Skin is warm and dry. Capillary refill takes less than 2 seconds. No rash noted.  Psychiatric: He has a normal mood and affect. His behavior is normal.  Vitals reviewed.  Gabapentin use d/w patient. A total of 25 minutes was spent in the room with the patient, greater than 50% of which was in counseling/coordination of care.  ASSESSMENT & PLAN: [name] was seen today for back pain.  Diagnoses and all orders for this  visit:  Essential hypertension -     cloNIDine (CATAPRES - DOSED IN MG/24 HR) 0.1 mg/24hr patch; APPLY 1 PATCH(0.1 MG) EXTERNALLY TO THE SKIN 1 TIME A WEEK  Strain of lumbar region, initial encounter  Acute midline low back pain without sciatica    Patient Instructions       IF you received an x-ray today, you will receive an invoice from [facility]. Please contact [facility] at [phone] with questions or concerns regarding your invoice.   IF you received labwork today, you will receive an invoice from [facility]. Please contact [facility] at [phone] with questions or concerns regarding your  invoice.   Our billing staff will not be able to assist you with questions regarding bills from these companies.  You will be contacted with the lab results as soon as they are available. The fastest way to get your results is to activate your My Chart account. Instructions are located on the last page of this paperwork. If you have not heard from [place] regarding the results in 2 weeks, please contact this office.      Hypertension Hypertension, commonly called high blood pressure, is when the force of blood pumping through the arteries is too strong. The arteries are the blood vessels that carry blood from the heart throughout the body. Hypertension forces the heart to work harder to pump blood and may cause arteries to become narrow or stiff. Having untreated or uncontrolled hypertension can cause heart attacks, strokes, kidney disease, and other problems. A blood pressure reading consists of a higher number over a lower number. Ideally, your blood pressure should be below 120/80. The first ("top") number is called the systolic pressure. It is a measure of the pressure in your arteries as your heart beats. The second ("bottom") number is called the diastolic pressure. It is a measure of the pressure in your arteries as the heart relaxes. What are the causes? The cause of  this condition is not known. What increases the risk? Some risk factors for high blood pressure are under your control. Others are not. Factors you can change  Smoking.  Having type 2 diabetes mellitus, high cholesterol, or both.  Not getting enough exercise or physical activity.  Being overweight.  Having too much fat, sugar, calories, or salt (sodium) in your diet.  Drinking too much alcohol. Factors that are difficult or impossible to change  Having chronic kidney disease.  Having a family history of high blood pressure.  Age. Risk increases with age.  Race. You may be at higher risk if you are African-American.  Gender. Men are at higher risk than women before age 35. After age 43, women are at higher risk than men.  Having obstructive sleep apnea.  Stress. What are the signs or symptoms? Extremely high blood pressure (hypertensive crisis) may cause:  Headache.  Anxiety.  Shortness of breath.  Nosebleed.  Nausea and vomiting.  Severe chest pain.  Jerky movements you cannot control (seizures).  How is this diagnosed? This condition is diagnosed by measuring your blood pressure while you are seated, with your arm resting on a surface. The cuff of the blood pressure monitor will be placed directly against the skin of your upper arm at the level of your heart. It should be measured at least twice using the same arm. Certain conditions can cause a difference in blood pressure between your right and left arms. Certain factors can cause blood pressure readings to be lower or higher than normal (elevated) for a short period of time:  When your blood pressure is higher when you are in a health care provider's office than when you are at home, this is called white coat hypertension. Most people with this condition do not need medicines.  When your blood pressure is higher at home than when you are in a health care provider's office, this is called masked hypertension.  Most people with this condition may need medicines to control blood pressure.  If you have a high blood pressure reading during one visit or you have normal blood pressure with other risk factors:  You may be asked to return  on a different day to have your blood pressure checked again.  You may be asked to monitor your blood pressure at home for 1 week or longer.  If you are diagnosed with hypertension, you may have other blood or imaging tests to help your health care provider understand your overall risk for other conditions. How is this treated? This condition is treated by making healthy lifestyle changes, such as eating healthy foods, exercising more, and reducing your alcohol intake. Your health care provider may prescribe medicine if lifestyle changes are not enough to get your blood pressure under control, and if:  Your systolic blood pressure is above 130.  Your diastolic blood pressure is above 80.  Your personal target blood pressure may vary depending on your medical conditions, your age, and other factors. Follow these instructions at home: Eating and drinking  Eat a diet that is high in fiber and potassium, and low in sodium, added sugar, and fat. An example eating plan is called the DASH (Dietary Approaches to Stop Hypertension) diet. To eat this way: ? Eat plenty of fresh fruits and vegetables. Try to fill half of your plate at each meal with fruits and vegetables. ? Eat whole grains, such as whole wheat pasta, brown rice, or whole grain bread. Fill about one quarter of your plate with whole grains. ? Eat or drink low-fat dairy products, such as skim milk or low-fat yogurt. ? Avoid fatty cuts of meat, processed or cured meats, and poultry with skin. Fill about one quarter of your plate with lean proteins, such as fish, chicken without skin, beans, eggs, and tofu. ? Avoid premade and processed foods. These tend to be higher in sodium, added sugar, and fat.  Reduce your daily  sodium intake. Most people with hypertension should eat less than 1,500 mg of sodium a day.  Limit alcohol intake to no more than 1 drink a day for nonpregnant women and 2 drinks a day for men. One drink equals 12 oz of beer, 5 oz of wine, or 1 oz of hard liquor. Lifestyle  Work with your health care provider to maintain a healthy body weight or to lose weight. Ask what an ideal weight is for you.  Get at least 30 minutes of exercise that causes your heart to beat faster (aerobic exercise) most days of the week. Activities may include walking, swimming, or biking.  Include exercise to strengthen your muscles (resistance exercise), such as pilates or lifting weights, as part of your weekly exercise routine. Try to do these types of exercises for 30 minutes at least 3 days a week.  Do not use any products that contain nicotine or tobacco, such as cigarettes and e-cigarettes. If you need help quitting, ask your health care provider.  Monitor your blood pressure at home as told by your health care provider.  Keep all follow-up visits as told by your health care provider. This is important. Medicines  Take over-the-counter and prescription medicines only as told by your health care provider. Follow directions carefully. Blood pressure medicines must be taken as prescribed.  Do not skip doses of blood pressure medicine. Doing this puts you at risk for problems and can make the medicine less effective.  Ask your health care provider about side effects or reactions to medicines that you should watch for. Contact a health care provider if:  You think you are having a reaction to a medicine you are taking.  You have headaches that keep coming back (recurring).  You feel dizzy.  You have swelling in your ankles.  You have trouble with your vision. Get help right away if:  You develop a severe headache or confusion.  You have unusual weakness or numbness.  You feel faint.  You have  severe pain in your chest or abdomen.  You vomit repeatedly.  You have trouble breathing. Summary  Hypertension is when the force of blood pumping through your arteries is too strong. If this condition is not controlled, it may put you at risk for serious complications.  Your personal target blood pressure may vary depending on your medical conditions, your age, and other factors. For most people, a normal blood pressure is less than 120/80.  Hypertension is treated with lifestyle changes, medicines, or a combination of both. Lifestyle changes include weight loss, eating a healthy, low-sodium diet, exercising more, and limiting alcohol. This information is not intended to replace advice given to you by your health care provider. Make sure you discuss any questions you have with your health care provider. Document Released: 06/27/2005 Document Revised: 05/25/2016 Document Reviewed: 05/25/2016 Elsevier Interactive Patient Education  2018 Else[employer] Inc[employer]  Back Pain, Adult Back pain is very common. The pain often gets better over time. The cause of back pain is usually not dangerous. Most people can learn to manage their back pain on their own. Follow these instructions at home: Watch your back pain for any changes. The following actions may help to lessen any pain you are feeling:  Stay active. Start with short walks on flat ground if you can. Try to walk farther each day.  Exercise regularly as told by your doctor. Exercise helps your back heal faster. It also helps avoid future injury by keeping your muscles strong and flexible.  Do not sit, drive, or stand in one place for more than 30 minutes.  Do not stay in bed. Resting more than 1-2 days can slow down your recovery.  Be careful when you bend or lift an object. Use good form when lifting: ? Bend at your knees. ? Keep the object close to your body. ? Do not twist.  Sleep on a firm mattress. Lie on your side, and bend your knees. If  you lie on your back, put a pillow under your knees.  Take medicines only as told by your doctor.  Put ice on the injured area. ? Put ice in a plastic bag. ? Place a towel between your skin and the bag. ? Leave the ice on for 20 minutes, 2-3 times a day for the first 2-3 days. After that, you can switch between ice and heat packs.  Avoid feeling anxious or stressed. Find good ways to deal with stress, such as exercise.  Maintain a healthy weight. Extra weight puts stress on your back.  Contact a doctor if:  You have pain that does not go away with rest or medicine.  You have worsening pain that goes down into your legs or buttocks.  You have pain that does not get better in one week.  You have pain at night.  You lose weight.  You have a fever or chills. Get help right away if:  You cannot control when you poop (bowel movement) or pee (urinate).  Your arms or legs feel weak.  Your arms or legs lose feeling (numbness).  You feel sick to your stomach (nauseous) or throw up (vomit).  You have belly (abdominal) pain.  You feel like you may pass out (faint). This  information is not intended to replace advice given to you by your health care provider. Make sure you discuss any questions you have with your health care provider. Document Released: 12/14/2007 Document Revised: 12/03/2015 Document Reviewed: 10/29/2013 Elsevier Interactive Patient Education  2018 Elsevier Inc.      Edwina Barth, MD [facility]

## 2017-07-07 NOTE — Patient Instructions (Addendum)
IF you received an x-ray today, you will receive an invoice from [facility]. Please contact [facility] at [phone] with questions or concerns regarding your invoice.   IF you received labwork today, you will receive an invoice from [facility]. Please contact [facility] at [phone] with questions or concerns regarding your invoice.   Our billing staff will not be able to assist you with questions regarding bills from these companies.  You will be contacted with the lab results as soon as they are available. The fastest way to get your results is to activate your My Chart account. Instructions are located on the last page of this paperwork. If you have not heard from us regarding the results in 2 weeks, please contact this office.      Hypertension Hypertension, commonly called high blood pressure, is when the force of blood pumping through the arteries is too strong. The arteries are the blood vessels that carry blood from the heart throughout the body. Hypertension forces the heart to work harder to pump blood and may cause arteries to become narrow or stiff. Having untreated or uncontrolled hypertension can cause heart attacks, strokes, kidney disease, and other problems. A blood pressure reading consists of a higher number over a lower number. Ideally, your blood pressure should be below 120/80. The first ("top") number is called the systolic pressure. It is a measure of the pressure in your arteries as your heart beats. The second ("bottom") number is called the diastolic pressure. It is a measure of the pressure in your arteries as the heart relaxes. What are the causes? The cause of this condition is not known. What increases the risk? Some risk factors for high blood pressure are under your control. Others are not. Factors you can change  Smoking.  Having type 2 diabetes mellitus, high cholesterol, or both.  Not getting enough exercise or physical  activity.  Being overweight.  Having too much fat, sugar, calories, or salt (sodium) in your diet.  Drinking too much alcohol. Factors that are difficult or impossible to change  Having chronic kidney disease.  Having a family history of high blood pressure.  Age. Risk increases with age.  Race. You may be at higher risk if you are African-American.  Gender. Men are at higher risk than women before age 54. After age 54, women are at higher risk than men.  Having obstructive sleep apnea.  Stress. What are the signs or symptoms? Extremely high blood pressure (hypertensive crisis) may cause:  Headache.  Anxiety.  Shortness of breath.  Nosebleed.  Nausea and vomiting.  Severe chest pain.  Jerky movements you cannot control (seizures).  How is this diagnosed? This condition is diagnosed by measuring your blood pressure while you are seated, with your arm resting on a surface. The cuff of the blood pressure monitor will be placed directly against the skin of your upper arm at the level of your heart. It should be measured at least twice using the same arm. Certain conditions can cause a difference in blood pressure between your right and left arms. Certain factors can cause blood pressure readings to be lower or higher than normal (elevated) for a short period of time:  When your blood pressure is higher when you are in a health care provider's office than when you are at home, this is called white coat hypertension. Most people with this condition do not need medicines.  When your blood pressure is higher at home than when  you are in a health care provider's office, this is called masked hypertension. Most people with this condition may need medicines to control blood pressure.  If you have a high blood pressure reading during one visit or you have normal blood pressure with other risk factors:  You may be asked to return on a different day to have your blood pressure  checked again.  You may be asked to monitor your blood pressure at home for 1 week or longer.  If you are diagnosed with hypertension, you may have other blood or imaging tests to help your health care provider understand your overall risk for other conditions. How is this treated? This condition is treated by making healthy lifestyle changes, such as eating healthy foods, exercising more, and reducing your alcohol intake. Your health care provider may prescribe medicine if lifestyle changes are not enough to get your blood pressure under control, and if:  Your systolic blood pressure is above 130.  Your diastolic blood pressure is above 80.  Your personal target blood pressure may vary depending on your medical conditions, your age, and other factors. Follow these instructions at home: Eating and drinking  Eat a diet that is high in fiber and potassium, and low in sodium, added sugar, and fat. An example eating plan is called the DASH (Dietary Approaches to Stop Hypertension) diet. To eat this way: ? Eat plenty of fresh fruits and vegetables. Try to fill half of your plate at each meal with fruits and vegetables. ? Eat whole grains, such as whole wheat pasta, brown rice, or whole grain bread. Fill about one quarter of your plate with whole grains. ? Eat or drink low-fat dairy products, such as skim milk or low-fat yogurt. ? Avoid fatty cuts of meat, processed or cured meats, and poultry with skin. Fill about one quarter of your plate with lean proteins, such as fish, chicken without skin, beans, eggs, and tofu. ? Avoid premade and processed foods. These tend to be higher in sodium, added sugar, and fat.  Reduce your daily sodium intake. Most people with hypertension should eat less than 1,500 mg of sodium a day.  Limit alcohol intake to no more than 1 drink a day for nonpregnant women and 2 drinks a day for men. One drink equals 12 oz of beer, 5 oz of wine, or 1 oz of hard  liquor. Lifestyle  Work with your health care provider to maintain a healthy body weight or to lose weight. Ask what an ideal weight is for you.  Get at least 30 minutes of exercise that causes your heart to beat faster (aerobic exercise) most days of the week. Activities may include walking, swimming, or biking.  Include exercise to strengthen your muscles (resistance exercise), such as pilates or lifting weights, as part of your weekly exercise routine. Try to do these types of exercises for 30 minutes at least 3 days a week.  Do not use any products that contain nicotine or tobacco, such as cigarettes and e-cigarettes. If you need help quitting, ask your health care provider.  Monitor your blood pressure at home as told by your health care provider.  Keep all follow-up visits as told by your health care provider. This is important. Medicines  Take over-the-counter and prescription medicines only as told by your health care provider. Follow directions carefully. Blood pressure medicines must be taken as prescribed.  Do not skip doses of blood pressure medicine. Doing this puts you at risk for problems  and can make the medicine less effective.  Ask your health care provider about side effects or reactions to medicines that you should watch for. Contact a health care provider if:  You think you are having a reaction to a medicine you are taking.  You have headaches that keep coming back (recurring).  You feel dizzy.  You have swelling in your ankles.  You have trouble with your vision. Get help right away if:  You develop a severe headache or confusion.  You have unusual weakness or numbness.  You feel faint.  You have severe pain in your chest or abdomen.  You vomit repeatedly.  You have trouble breathing. Summary  Hypertension is when the force of blood pumping through your arteries is too strong. If this condition is not controlled, it may put you at risk for serious  complications.  Your personal target blood pressure may vary depending on your medical conditions, your age, and other factors. For most people, a normal blood pressure is less than 120/80.  Hypertension is treated with lifestyle changes, medicines, or a combination of both. Lifestyle changes include weight loss, eating a healthy, low-sodium diet, exercising more, and limiting alcohol. This information is not intended to replace advice given to you by your health care provider. Make sure you discuss any questions you have with your health care provider. Document Released: 06/27/2005 Document Revised: 05/25/2016 Document Reviewed: 05/25/2016 Elsevier Interactive Patient Education  2018 [employer] Inc[employer]  Back Pain, Adult Back pain is very common. The pain often gets better over time. The cause of back pain is usually not dangerous. Most people can learn to manage their back pain on their own. Follow these instructions at home: Watch your back pain for any changes. The following actions may help to lessen any pain you are feeling:  Stay active. Start with short walks on flat ground if you can. Try to walk farther each day.  Exercise regularly as told by your doctor. Exercise helps your back heal faster. It also helps avoid future injury by keeping your muscles strong and flexible.  Do not sit, drive, or stand in one place for more than 30 minutes.  Do not stay in bed. Resting more than 1-2 days can slow down your recovery.  Be careful when you bend or lift an object. Use good form when lifting: ? Bend at your knees. ? Keep the object close to your body. ? Do not twist.  Sleep on a firm mattress. Lie on your side, and bend your knees. If you lie on your back, put a pillow under your knees.  Take medicines only as told by your doctor.  Put ice on the injured area. ? Put ice in a plastic bag. ? Place a towel between your skin and the bag. ? Leave the ice on for 20 minutes, 2-3 times a day  for the first 2-3 days. After that, you can switch between ice and heat packs.  Avoid feeling anxious or stressed. Find good ways to deal with stress, such as exercise.  Maintain a healthy weight. Extra weight puts stress on your back.  Contact a doctor if:  You have pain that does not go away with rest or medicine.  You have worsening pain that goes down into your legs or buttocks.  You have pain that does not get better in one week.  You have pain at night.  You lose weight.  You have a fever or chills. Get help right away if:  You cannot control when you poop (bowel movement) or pee (urinate).  Your arms or legs feel weak.  Your arms or legs lose feeling (numbness).  You feel sick to your stomach (nauseous) or throw up (vomit).  You have belly (abdominal) pain.  You feel like you may pass out (faint). This information is not intended to replace advice given to you by your health care provider. Make sure you discuss any questions you have with your health care provider. Document Released: 12/14/2007 Document Revised: 12/03/2015 Document Reviewed: 10/29/2013 Elsevier Interactive Patient Education  [employer]2018 [employer] Inc[employer]

## 2017-07-12 ENCOUNTER — Other Ambulatory Visit: Payer: Self-pay | Admitting: Family Medicine

## 2017-07-29 ENCOUNTER — Other Ambulatory Visit: Payer: Self-pay

## 2017-07-29 ENCOUNTER — Encounter (HOSPITAL_COMMUNITY): Payer: Self-pay | Admitting: Family Medicine

## 2017-07-29 ENCOUNTER — Ambulatory Visit (HOSPITAL_COMMUNITY)
Admission: EM | Admit: 2017-07-29 | Discharge: 2017-07-29 | Disposition: A | Discharge status: Home / Self Care | Payer: Federal, State, Local not specified - PPO | Attending: Family Medicine | Admitting: Family Medicine

## 2017-07-29 DIAGNOSIS — I10 Essential (primary) hypertension: Secondary | ICD-10-CM | POA: Diagnosis not present

## 2017-07-29 DIAGNOSIS — R519 Headache, unspecified: Secondary | ICD-10-CM

## 2017-07-29 DIAGNOSIS — Z760 Encounter for issue of repeat prescription: Secondary | ICD-10-CM

## 2017-07-29 DIAGNOSIS — R51 Headache: Secondary | ICD-10-CM

## 2017-07-29 DIAGNOSIS — Z79899 Other long term (current) drug therapy: Secondary | ICD-10-CM | POA: Diagnosis not present

## 2017-07-29 MED ORDER — GABAPENTIN 100 MG PO CAPS: 100.0000 mg | ORAL_CAPSULE | Freq: Three times a day (TID) | ORAL | 0 refills | Status: DC

## 2017-07-29 NOTE — ED Triage Notes (Addendum)
Gabapentin was given to him was prescribed to him for headaches, per pt it is not working, per pt he stopped taking medication due to mood swings, fatigue. Per pt he was on 300 mg and he thinks he should have not stopped the meds and he would like to be put back on the meds and start back on 100 mg. Per pt he is having anxiety

## 2017-07-29 NOTE — ED Notes (Signed)
Informed Dr. Hagler with pt BP 

## 2017-08-01 ENCOUNTER — Encounter: Payer: Self-pay | Admitting: Emergency Medicine

## 2017-08-01 ENCOUNTER — Other Ambulatory Visit: Payer: Self-pay

## 2017-08-01 ENCOUNTER — Ambulatory Visit: Payer: Federal, State, Local not specified - PPO | Admitting: Emergency Medicine

## 2017-08-01 VITALS — BP 126/72 | HR 84 | Temp 98.8°F | Resp 16 | Ht 69.75 in | Wt 254.8 lb

## 2017-08-01 DIAGNOSIS — G4700 Insomnia, unspecified: Secondary | ICD-10-CM | POA: Diagnosis not present

## 2017-08-01 DIAGNOSIS — I10 Essential (primary) hypertension: Secondary | ICD-10-CM

## 2017-08-01 MED ORDER — TRAZODONE HCL 50 MG PO TABS: 25.0000 mg | ORAL_TABLET | Freq: Every evening | ORAL | 3 refills | Status: DC | PRN

## 2017-08-01 NOTE — Patient Instructions (Addendum)
   IF you received an x-ray today, you will receive an invoice from [facility]. Please contact [facility] at [phone] with questions or concerns regarding your invoice.   IF you received labwork today, you will receive an invoice from [facility]. Please contact [facility] at [phone] with questions or concerns regarding your invoice.   Our billing staff will not be able to assist you with questions regarding bills from these companies.  You will be contacted with the lab results as soon as they are available. The fastest way to get your results is to activate your My Chart account. Instructions are located on the last page of this paperwork. If you have not heard from us regarding the results in 2 weeks, please contact this office.    Insomnia Insomnia is a sleep disorder that makes it difficult to fall asleep or to stay asleep. Insomnia can cause tiredness (fatigue), low energy, difficulty concentrating, mood swings, and poor performance at work or school. There are three different ways to classify insomnia:  Difficulty falling asleep.  Difficulty staying asleep.  Waking up too early in the morning.  Any type of insomnia can be long-term (chronic) or short-term (acute). Both are common. Short-term insomnia usually lasts for three months or less. Chronic insomnia occurs at least three times a week for longer than three months. What are the causes? Insomnia may be caused by another condition, situation, or substance, such as:  Anxiety.  Certain medicines.  Gastroesophageal reflux disease (GERD) or other gastrointestinal conditions.  Asthma or other breathing conditions.  Restless legs syndrome, sleep apnea, or other sleep disorders.  Chronic pain.  Menopause. This may include hot flashes.  Stroke.  Abuse of alcohol, tobacco, or illegal drugs.  Depression.  Caffeine.  Neurological disorders, such as Alzheimer disease.  An overactive thyroid  (hyperthyroidism).  The cause of insomnia may not be known. What increases the risk? Risk factors for insomnia include:  Gender. Women are more commonly affected than men.  Age. Insomnia is more common as you get older.  Stress. This may involve your professional or personal life.  Income. Insomnia is more common in people with lower income.  Lack of exercise.  Irregular work schedule or night shifts.  Traveling between different time zones.  What are the signs or symptoms? If you have insomnia, trouble falling asleep or trouble staying asleep is the main symptom. This may lead to other symptoms, such as:  Feeling fatigued.  Feeling nervous about going to sleep.  Not feeling rested in the morning.  Having trouble concentrating.  Feeling irritable, anxious, or depressed.  How is this treated? Treatment for insomnia depends on the cause. If your insomnia is caused by an underlying condition, treatment will focus on addressing the condition. Treatment may also include:  Medicines to help you sleep.  Counseling or therapy.  Lifestyle adjustments.  Follow these instructions at home:  Take medicines only as directed by your health care provider.  Keep regular sleeping and waking hours. Avoid naps.  Keep a sleep diary to help you and your health care provider figure out what could be causing your insomnia. Include: ? When you sleep. ? When you wake up during the night. ? How well you sleep. ? How rested you feel the next day. ? Any side effects of medicines you are taking. ? What you eat and drink.  Make your bedroom a comfortable place where it is easy to fall asleep: ? Put up shades or special blackout   curtains to block light from outside. ? Use a white noise machine to block noise. ? Keep the temperature cool.  Exercise regularly as directed by your health care provider. Avoid exercising right before bedtime.  Use relaxation techniques to manage stress. Ask  your health care provider to suggest some techniques that may work well for you. These may include: ? Breathing exercises. ? Routines to release muscle tension. ? Visualizing peaceful scenes.  Cut back on alcohol, caffeinated beverages, and cigarettes, especially close to bedtime. These can disrupt your sleep.  Do not overeat or eat spicy foods right before bedtime. This can lead to digestive discomfort that can make it hard for you to sleep.  Limit screen use before bedtime. This includes: ? Watching TV. ? Using your smartphone, tablet, and computer.  Stick to a routine. This can help you fall asleep faster. Try to do a quiet activity, brush your teeth, and go to bed at the same time each night.  Get out of bed if you are still awake after 15 minutes of trying to sleep. Keep the lights down, but try reading or doing a quiet activity. When you feel sleepy, go back to bed.  Make sure that you drive carefully. Avoid driving if you feel very sleepy.  Keep all follow-up appointments as directed by your health care provider. This is important. Contact a health care provider if:  You are tired throughout the day or have trouble in your daily routine due to sleepiness.  You continue to have sleep problems or your sleep problems get worse. Get help right away if:  You have serious thoughts about hurting yourself or someone else. This information is not intended to replace advice given to you by your health care provider. Make sure you discuss any questions you have with your health care provider. Document Released: 06/24/2000 Document Revised: 11/27/2015 Document Reviewed: 03/28/2014 Elsevier Interactive Patient Education  2018 [employer] Inc.  

## 2017-08-01 NOTE — Progress Notes (Signed)
Dylan Washington 57 y.o.   Chief Complaint  Patient presents with  . MEDICATION REVIEW    TO DISCUSS  GABAPENTIN    HISTORY OF PRESENT ILLNESS: This is a 57 y.o. male complaining of difficulty sleeping; just got off Gabapentin, prescribed last year by Neurologist to treat headaches. 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 At times feels depressed. Not suicidal.   HPI   Prior to Admission medications   Medication Sig Start Date End Date Taking? Authorizing Provider  amLODipine (NORVASC) 10 MG tablet Take 1 tablet (10 mg total) by mouth daily. 12/22/16  Yes Dylan Bosworth, MD  aspirin EC 81 MG tablet Take 1 tablet (81 mg total) by mouth daily. 12/22/16  Yes Washington, Dylan A, MD  cloNIDine (CATAPRES - DOSED IN MG/24 HR) 0.1 mg/24hr patch APPLY 1 PATCH(0.1 MG) EXTERNALLY TO THE SKIN 1 TIME A WEEK 07/07/17  Yes Dylan Washington, Dylan Kempf, MD  fluticasone [facility]) 50 MCG/ACT nasal spray Place 2 sprays into both nostrils daily. 10/22/16  Yes Dylan Sidle, MD  gabapentin (NEURONTIN) 100 MG capsule Take 1 capsule (100 mg total) by mouth 3 (three) times daily. 07/29/17  Yes Washington, Dylan John, MD  hydrALAZINE (APRESOLINE) 50 MG tablet TAKE 1 TABLET(50 MG) BY MOUTH THREE TIMES DAILY 07/13/17  Yes Washington, Dylan A, MD  losartan-hydrochlorothiazide (HYZAAR) 100-25 MG tablet Take 1 tablet by mouth daily. 07/20/16  Yes Dylan Washington, Dylan Kempf, MD  Multiple Vitamins-Minerals (ADEKS) chewable tablet Chew 1 tablet by mouth daily.   Yes [provider]  ipratropium (ATROVENT) 0.06 % nasal spray Place 2 sprays into both nostrils 4 (four) times daily. Patient not taking: Reported on 08/01/2017 12/27/16   Dylan Canter, FNP  tadalafil (CIALIS) 5 MG tablet Take 1 tablet (5 mg total) by mouth daily as needed for erectile dysfunction. Patient not taking: Reported on 08/01/2017 11/22/16   Dylan Bosworth, MD    No  Known Allergies  Patient Active Problem List   Diagnosis Date Noted  . Strain of lumbar region 04/14/2017  . Acute midline low back pain without sciatica 04/14/2017  . Chronic tension-type headache, not intractable 11/14/2016  . Bone spur of foot 02/08/2016  . RBBB (right bundle branch block) 04/02/2014  . Asthma 01/17/2012  . Environmental allergies 08/16/2011  . Hypertension   . Sleep apnea     Past Medical History:  Diagnosis Date  . Allergy    allergy shots at [facility]  . Asthma   . Hypertension   . Sleep apnea     Past Surgical History:  Procedure Laterality Date  . ACHILLES TENDON REPAIR    . NASAL SINUS SURGERY      Social History   Socioeconomic History  . Marital status: Single    Spouse name: Not on file  . Number of children: 0  . Years of education: college  . Highest education level: Not on file  Social Needs  . Financial resource strain: Not on file  . Food insecurity - worry: Not on file  . Food insecurity - inability: Not on file  . Transportation needs - medical: Not on file  . Transportation needs - non-medical: Not on file  Occupational History    Employer: [employer]  Tobacco Use  . Smoking status: Former Smoker    Last attempt to quit: 06/30/2017    Years since quitting: 0.0  . Smokeless  tobacco: Never Used  . Tobacco comment: 3 cig a day   Substance and Sexual Activity  . Alcohol use: No    Alcohol/week: 0.0 oz  . Drug use: No  . Sexual activity: Yes  Other Topics Concern  . Not on file  Social History Narrative   Marital status: married;       Children: none      Lives: with wife.      Employment: works at [employer]; Solicitor.      Tobacco: none      Alcohol: none      Drugs: none      Exercise: gym 4-5 days per week.   Denies  Caffeine use     Family History  Problem Relation Age of Onset  . Hypertension Mother   . Cancer Mother   . Hypertension Father      Review of Systems  Constitutional:  Negative.  Negative for chills and fever.  HENT: Negative.  Negative for congestion, nosebleeds and sore throat.   Eyes: Negative.  Negative for blurred vision and double vision.  Respiratory: Negative.  Negative for cough, shortness of breath and wheezing.   Cardiovascular: Negative.  Negative for chest pain and palpitations.  Gastrointestinal: Negative.  Negative for abdominal pain, diarrhea, nausea and vomiting.  Genitourinary: Negative.  Negative for dysuria and hematuria.  Musculoskeletal: Negative.  Negative for back pain, myalgias and neck pain.  Skin: Negative.  Negative for rash.  Neurological: Negative.  Negative for dizziness, focal weakness, seizures, loss of consciousness and headaches.  Endo/Heme/Allergies: Negative.   Psychiatric/Behavioral: Positive for depression. Negative for substance abuse and suicidal ideas. The patient is nervous/anxious and has insomnia.   All other systems reviewed and are negative.  Vitals:   08/01/17 0947  BP: 126/72  Pulse: 84  Resp: 16  Temp: 98.8 F (37.1 C)  SpO2: 100%     Physical Exam  Constitutional: He is oriented to person, place, and time. He appears well-developed and well-nourished.  HENT:  Head: Normocephalic and atraumatic.  Eyes: EOM are normal. Pupils are equal, round, and reactive to light.  Neck: Normal range of motion. Neck supple.  Cardiovascular: Normal rate, regular rhythm and normal heart sounds.  Pulmonary/Chest: Effort normal and breath sounds normal.  Musculoskeletal: Normal range of motion.  Neurological: He is alert and oriented to person, place, and time. No sensory deficit. He exhibits normal muscle tone.  Skin: Skin is warm and dry. Capillary refill takes less than 2 seconds.  Psychiatric: He has a normal mood and affect. His behavior is normal.  Vitals reviewed.   A total of 25 minutes was spent in the room with the patient, greater than 50% of which was in counseling/coordination of care.  ASSESSMENT  & PLAN: Dylan Washington was seen today for medication review.  Diagnoses and all orders for this visit:  Insomnia, unspecified type -     traZODone (DESYREL) 50 MG tablet; Take 0.5-1 tablets (25-50 mg total) by mouth at bedtime as needed for sleep.  Essential hypertension   Patient Instructions       IF you received an x-ray today, you will receive an invoice from [facility]. Please contact [facility] at [phone] with questions or concerns regarding your invoice.   IF you received labwork today, you will receive an invoice from [facility]. Please contact [facility] at [phone] with questions or concerns regarding your invoice.   Our billing staff will not be able to assist you with questions regarding bills  from these companies.  You will be contacted with the lab results as soon as they are available. The fastest way to get your results is to activate your My Chart account. Instructions are located on the last page of this paperwork. If you have not heard from us regarding the results in 2 weeks, please contact this office.    Insomnia Insomnia is a sleep disorder that makes it difficult to fall asleep or to stay asleep. Insomnia can cause tiredness (fatigue), low energy, difficulty concentrating, mood swings, and poor performance at work or school. There are three different ways to classify insomnia:  Difficulty falling asleep.  Difficulty staying asleep.  Waking up too early in the morning.  Any type of insomnia can be long-term (chronic) or short-term (acute). Both are common. Short-term insomnia usually lasts for three months or less. Chronic insomnia occurs at least three times a week for longer than three months. What are the causes? Insomnia may be caused by another condition, situation, or substance, such as:  Anxiety.  Certain medicines.  Gastroesophageal reflux disease (GERD) or other gastrointestinal conditions.  Asthma or other breathing  conditions.  Restless legs syndrome, sleep apnea, or other sleep disorders.  Chronic pain.  Menopause. This may include hot flashes.  Stroke.  Abuse of alcohol, tobacco, or illegal drugs.  Depression.  Caffeine.  Neurological disorders, such as Alzheimer disease.  An overactive thyroid (hyperthyroidism).  The cause of insomnia may not be known. What increases the risk? Risk factors for insomnia include:  Gender. Women are more commonly affected than men.  Age. Insomnia is more common as you get older.  Stress. This may involve your professional or personal life.  Income. Insomnia is more common in people with lower income.  Lack of exercise.  Irregular work schedule or night shifts.  Traveling between different time zones.  What are the signs or symptoms? If you have insomnia, trouble falling asleep or trouble staying asleep is the main symptom. This may lead to other symptoms, such as:  Feeling fatigued.  Feeling nervous about going to sleep.  Not feeling rested in the morning.  Having trouble concentrating.  Feeling irritable, anxious, or depressed.  How is this treated? Treatment for insomnia depends on the cause. If your insomnia is caused by an underlying condition, treatment will focus on addressing the condition. Treatment may also include:  Medicines to help you sleep.  Counseling or therapy.  Lifestyle adjustments.  Follow these instructions at home:  Take medicines only as directed by your health care provider.  Keep regular sleeping and waking hours. Avoid naps.  Keep a sleep diary to help you and your health care provider figure out what could be causing your insomnia. Include: ? When you sleep. ? When you wake up during the night. ? How well you sleep. ? How rested you feel the next day. ? Any side effects of medicines you are taking. ? What you eat and drink.  Make your bedroom a comfortable place where it is easy to fall  asleep: ? Put up shades or special blackout curtains to block light from outside. ? Use a white noise machine to block noise. ? Keep the temperature cool.  Exercise regularly as directed by your health care provider. Avoid exercising right before bedtime.  Use relaxation techniques to manage stress. Ask your health care provider to suggest some techniques that may work well for you. These may include: ? Breathing exercises. ? Routines to release muscle tension. ? Visualizing  peaceful scenes.  Cut back on alcohol, caffeinated beverages, and cigarettes, especially close to bedtime. These can disrupt your sleep.  Do not overeat or eat spicy foods right before bedtime. This can lead to digestive discomfort that can make it hard for you to sleep.  Limit screen use before bedtime. This includes: ? Watching TV. ? Using your smartphone, tablet, and computer.  Stick to a routine. This can help you fall asleep faster. Try to do a quiet activity, brush your teeth, and go to bed at the same time each night.  Get out of bed if you are still awake after 15 minutes of trying to sleep. Keep the lights down, but try reading or doing a quiet activity. When you feel sleepy, go back to bed.  Make sure that you drive carefully. Avoid driving if you feel very sleepy.  Keep all follow-up appointments as directed by your health care provider. This is important. Contact a health care provider if:  You are tired throughout the day or have trouble in your daily routine due to sleepiness.  You continue to have sleep problems or your sleep problems get worse. Get help right away if:  You have serious thoughts about hurting yourself or someone else. This information is not intended to replace advice given to you by your health care provider. Make sure you discuss any questions you have with your health care provider. Document Released: 06/24/2000 Document Revised: 11/27/2015 Document Reviewed:  03/28/2014 Elsevier Interactive Patient Education  2018 Elsevier Inc.      Edwina Barth, MD [facility]

## 2017-08-03 NOTE — ED Provider Notes (Signed)
[facility]   [id number] 07/29/17 Arrival Time: 1203  ASSESSMENT & PLAN:  1. Medication management   2. Essential hypertension   3. Frequent headaches     Meds ordered this encounter  Medications  . gabapentin (NEURONTIN) 100 MG capsule    Sig: Take 1 capsule (100 mg total) by mouth 3 (three) times daily.    Dispense:  90 capsule    Refill:  0   Will schedule f/u with his PCP for further refills and medication management and to recheck BP. Reviewed expectations re: course of current medical issues. Questions answered. Outlined signs and symptoms indicating need for more acute intervention. Patient verbalized understanding. After Visit Summary given.   SUBJECTIVE: History from: patient. Dylan Washington is a 57 y.o. male who presents requesting a refill of his Gabapentin. Initially Rx for headaches. Self discontinued questioning side effects but now not sure effects were from medication. Desires to begin again and f/u with his PCP. No recent exacerbation of his headaches.  Notice increased BP today.  ROS: As per HPI.   OBJECTIVE:  Vitals:   07/29/17 1218  BP: (!) 189/117  Pulse: 79  Resp: 18  Temp: 97.8 F (36.6 C)  TempSrc: Oral  SpO2: 97%    General appearance: alert; no distress Eyes: PERRLA; EOMI; conjunctiva normal HENT: normocephalic; atraumatic; TMs normal; nasal mucosa normal; oral mucosa normal Neck: supple  Lungs: clear to auscultation bilaterally Heart: regular rate and rhythm Abdomen: soft, non-tender; bowel sounds normal; no masses or organomegaly; no guarding or rebound tenderness Back: no CVA tenderness Extremities: no cyanosis or edema; symmetrical with no gross deformities Skin: warm and dry Neurologic: normal gait; normal symmetric reflexes Psychological: alert and cooperative; normal mood and affect    No Known Allergies  Past Medical History:  Diagnosis Date  . Allergy    [facility]  . Asthma   .  Hypertension   . Sleep apnea    Social History   Socioeconomic History  . Marital status: Single    Spouse name: Not on file  . Number of children: 0  . Years of education: college  . Highest education level: Not on file  Social Needs  . Financial resource strain: Not on file  . Food insecurity - worry: Not on file  . Food insecurity - inability: Not on file  . Transportation needs - medical: Not on file  . Transportation needs - non-medical: Not on file  Occupational History    Employer: [employer]  Tobacco Use  . Smoking status: Former Smoker    Last attempt to quit: 06/30/2017    Years since quitting: 0.0  . Smokeless tobacco: Never Used  . Tobacco comment: 3 cig a day   Substance and Sexual Activity  . Alcohol use: No    Alcohol/week: 0.0 oz  . Drug use: No  . Sexual activity: Yes  Other Topics Concern  . Not on file  Social History Narrative   Marital status: married;       Children: none      Lives: with wife.      Employment: works at [employer]; Solicitorclerk.      Tobacco: none      Alcohol: none      Drugs: none      Exercise: gym 4-5 days per week.   Denies  Caffeine use    Family History  Problem Relation Age of Onset  . Hypertension Mother   . Cancer  Mother   . Hypertension Father    Past Surgical History:  Procedure Laterality Date  . ACHILLES TENDON REPAIR    . NASAL SINUS SURGERY       Mardella Layman, MD 08/03/17 [phone]

## 2017-08-07 ENCOUNTER — Other Ambulatory Visit: Payer: Self-pay | Admitting: Family Medicine

## 2017-08-16 ENCOUNTER — Other Ambulatory Visit: Payer: Self-pay

## 2017-08-16 ENCOUNTER — Encounter: Payer: Self-pay | Admitting: Emergency Medicine

## 2017-08-16 ENCOUNTER — Ambulatory Visit: Payer: Federal, State, Local not specified - PPO | Admitting: Emergency Medicine

## 2017-08-16 VITALS — BP 140/78 | HR 93 | Temp 98.0°F | Resp 16 | Wt 254.6 lb

## 2017-08-16 DIAGNOSIS — Z87898 Personal history of other specified conditions: Secondary | ICD-10-CM | POA: Insufficient documentation

## 2017-08-16 DIAGNOSIS — Z8659 Personal history of other mental and behavioral disorders: Secondary | ICD-10-CM | POA: Insufficient documentation

## 2017-08-16 DIAGNOSIS — R0981 Nasal congestion: Secondary | ICD-10-CM | POA: Diagnosis not present

## 2017-08-16 NOTE — Progress Notes (Signed)
Dylan Washington 57 y.o.   Chief Complaint  Patient presents with  . Nasal Congestion    x 1 week, also per patient he has a question to ask did not say    HISTORY OF PRESENT ILLNESS: This is a 57 y.o. male seen by me on January 22.  Started on trazodone to help with both insomnia and depression.  Patient states it is working well.  So far satisfied with the results.  However has missed several days of work and needs a doctor's note.  Nasal congestion is a chronic intermittent problem.  Has seen different doctors about this at some point was referred to ENT.  No new changes with that.  HPI   Prior to Admission medications   Medication Sig Start Date End Date Taking? Authorizing Provider  amLODipine (NORVASC) 10 MG tablet Take 1 tablet (10 mg total) by mouth daily. 12/22/16  Yes Doristine Bosworth, MD  aspirin EC 81 MG tablet Take 1 tablet (81 mg total) by mouth daily. 12/22/16  Yes Stallings, Zoe A, MD  cloNIDine (CATAPRES - DOSED IN MG/24 HR) 0.1 mg/24hr patch APPLY 1 PATCH(0.1 MG) EXTERNALLY TO THE SKIN 1 TIME A WEEK 07/07/17  Yes Elva Mauro, Eilleen Kempf, MD  fluticasone [facility]) 50 MCG/ACT nasal spray Place 2 sprays into both nostrils daily. 10/22/16  Yes Elvina Sidle, MD  hydrALAZINE (APRESOLINE) 50 MG tablet TAKE 1 TABLET BY MOUTH THREE TIMES DAILY 08/08/17  Yes Akiba Melfi, Eilleen Kempf, MD  ipratropium (ATROVENT) 0.06 % nasal spray Place 2 sprays into both nostrils 4 (four) times daily. 12/27/16  Yes Deatra Canter, FNP  losartan-hydrochlorothiazide (HYZAAR) 100-25 MG tablet Take 1 tablet by mouth daily. 07/20/16  Yes Christe Tellez, Eilleen Kempf, MD  Multiple Vitamins-Minerals (ADEKS) chewable tablet Chew 1 tablet by mouth daily.   Yes [provider]  traZODone (DESYREL) 50 MG tablet Take 0.5-1 tablets (25-50 mg total) by mouth at bedtime as needed for sleep. 08/01/17  Yes Mikael Debell, Eilleen Kempf, MD  gabapentin (NEURONTIN) 100 MG capsule Take 1 capsule (100 mg total) by mouth 3 (three)  times daily. Patient not taking: Reported on 08/16/2017 07/29/17   Mardella Layman, MD  tadalafil (CIALIS) 5 MG tablet Take 1 tablet (5 mg total) by mouth daily as needed for erectile dysfunction. Patient not taking: Reported on 08/01/2017 11/22/16   Doristine Bosworth, MD    No Known Allergies  Patient Active Problem List   Diagnosis Date Noted  . Insomnia 08/01/2017  . Strain of lumbar region 04/14/2017  . Acute midline low back pain without sciatica 04/14/2017  . Chronic tension-type headache, not intractable 11/14/2016  . Bone spur of foot 02/08/2016  . RBBB (right bundle branch block) 04/02/2014  . Asthma 01/17/2012  . Environmental allergies 08/16/2011  . Hypertension   . Sleep apnea     Past Medical History:  Diagnosis Date  . Allergy    allergy shots at [facility]  . Asthma   . Hypertension   . Sleep apnea     Past Surgical History:  Procedure Laterality Date  . ACHILLES TENDON REPAIR    . NASAL SINUS SURGERY      Social History   Socioeconomic History  . Marital status: Single    Spouse name: Not on file  . Number of children: 0  . Years of education: college  . Highest education level: Not on file  Social Needs  . Financial resource strain: Not on file  . Food insecurity - worry: Not  on file  . Food insecurity - inability: Not on file  . Transportation needs - medical: Not on file  . Transportation needs - non-medical: Not on file  Occupational History    Employer: [employer]  Tobacco Use  . Smoking status: Former Smoker    Last attempt to quit: 06/30/2017    Years since quitting: 0.1  . Smokeless tobacco: Never Used  . Tobacco comment: 3 cig a day   Substance and Sexual Activity  . Alcohol use: No    Alcohol/week: 0.0 oz  . Drug use: No  . Sexual activity: Yes  Other Topics Concern  . Not on file  Social History Narrative   Marital status: married;       Children: none      Lives: with wife.      Employment: works at [employer];  Solicitorclerk.      Tobacco: none      Alcohol: none      Drugs: none      Exercise: gym 4-5 days per week.   Denies  Caffeine use     Family History  Problem Relation Age of Onset  . Hypertension Mother   . Cancer Mother   . Hypertension Father      Review of Systems  Constitutional: Negative.  Negative for chills and fever.  HENT: Positive for congestion. Negative for nosebleeds, sinus pain and sore throat.   Respiratory: Negative for cough and shortness of breath.   Cardiovascular: Negative for chest pain and palpitations.  Gastrointestinal: Negative for abdominal pain, nausea and vomiting.  Genitourinary: Negative for dysuria and hematuria.  Skin: Negative for rash.  Endo/Heme/Allergies: Negative.   Psychiatric/Behavioral: The patient has insomnia.   All other systems reviewed and are negative.   Vitals:   08/16/17 0850  BP: 140/78  Pulse: 93  Resp: 16  Temp: 98 F (36.7 C)  SpO2: 98%    Physical Exam  Constitutional: He is oriented to person, place, and time. He appears well-developed and well-nourished.  HENT:  Head: Normocephalic.  Eyes: EOM are normal. Pupils are equal, round, and reactive to light.  Neck: Normal range of motion. Neck supple.  Cardiovascular: Normal rate and regular rhythm.  Pulmonary/Chest: Effort normal.  Musculoskeletal: Normal range of motion.  Neurological: He is alert and oriented to person, place, and time. No sensory deficit. He exhibits normal muscle tone.  Skin: Skin is warm and dry. Capillary refill takes less than 2 seconds.  Psychiatric: He has a normal mood and affect. His behavior is normal.  Vitals reviewed.    ASSESSMENT & PLAN: Dylan Washington was seen today for nasal congestion.  Diagnoses and all orders for this visit:  Chronic nasal congestion  History of depression  History of insomnia   Work note provided.  Patient Instructions       IF you received an x-ray today, you will receive an invoice from [facility]. Please contact [facility] at [phone] with questions or concerns regarding your invoice.   IF you received labwork today, you will receive an invoice from [facility]. Please contact [facility] at [phone] with questions or concerns regarding your invoice.   Our billing staff will not be able to assist you with questions regarding bills from these companies.  You will be contacted with the lab results as soon as they are available. The fastest way to get your results is to activate your My Chart account. Instructions are located on the last page of this paperwork.  If you have not heard from [place] regarding the results in 2 weeks, please contact this office.     Health Maintenance, Male A healthy lifestyle and preventive care is important for your health and wellness. Ask your health care provider about what schedule of regular examinations is right for you. What should I know about weight and diet? Eat a Healthy Diet  Eat plenty of vegetables, fruits, whole grains, low-fat dairy products, and lean protein.  Do not eat a lot of foods high in solid fats, added sugars, or salt.  Maintain a Healthy Weight Regular exercise can help you achieve or maintain a healthy weight. You should:  Do at least 150 minutes of exercise each week. The exercise should increase your heart rate and make you sweat (moderate-intensity exercise).  Do strength-training exercises at least twice a week.  Watch Your Levels of Cholesterol and Blood Lipids  Have your blood tested for lipids and cholesterol every 5 years starting at 55 years of age. If you are at high risk for heart disease, you should start having your blood tested when you are 55 years old. You may need to have your cholesterol levels checked more often if: ? Your lipid or cholesterol levels are high. ? You are older than 55 years of age. ? You are at high risk for heart disease.  What should I know about cancer  screening? Many types of cancers can be detected early and may often be prevented. Lung Cancer  You should be screened every year for lung cancer if: ? You are a current smoker who has smoked for at least 30 years. ? You are a former smoker who has quit within the past 15 years.  Talk to your health care provider about your screening options, when you should start screening, and how often you should be screened.  Colorectal Cancer  Routine colorectal cancer screening usually begins at 55 years of age and should be repeated every 5-10 years until you are 55 years old. You may need to be screened more often if early forms of precancerous polyps or small growths are found. Your health care provider may recommend screening at an earlier age if you have risk factors for colon cancer.  Your health care provider may recommend using home test kits to check for hidden blood in the stool.  A small camera at the end of a tube can be used to examine your colon (sigmoidoscopy or colonoscopy). This checks for the earliest forms of colorectal cancer.  Prostate and Testicular Cancer  Depending on your age and overall health, your health care provider may do certain tests to screen for prostate and testicular cancer.  Talk to your health care provider about any symptoms or concerns you have about testicular or prostate cancer.  Skin Cancer  Check your skin from head to toe regularly.  Tell your health care provider about any new moles or changes in moles, especially if: ? There is a change in a mole's size, shape, or color. ? You have a mole that is larger than a pencil eraser.  Always use sunscreen. Apply sunscreen liberally and repeat throughout the day.  Protect yourself by wearing long sleeves, pants, a wide-brimmed hat, and sunglasses when outside.  What should I know about heart disease, diabetes, and high blood pressure?  If you are 74-54 years of age, have your blood pressure checked  every 3-5 years. If you are 25 years of age or older, have your blood pressure  checked every year. You should have your blood pressure measured twice-once when you are at a hospital or clinic, and once when you are not at a hospital or clinic. Record the average of the two measurements. To check your blood pressure when you are not at a hospital or clinic, you can use: ? An automated blood pressure machine at a pharmacy. ? A home blood pressure monitor.  Talk to your health care provider about your target blood pressure.  If you are between 17-75 years old, ask your health care provider if you should take aspirin to prevent heart disease.  Have regular diabetes screenings by checking your fasting blood sugar level. ? If you are at a normal weight and have a low risk for diabetes, have this test once every three years after the age of 39. ? If you are overweight and have a high risk for diabetes, consider being tested at a younger age or more often.  A one-time screening for abdominal aortic aneurysm (AAA) by ultrasound is recommended for men aged 65-75 years who are current or former smokers. What should I know about preventing infection? Hepatitis B If you have a higher risk for hepatitis B, you should be screened for this virus. Talk with your health care provider to find out if you are at risk for hepatitis B infection. Hepatitis C Blood testing is recommended for:  Everyone born from [birth year] through 1965.  Anyone with known risk factors for hepatitis C.  Sexually Transmitted Diseases (STDs)  You should be screened each year for STDs including gonorrhea and chlamydia if: ? You are sexually active and are younger than 55 years of age. ? You are older than 55 years of age and your health care provider tells you that you are at risk for this type of infection. ? Your sexual activity has changed since you were last screened and you are at an increased risk for chlamydia or gonorrhea. Ask your  health care provider if you are at risk.  Talk with your health care provider about whether you are at high risk of being infected with HIV. Your health care provider may recommend a prescription medicine to help prevent HIV infection.  What else can I do?  Schedule regular health, dental, and eye exams.  Stay current with your vaccines (immunizations).  Do not use any tobacco products, such as cigarettes, chewing tobacco, and e-cigarettes. If you need help quitting, ask your health care provider.  Limit alcohol intake to no more than 2 drinks per day. One drink equals 12 ounces of beer, 5 ounces of wine, or 1 ounces of hard liquor.  Do not use street drugs.  Do not share needles.  Ask your health care provider for help if you need support or information about quitting drugs.  Tell your health care provider if you often feel depressed.  Tell your health care provider if you have ever been abused or do not feel safe at home. This information is not intended to replace advice given to you by your health care provider. Make sure you discuss any questions you have with your health care provider. Document Released: 12/24/2007 Document Revised: 02/24/2016 Document Reviewed: 03/31/2015 Elsevier Interactive Patient Education  2018 Elsevier Inc.      Edwina Barth, MD [facility]

## 2017-08-16 NOTE — Patient Instructions (Addendum)
   IF you received an x-ray today, you will receive an invoice from [facility]. Please contact [facility] at [phone] with questions or concerns regarding your invoice.   IF you received labwork today, you will receive an invoice from [facility]. Please contact [facility] at [phone] with questions or concerns regarding your invoice.   Our billing staff will not be able to assist you with questions regarding bills from these companies.  You will be contacted with the lab results as soon as they are available. The fastest way to get your results is to activate your My Chart account. Instructions are located on the last page of this paperwork. If you have not heard from us regarding the results in 2 weeks, please contact this office.      Health Maintenance, Male A healthy lifestyle and preventive care is important for your health and wellness. Ask your health care provider about what schedule of regular examinations is right for you. What should I know about weight and diet? Eat a Healthy Diet  Eat plenty of vegetables, fruits, whole grains, low-fat dairy products, and lean protein.  Do not eat a lot of foods high in solid fats, added sugars, or salt.  Maintain a Healthy Weight Regular exercise can help you achieve or maintain a healthy weight. You should:  Do at least 150 minutes of exercise each week. The exercise should increase your heart rate and make you sweat (moderate-intensity exercise).  Do strength-training exercises at least twice a week.  Watch Your Levels of Cholesterol and Blood Lipids  Have your blood tested for lipids and cholesterol every 5 years starting at 55 years of age. If you are at high risk for heart disease, you should start having your blood tested when you are 55 years old. You may need to have your cholesterol levels checked more often if: ? Your lipid or cholesterol levels are high. ? You are older than 55 years of age. ? You  are at high risk for heart disease.  What should I know about cancer screening? Many types of cancers can be detected early and may often be prevented. Lung Cancer  You should be screened every year for lung cancer if: ? You are a current smoker who has smoked for at least 30 years. ? You are a former smoker who has quit within the past 15 years.  Talk to your health care provider about your screening options, when you should start screening, and how often you should be screened.  Colorectal Cancer  Routine colorectal cancer screening usually begins at 55 years of age and should be repeated every 5-10 years until you are 55 years old. You may need to be screened more often if early forms of precancerous polyps or small growths are found. Your health care provider may recommend screening at an earlier age if you have risk factors for colon cancer.  Your health care provider may recommend using home test kits to check for hidden blood in the stool.  A small camera at the end of a tube can be used to examine your colon (sigmoidoscopy or colonoscopy). This checks for the earliest forms of colorectal cancer.  Prostate and Testicular Cancer  Depending on your age and overall health, your health care provider may do certain tests to screen for prostate and testicular cancer.  Talk to your health care provider about any symptoms or concerns you have about testicular or prostate cancer.  Skin Cancer  Check your skin   from head to toe regularly.  Tell your health care provider about any new moles or changes in moles, especially if: ? There is a change in a mole's size, shape, or color. ? You have a mole that is larger than a pencil eraser.  Always use sunscreen. Apply sunscreen liberally and repeat throughout the day.  Protect yourself by wearing long sleeves, pants, a wide-brimmed hat, and sunglasses when outside.  What should I know about heart disease, diabetes, and high blood  pressure?  If you are 18-39 years of age, have your blood pressure checked every 3-5 years. If you are 40 years of age or older, have your blood pressure checked every year. You should have your blood pressure measured twice-once when you are at a hospital or clinic, and once when you are not at a hospital or clinic. Record the average of the two measurements. To check your blood pressure when you are not at a hospital or clinic, you can use: ? An automated blood pressure machine at a pharmacy. ? A home blood pressure monitor.  Talk to your health care provider about your target blood pressure.  If you are between 45-79 years old, ask your health care provider if you should take aspirin to prevent heart disease.  Have regular diabetes screenings by checking your fasting blood sugar level. ? If you are at a normal weight and have a low risk for diabetes, have this test once every three years after the age of 45. ? If you are overweight and have a high risk for diabetes, consider being tested at a younger age or more often.  A one-time screening for abdominal aortic aneurysm (AAA) by ultrasound is recommended for men aged 65-75 years who are current or former smokers. What should I know about preventing infection? Hepatitis B If you have a higher risk for hepatitis B, you should be screened for this virus. Talk with your health care provider to find out if you are at risk for hepatitis B infection. Hepatitis C Blood testing is recommended for:  Everyone born from 1945 through 1965.  Anyone with known risk factors for hepatitis C.  Sexually Transmitted Diseases (STDs)  You should be screened each year for STDs including gonorrhea and chlamydia if: ? You are sexually active and are younger than 55 years of age. ? You are older than 55 years of age and your health care provider tells you that you are at risk for this type of infection. ? Your sexual activity has changed since you were last  screened and you are at an increased risk for chlamydia or gonorrhea. Ask your health care provider if you are at risk.  Talk with your health care provider about whether you are at high risk of being infected with HIV. Your health care provider may recommend a prescription medicine to help prevent HIV infection.  What else can I do?  Schedule regular health, dental, and eye exams.  Stay current with your vaccines (immunizations).  Do not use any tobacco products, such as cigarettes, chewing tobacco, and e-cigarettes. If you need help quitting, ask your health care provider.  Limit alcohol intake to no more than 2 drinks per day. One drink equals 12 ounces of beer, 5 ounces of wine, or 1 ounces of hard liquor.  Do not use street drugs.  Do not share needles.  Ask your health care provider for help if you need support or information about quitting drugs.  Tell your health care   provider if you often feel depressed.  Tell your health care provider if you have ever been abused or do not feel safe at home. This information is not intended to replace advice given to you by your health care provider. Make sure you discuss any questions you have with your health care provider. Document Released: 12/24/2007 Document Revised: 02/24/2016 Document Reviewed: 03/31/2015 Elsevier Interactive Patient Education  2018 Elsevier Inc.  

## 2017-08-26 IMAGING — MR MR HEAD W/O CM
8 of 10 series · 39 of 48 positions shown · non-contrast
Comparison: Head CT 10/21/2016 and 11/24/2010 sinus CT

CLINICAL DATA: Intermittent occipital headaches over the last
several months.

EXAM:
MRI HEAD WITHOUT CONTRAST
TECHNIQUE: Multiplanar, multiecho pulse sequences of the brain and surrounding
structures were obtained without intravenous contrast.

[Series 3: T1 · sagittal · 5.0mm · 0.47mm/px · 2 of 24 slices shown]
[im 1/24]
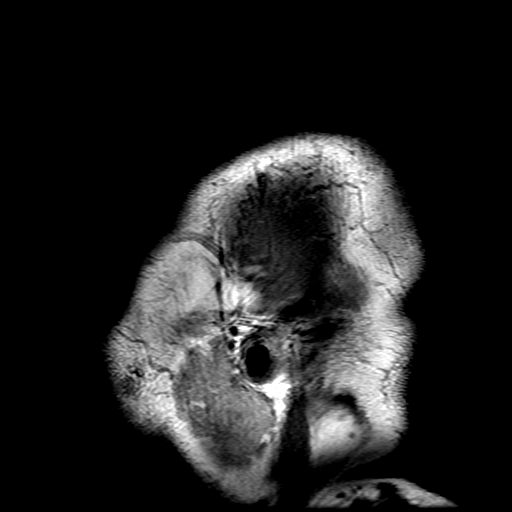
[im 24/24]
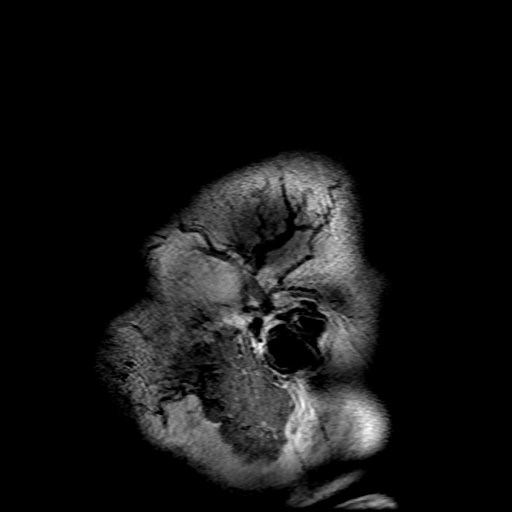

[Series 4: DWI · axial · 3.0mm · 1.09mm/px · z∈[-51,+103]mm · 11 of 106 slices shown (1 of 4)]
[im 1/106]
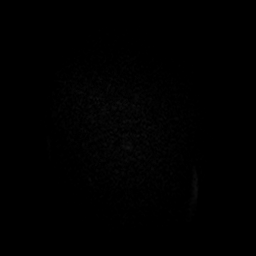
[im 11/106]
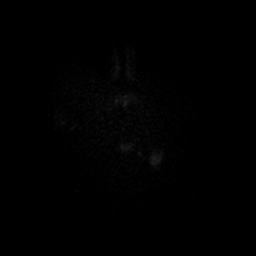
[im 22/106]
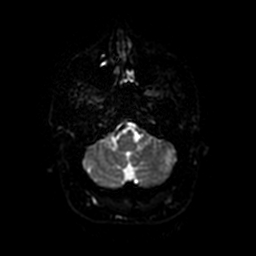
[im 32/106]
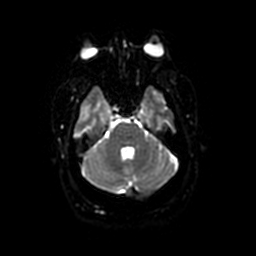
[im 43/106]
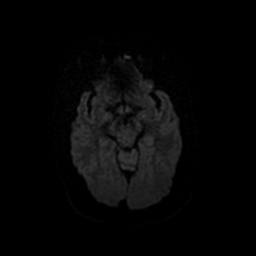
[im 53/106]
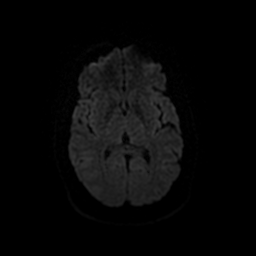
[im 64/106]
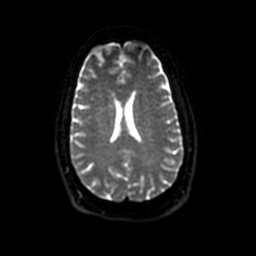
[im 74/106]
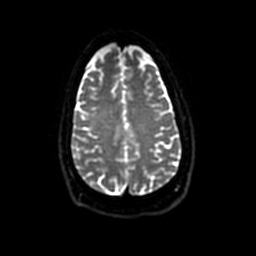
[im 85/106]
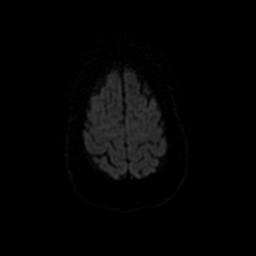
[im 95/106]
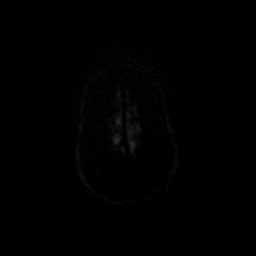
[im 106/106]
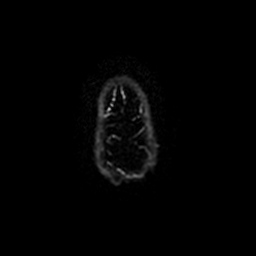

[Series 5: DWI · coronal · 5.0mm · 1.09mm/px · 7 of 70 slices shown (2 of 4)]
[im 1/70]
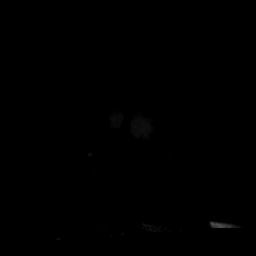
[im 12/70]
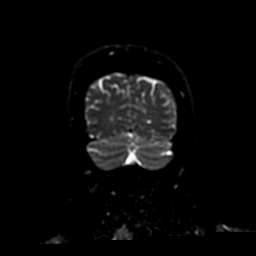
[im 24/70]
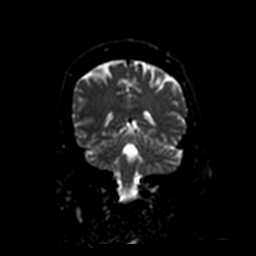
[im 35/70]
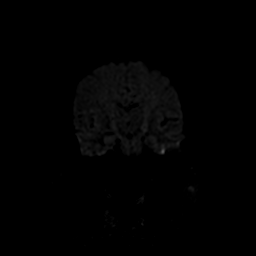
[im 47/70]
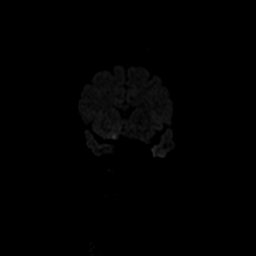
[im 58/70]
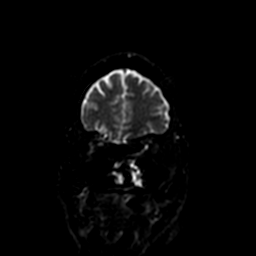
[im 70/70]
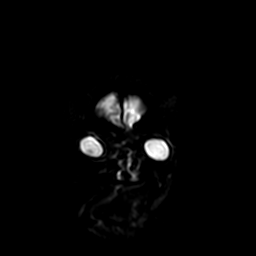

[Series 6: T2 · axial · 5.0mm · 0.43mm/px · z∈[-55,+99]mm · 3 of 27 slices shown (1 of 2)]
[im 1/27]
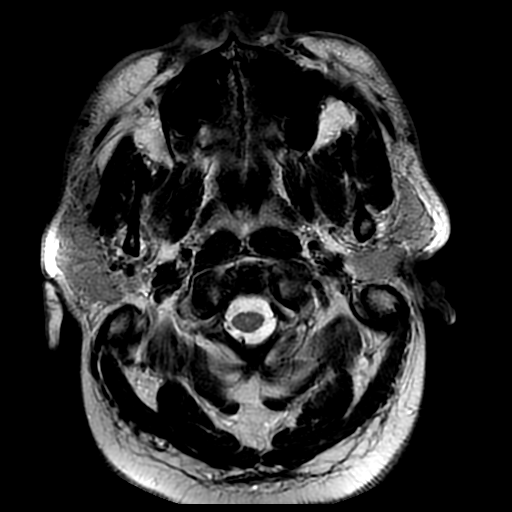
[im 14/27]
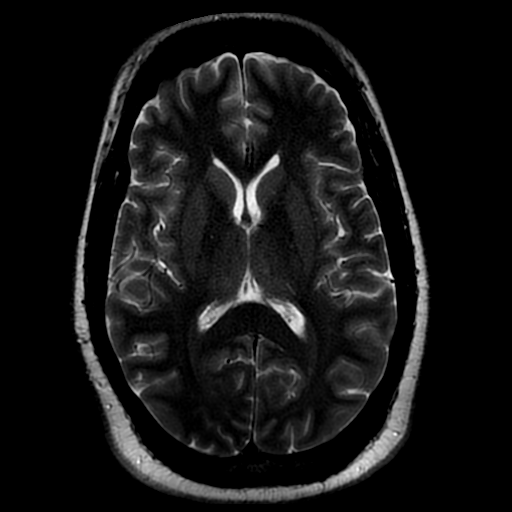
[im 27/27]
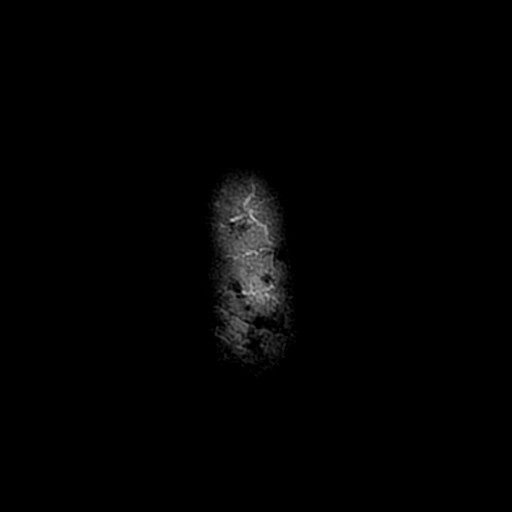

[Series 7: FLAIR · axial · 3.0mm · 0.43mm/px · z∈[-55,+99]mm · 3 of 27 slices shown]
[im 1/27]
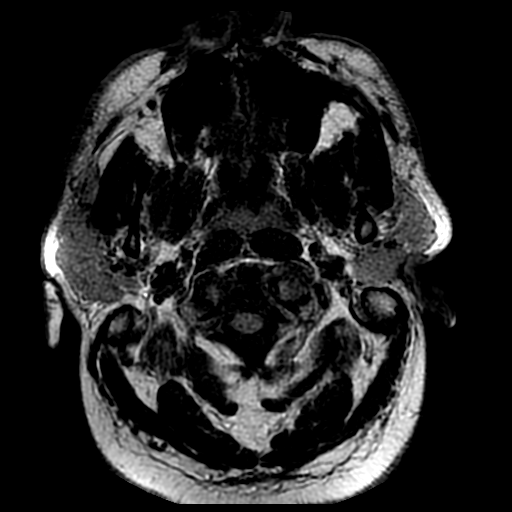
[im 14/27]
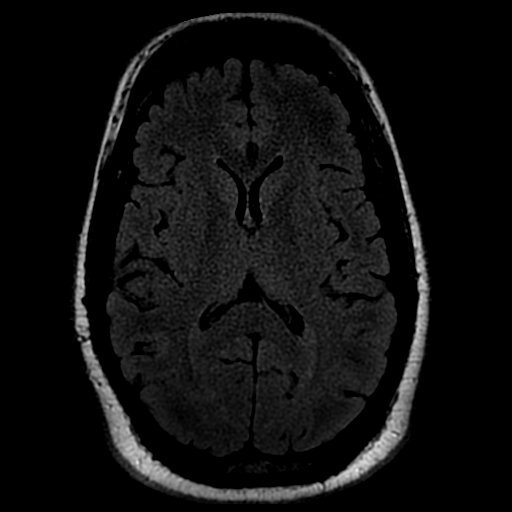
[im 27/27]
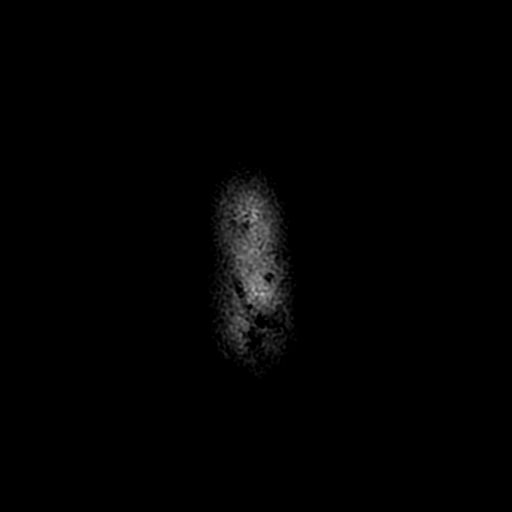

[Series 10: T2 · coronal · 5.0mm · 0.45mm/px · 3 of 28 slices shown (2 of 2)]
[im 1/28]
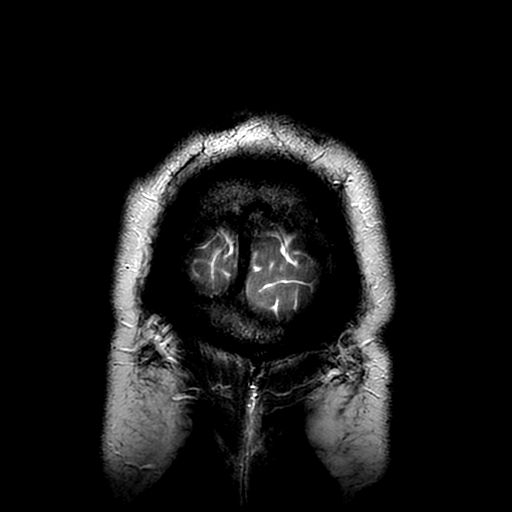
[im 14/28]
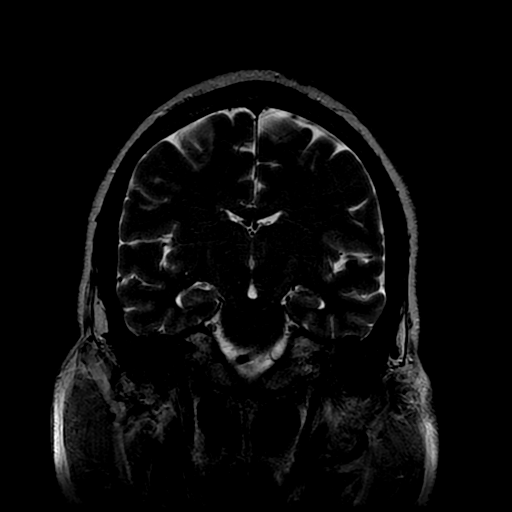
[im 28/28]
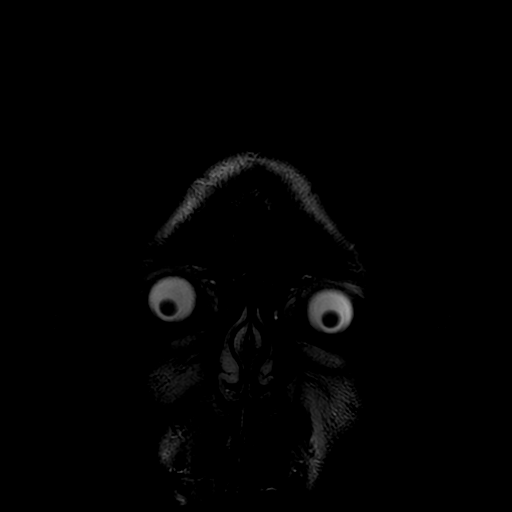

[Series 400: DWI · axial · 3.0mm · 1.09mm/px · z∈[-51,+103]mm · 6 of 53 slices shown (3 of 4)]
[im 1/53]
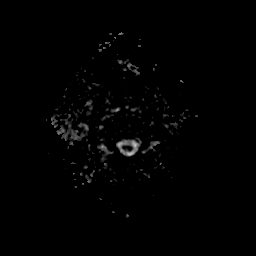
[im 11/53]
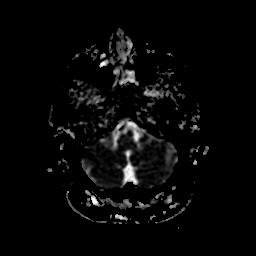
[im 21/53]
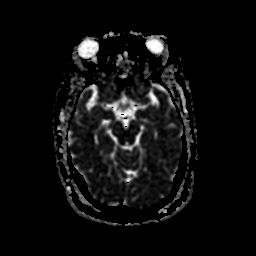
[im 32/53]
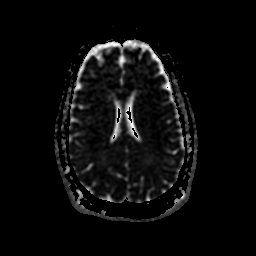
[im 42/53]
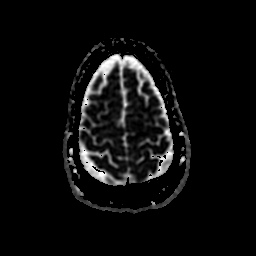
[im 53/53]
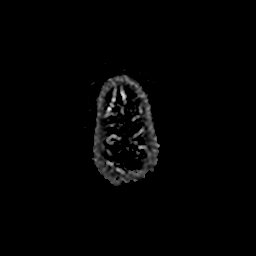

[Series 500: DWI · coronal · 5.0mm · 1.09mm/px · 4 of 35 slices shown (4 of 4)]
[im 1/35]
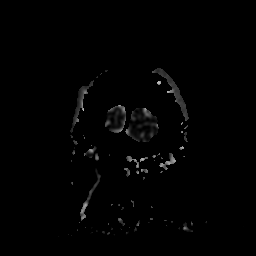
[im 12/35]
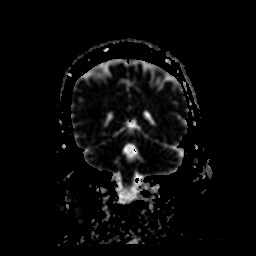
[im 23/35]
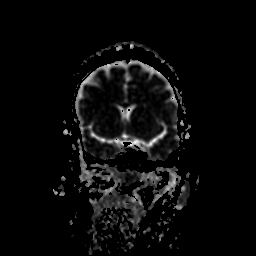
[im 35/35]
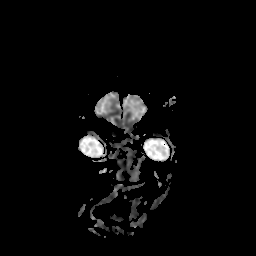

[39 of 48 positions shown; findings below may reference images not displayed]

FINDINGS: Brain: The brain has normal appearance without evidence of
malformation, atrophy, old or acute small or large vessel
infarction, hemorrhage, hydrocephalus or extra-axial collection. No
pituitary abnormality.

Vascular: Major vessels at the base of the brain show flow.

Skull and upper cervical spine: Normal

Sinuses/Orbits: Clear/ normal.

Other: None significant.
IMPRESSION: Normal examination.  No cause of headache identified.

## 2017-09-01 ENCOUNTER — Ambulatory Visit: Payer: Federal, State, Local not specified - PPO | Admitting: Emergency Medicine

## 2017-09-01 ENCOUNTER — Other Ambulatory Visit: Payer: Self-pay

## 2017-09-01 ENCOUNTER — Encounter: Payer: Self-pay | Admitting: Emergency Medicine

## 2017-09-01 VITALS — BP 148/90 | HR 95 | Temp 98.6°F | Resp 16 | Wt 256.8 lb

## 2017-09-01 DIAGNOSIS — Z5189 Encounter for other specified aftercare: Secondary | ICD-10-CM | POA: Diagnosis not present

## 2017-09-01 DIAGNOSIS — F418 Other specified anxiety disorders: Secondary | ICD-10-CM | POA: Diagnosis not present

## 2017-09-01 DIAGNOSIS — Z8659 Personal history of other mental and behavioral disorders: Secondary | ICD-10-CM | POA: Diagnosis not present

## 2017-09-01 DIAGNOSIS — Z87898 Personal history of other specified conditions: Secondary | ICD-10-CM | POA: Diagnosis not present

## 2017-09-01 MED ORDER — TRAZODONE HCL 100 MG PO TABS: 100.0000 mg | ORAL_TABLET | Freq: Every day | ORAL | 3 refills | Status: DC

## 2017-09-01 NOTE — Progress Notes (Signed)
Dylan RockersMarty Washington 57 y.o.   Chief Complaint  Patient presents with  . medication review    trazadone - when will he get off it or does he has to stay on it  . UPDATE LIGHT DUTY WORK NOTE    HISTORY OF PRESENT ILLNESS: This is a 57 y.o. male here for follow-up.  Recently started on trazodone for treatment of both depression and anxiety as well as insomnia.  Doing well.  Feels better.  Has been taking trazodone 75 mg at bedtime. Needs work note.  HPI   Prior to Admission medications   Medication Sig Start Date End Date Taking? Authorizing Provider  amLODipine (NORVASC) 10 MG tablet Take 1 tablet (10 mg total) by mouth daily. 12/22/16  Yes Doristine BosworthStallings, Zoe A, MD  aspirin EC 81 MG tablet Take 1 tablet (81 mg total) by mouth daily. 12/22/16  Yes Stallings, Zoe A, MD  cloNIDine (CATAPRES - DOSED IN MG/24 HR) 0.1 mg/24hr patch APPLY 1 PATCH(0.1 MG) EXTERNALLY TO THE SKIN 1 TIME A WEEK 07/07/17  Yes Laramie Gelles, Eilleen KempfMiguel Jose, MD  fluticasone [facility](FLONASE) 50 MCG/ACT nasal spray Place 2 sprays into both nostrils daily. 10/22/16  Yes Elvina SidleLauenstein, Kurt, MD  hydrALAZINE (APRESOLINE) 50 MG tablet TAKE 1 TABLET BY MOUTH THREE TIMES DAILY 08/08/17  Yes Endia Moncur, Eilleen KempfMiguel Jose, MD  ipratropium (ATROVENT) 0.06 % nasal spray Place 2 sprays into both nostrils 4 (four) times daily. 12/27/16  Yes Deatra Canterxford, William J, FNP  losartan-hydrochlorothiazide (HYZAAR) 100-25 MG tablet Take 1 tablet by mouth daily. 07/20/16  Yes Jamieon Lannen, Eilleen KempfMiguel Jose, MD  Multiple Vitamins-Minerals (ADEKS) chewable tablet Chew 1 tablet by mouth daily.   Yes [provider]  traZODone (DESYREL) 50 MG tablet Take 0.5-1 tablets (25-50 mg total) by mouth at bedtime as needed for sleep. 08/01/17  Yes Nesbit Michon, Eilleen KempfMiguel Jose, MD  gabapentin (NEURONTIN) 100 MG capsule Take 1 capsule (100 mg total) by mouth 3 (three) times daily. Patient not taking: Reported on 08/16/2017 07/29/17   Mardella LaymanHagler, Brian, MD  tadalafil (CIALIS) 5 MG tablet Take 1 tablet (5 mg total) by  mouth daily as needed for erectile dysfunction. Patient not taking: Reported on 09/01/2017 11/22/16   Doristine BosworthStallings, Zoe A, MD    No Known Allergies  Patient Active Problem List   Diagnosis Date Noted  . Chronic nasal congestion 08/16/2017  . History of depression 08/16/2017  . History of insomnia 08/16/2017  . Insomnia 08/01/2017  . Strain of lumbar region 04/14/2017  . Acute midline low back pain without sciatica 04/14/2017  . Chronic tension-type headache, not intractable 11/14/2016  . Bone spur of foot 02/08/2016  . RBBB (right bundle branch block) 04/02/2014  . Asthma 01/17/2012  . Environmental allergies 08/16/2011  . Hypertension   . Sleep apnea     Past Medical History:  Diagnosis Date  . Allergy    [facility]  . Asthma   . Hypertension   . Sleep apnea     Past Surgical History:  Procedure Laterality Date  . ACHILLES TENDON REPAIR    . NASAL SINUS SURGERY      Social History   Socioeconomic History  . Marital status: Single    Spouse name: Not on file  . Number of children: 0  . Years of education: college  . Highest education level: Not on file  Social Needs  . Financial resource strain: Not on file  . Food insecurity - worry: Not on file  . Food insecurity - inability: Not  on file  . Transportation needs - medical: Not on file  . Transportation needs - non-medical: Not on file  Occupational History    Employer: [employer]  Tobacco Use  . Smoking status: Former Smoker    Last attempt to quit: 06/30/2017    Years since quitting: 0.1  . Smokeless tobacco: Never Used  . Tobacco comment: 3 cig a day   Substance and Sexual Activity  . Alcohol use: No    Alcohol/week: 0.0 oz  . Drug use: No  . Sexual activity: Yes  Other Topics Concern  . Not on file  Social History Narrative   Marital status: married;       Children: none      Lives: with wife.      Employment: works at [employer]; Solicitor.      Tobacco: none       Alcohol: none      Drugs: none      Exercise: gym 4-5 days per week.   Denies  Caffeine use     Family History  Problem Relation Age of Onset  . Hypertension Mother   . Cancer Mother   . Hypertension Father      Review of Systems  Constitutional: Negative.  Negative for chills and fever.  HENT: Negative.  Negative for congestion, nosebleeds and sore throat.   Eyes: Negative for blurred vision and double vision.  Respiratory: Negative.  Negative for cough, shortness of breath and wheezing.   Cardiovascular: Negative for chest pain and palpitations.  Gastrointestinal: Negative.  Negative for abdominal pain, diarrhea, nausea and vomiting.  Genitourinary: Negative.   Musculoskeletal: Negative.  Negative for back pain, joint pain and myalgias.  Skin: Negative for rash.  Neurological: Negative for dizziness and headaches.  Endo/Heme/Allergies: Negative.   Psychiatric/Behavioral: Positive for depression. The patient is nervous/anxious.   All other systems reviewed and are negative.  Vitals:   09/01/17 1656  BP: (!) 148/90  Pulse: 95  Resp: 16  Temp: 98.6 F (37 C)  SpO2: 98%     Physical Exam  Constitutional: He is oriented to person, place, and time. He appears well-developed and well-nourished.  HENT:  Head: Normocephalic and atraumatic.  Eyes: EOM are normal. Pupils are equal, round, and reactive to light.  Neck: Normal range of motion. Neck supple.  Cardiovascular: Normal rate and regular rhythm.  Pulmonary/Chest: Effort normal.  Musculoskeletal: Normal range of motion.  Neurological: He is alert and oriented to person, place, and time.  Skin: Skin is warm and dry. Capillary refill takes less than 2 seconds.  Psychiatric: He has a normal mood and affect. His behavior is normal.  Looks better than last visit.  Mood better, in better spirits.  Vitals reviewed.  A total of 25 minutes was spent in the room with the patient, greater than 50% of which was in  counseling/coordination of care.  ASSESSMENT & PLAN: Earon was seen today for medication review and update light duty work note.  Diagnoses and all orders for this visit:  Depression with anxiety  History of depression -     traZODone (DESYREL) 100 MG tablet; Take 1 tablet (100 mg total) by mouth at bedtime.  History of insomnia -     traZODone (DESYREL) 100 MG tablet; Take 1 tablet (100 mg total) by mouth at bedtime.  Encounter for medication adjustment   Work note dictated and printed for patient.   Follow-up in 3 months.  Edwina Barth, MD [facility]

## 2017-09-01 NOTE — Patient Instructions (Signed)
     IF you received an x-ray today, you will receive an invoice from [facility]. Please contact [facility] at [phone] with questions or concerns regarding your invoice.   IF you received labwork today, you will receive an invoice from [facility]. Please contact [facility] at [phone] with questions or concerns regarding your invoice.   Our billing staff will not be able to assist you with questions regarding bills from these companies.  You will be contacted with the lab results as soon as they are available. The fastest way to get your results is to activate your My Chart account. Instructions are located on the last page of this paperwork. If you have not heard from us regarding the results in 2 weeks, please contact this office.     

## 2017-09-07 ENCOUNTER — Ambulatory Visit: Payer: Federal, State, Local not specified - PPO | Admitting: Family Medicine

## 2017-09-08 ENCOUNTER — Encounter: Payer: Self-pay | Admitting: Emergency Medicine

## 2017-09-08 ENCOUNTER — Ambulatory Visit: Payer: Federal, State, Local not specified - PPO | Admitting: Emergency Medicine

## 2017-09-08 ENCOUNTER — Ambulatory Visit (INDEPENDENT_AMBULATORY_CARE_PROVIDER_SITE_OTHER): Payer: Federal, State, Local not specified - PPO

## 2017-09-08 ENCOUNTER — Other Ambulatory Visit: Payer: Self-pay

## 2017-09-08 VITALS — BP 144/88 | HR 83 | Temp 98.3°F | Resp 16 | Wt 255.8 lb

## 2017-09-08 DIAGNOSIS — R002 Palpitations: Secondary | ICD-10-CM | POA: Diagnosis not present

## 2017-09-08 DIAGNOSIS — R079 Chest pain, unspecified: Secondary | ICD-10-CM

## 2017-09-08 DIAGNOSIS — I4510 Unspecified right bundle-branch block: Secondary | ICD-10-CM

## 2017-09-08 LAB — POCT CBC
Granulocyte percent: 60.3 %G (ref 37–80)
HCT, POC: 41.7 % — AB (ref 43.5–53.7)
Hemoglobin: 13.8 g/dL — AB (ref 14.1–18.1)
LYMPH, POC: 2.5 (ref 0.6–3.4)
MCH, POC: 33.4 pg — AB (ref 27–31.2)
MCHC: 33.1 g/dL (ref 31.8–35.4)
MCV: 100.7 fL — AB (ref 80–97)
MID (cbc): 0.4 (ref 0–0.9)
MPV: 8.4 fL (ref 0–99.8)
POC Granulocyte: 4.4 (ref 2–6.9)
POC LYMPH %: 33.6 % (ref 10–50)
POC MID %: 6.1 %M (ref 0–12)
Platelet Count, POC: 279 10*3/uL (ref 142–424)
RBC: 4.14 M/uL — AB (ref 4.69–6.13)
RDW, POC: 12.6 %
WBC: 7.3 10*3/uL (ref 4.6–10.2)

## 2017-09-08 NOTE — Progress Notes (Signed)
Dylan RockersMarty Crisci 57 y.o.   Chief Complaint  Patient presents with  . Palpitations    per patient face turning blue happened 09/05/17 and 09/06/17    HISTORY OF PRESENT ILLNESS: This is a 57 y.o. male complaining of chronic episodes when he suddenly feels his heart racing with left sided sharp chest pain, no diaphoresis and no difficulty breathing.  However he says his face turns blue or darker than usual.  Episodes used to be short lived but now are lasting 1-2 hours.  Has been to different doctors but no specific answers.  Denies any known cardiac condition.  Denies diabetes.  No other significant symptoms.  HPI   Prior to Admission medications   Medication Sig Start Date End Date Taking? Authorizing Provider  amLODipine (NORVASC) 10 MG tablet Take 1 tablet (10 mg total) by mouth daily. 12/22/16   Doristine BosworthStallings, Zoe A, MD  aspirin EC 81 MG tablet Take 1 tablet (81 mg total) by mouth daily. 12/22/16   Doristine BosworthStallings, Zoe A, MD  cloNIDine (CATAPRES - DOSED IN MG/24 HR) 0.1 mg/24hr patch APPLY 1 PATCH(0.1 MG) EXTERNALLY TO THE SKIN 1 TIME A WEEK 07/07/17   Juwana Thoreson, Eilleen KempfMiguel Jose, MD  fluticasone [facility](FLONASE) 50 MCG/ACT nasal spray Place 2 sprays into both nostrils daily. 10/22/16   Elvina SidleLauenstein, Kurt, MD  gabapentin (NEURONTIN) 100 MG capsule Take 1 capsule (100 mg total) by mouth 3 (three) times daily. Patient not taking: Reported on 08/16/2017 07/29/17   Mardella LaymanHagler, Brian, MD  hydrALAZINE (APRESOLINE) 50 MG tablet TAKE 1 TABLET BY MOUTH THREE TIMES DAILY 08/08/17   Georgina QuintSagardia, Kenai Fluegel Jose, MD  ipratropium (ATROVENT) 0.06 % nasal spray Place 2 sprays into both nostrils 4 (four) times daily. 12/27/16   Deatra Canterxford, William J, FNP  losartan-hydrochlorothiazide (HYZAAR) 100-25 MG tablet Take 1 tablet by mouth daily. 07/20/16   Georgina QuintSagardia, Corinthia Helmers Jose, MD  Multiple Vitamins-Minerals (ADEKS) chewable tablet Chew 1 tablet by mouth daily.    [provider]  tadalafil (CIALIS) 5 MG tablet Take 1 tablet (5 mg total) by mouth  daily as needed for erectile dysfunction. Patient not taking: Reported on 09/01/2017 11/22/16   Doristine BosworthStallings, Zoe A, MD  traZODone (DESYREL) 100 MG tablet Take 1 tablet (100 mg total) by mouth at bedtime. 09/01/17 10/01/17  Georgina QuintSagardia, Binyamin Nelis Jose, MD    No Known Allergies  Patient Active Problem List   Diagnosis Date Noted  . Chronic nasal congestion 08/16/2017  . History of depression 08/16/2017  . History of insomnia 08/16/2017  . Insomnia 08/01/2017  . Strain of lumbar region 04/14/2017  . Acute midline low back pain without sciatica 04/14/2017  . Chronic tension-type headache, not intractable 11/14/2016  . Bone spur of foot 02/08/2016  . RBBB (right bundle branch block) 04/02/2014  . Asthma 01/17/2012  . Environmental allergies 08/16/2011  . Hypertension   . Sleep apnea     Past Medical History:  Diagnosis Date  . Allergy    [facility]  . Asthma   . Hypertension   . Sleep apnea     Past Surgical History:  Procedure Laterality Date  . ACHILLES TENDON REPAIR    . NASAL SINUS SURGERY      Social History   Socioeconomic History  . Marital status: Single    Spouse name: Not on file  . Number of children: 0  . Years of education: college  . Highest education level: Not on file  Social Needs  . Financial resource strain: Not on file  .  Food insecurity - worry: Not on file  . Food insecurity - inability: Not on file  . Transportation needs - medical: Not on file  . Transportation needs - non-medical: Not on file  Occupational History    Employer: [employer]  Tobacco Use  . Smoking status: Former Smoker    Last attempt to quit: 06/30/2017    Years since quitting: 0.1  . Smokeless tobacco: Never Used  . Tobacco comment: 3 cig a day   Substance and Sexual Activity  . Alcohol use: No    Alcohol/week: 0.0 oz  . Drug use: No  . Sexual activity: Yes  Other Topics Concern  . Not on file  Social History Narrative   Marital status: married;        Children: none      Lives: with wife.      Employment: works at [employer]; Solicitor.      Tobacco: none      Alcohol: none      Drugs: none      Exercise: gym 4-5 days per week.   Denies  Caffeine use     Family History  Problem Relation Age of Onset  . Hypertension Mother   . Cancer Mother   . Hypertension Father      Review of Systems  Constitutional: Negative.  Negative for chills and fever.  HENT: Negative.  Negative for congestion, nosebleeds and sore throat.   Eyes: Negative.  Negative for blurred vision and double vision.  Respiratory: Negative.  Negative for cough, hemoptysis, shortness of breath and wheezing.   Cardiovascular: Positive for chest pain and palpitations. Negative for leg swelling and PND.  Gastrointestinal: Negative.  Negative for abdominal pain, diarrhea, nausea and vomiting.  Genitourinary: Negative.  Negative for dysuria and hematuria.  Musculoskeletal: Negative.  Negative for back pain, myalgias and neck pain.  Skin: Negative.  Negative for rash.  Neurological: Negative.  Negative for dizziness and headaches.  Endo/Heme/Allergies: Negative.   All other systems reviewed and are negative.  Vitals:   09/08/17 1007  BP: (!) 144/88  Pulse: 83  Resp: 16  Temp: 98.3 F (36.8 C)  SpO2: 98%     Physical Exam  Constitutional: He is oriented to person, place, and time. He appears well-developed and well-nourished.  HENT:  Head: Normocephalic and atraumatic.  Nose: Nose normal.  Mouth/Throat: Oropharynx is clear and moist.  Eyes: Conjunctivae and EOM are normal. Pupils are equal, round, and reactive to light.  Neck: Normal range of motion. Neck supple. No JVD present.  Cardiovascular: Normal rate, regular rhythm and normal heart sounds.  Pulmonary/Chest: Effort normal and breath sounds normal. No respiratory distress.  Abdominal: Soft. He exhibits no distension. There is no tenderness.  Musculoskeletal: Normal range of motion.    Lymphadenopathy:    He has no cervical adenopathy.  Neurological: He is alert and oriented to person, place, and time. No sensory deficit. He exhibits normal muscle tone.  Skin: Skin is warm and dry. Capillary refill takes less than 2 seconds. No rash noted.  Psychiatric: He has a normal mood and affect. His behavior is normal.  Vitals reviewed.  Chest x-ray reviewed by me.  NAD.  Radiologist report reviewed as well.  EKG: Normal sinus rhythm with right bundle branch block.  Ventricular rate 67.  No acute ischemic changes.  Compared with 9/23 2015 with no significant changes.  A total of 40 minutes was spent in the room with the patient, greater than 50%  of which was in counseling/coordination of care.  ASSESSMENT & PLAN: Cornel was seen today for palpitations.  Diagnoses and all orders for this visit:  Chest pain, unspecified type -     Comprehensive metabolic panel -     Hemoglobin A1c -     Lipid panel -     POCT CBC -     EKG 12-Lead -     DG Chest 2 View; Future -     EKG 12-Lead -     Ambulatory referral to Cardiology -     EKG 12-Lead  Palpitations -     Comprehensive metabolic panel -     Hemoglobin A1c -     Lipid panel -     POCT CBC -     EKG 12-Lead -     DG Chest 2 View; Future -     EKG 12-Lead -     Ambulatory referral to Cardiology -     EKG 12-Lead  RBBB    Patient Instructions       IF you received an x-ray today, you will receive an invoice from [facility]. Please contact [facility] at [phone] with questions or concerns regarding your invoice.   IF you received labwork today, you will receive an invoice from [facility]. Please contact [facility] at [phone] with questions or concerns regarding your invoice.   Our billing staff will not be able to assist you with questions regarding bills from these companies.  You will be contacted with the lab results as soon as they are available. The fastest way to get your  results is to activate your My Chart account. Instructions are located on the last page of this paperwork. If you have not heard from [place] regarding the results in 2 weeks, please contact this office.     Palpitations A palpitation is the feeling that your heart:  Has an uneven (irregular) heartbeat.  Is beating faster than normal.  Is fluttering.  Is skipping a beat.  This is usually not a serious problem. In some cases, you may need more medical tests. Follow these instructions at home:  Avoid: ? Caffeine in coffee, tea, soft drinks, diet pills, and energy drinks. ? Chocolate. ? Alcohol.  Do not use any tobacco products. These include cigarettes, chewing tobacco, and e-cigarettes. If you need help quitting, ask your doctor.  Try to reduce your stress. These things may help: ? Yoga. ? Meditation. ? Physical activity. Swimming, jogging, and walking are good choices. ? A method that helps you use your mind to control things in your body, like heartbeats (biofeedback).  Get plenty of rest and sleep.  Take over-the-counter and prescription medicines only as told by your doctor.  Keep all follow-up visits as told by your doctor. This is important. Contact a doctor if:  Your heartbeat is still fast or uneven after 24 hours.  Your palpitations occur more often. Get help right away if:  You have chest pain.  You feel short of breath.  You have a very bad headache.  You feel dizzy.  You pass out (faint). This information is not intended to replace advice given to you by your health care provider. Make sure you discuss any questions you have with your health care provider. Document Released: 04/05/2008 Document Revised: 12/03/2015 Document Reviewed: 03/12/2015 Elsevier Interactive Patient Education  2018 Elsevier Inc.      Edwina Barth, MD [facility]

## 2017-09-08 NOTE — Patient Instructions (Addendum)
     IF you received an x-ray today, you will receive an invoice from [facility]. Please contact [facility] at [phone] with questions or concerns regarding your invoice.   IF you received labwork today, you will receive an invoice from [facility]. Please contact [facility] at [phone] with questions or concerns regarding your invoice.   Our billing staff will not be able to assist you with questions regarding bills from these companies.  You will be contacted with the lab results as soon as they are available. The fastest way to get your results is to activate your My Chart account. Instructions are located on the last page of this paperwork. If you have not heard from us regarding the results in 2 weeks, please contact this office.     Palpitations A palpitation is the feeling that your heart:  Has an uneven (irregular) heartbeat.  Is beating faster than normal.  Is fluttering.  Is skipping a beat.  This is usually not a serious problem. In some cases, you may need more medical tests. Follow these instructions at home:  Avoid: ? Caffeine in coffee, tea, soft drinks, diet pills, and energy drinks. ? Chocolate. ? Alcohol.  Do not use any tobacco products. These include cigarettes, chewing tobacco, and e-cigarettes. If you need help quitting, ask your doctor.  Try to reduce your stress. These things may help: ? Yoga. ? Meditation. ? Physical activity. Swimming, jogging, and walking are good choices. ? A method that helps you use your mind to control things in your body, like heartbeats (biofeedback).  Get plenty of rest and sleep.  Take over-the-counter and prescription medicines only as told by your doctor.  Keep all follow-up visits as told by your doctor. This is important. Contact a doctor if:  Your heartbeat is still fast or uneven after 24 hours.  Your palpitations occur more often. Get help right away if:  You have chest pain.  You  feel short of breath.  You have a very bad headache.  You feel dizzy.  You pass out (faint). This information is not intended to replace advice given to you by your health care provider. Make sure you discuss any questions you have with your health care provider. Document Released: 04/05/2008 Document Revised: 12/03/2015 Document Reviewed: 03/12/2015 Elsevier Interactive Patient Education  2018 Elsevier Inc.  

## 2017-09-09 LAB — COMPREHENSIVE METABOLIC PANEL
ALBUMIN: 4.4 g/dL (ref 3.5–5.5)
ALT: 32 IU/L (ref 0–44)
AST: 24 IU/L (ref 0–40)
Albumin/Globulin Ratio: 1.6 (ref 1.2–2.2)
Alkaline Phosphatase: 57 IU/L (ref 39–117)
BUN / CREAT RATIO: 9 (ref 9–20)
BUN: 10 mg/dL (ref 6–24)
Bilirubin Total: 0.8 mg/dL (ref 0.0–1.2)
CO2: 22 mmol/L (ref 20–29)
CREATININE: 1.08 mg/dL (ref 0.76–1.27)
Calcium: 9.6 mg/dL (ref 8.7–10.2)
Chloride: 103 mmol/L (ref 96–106)
GFR calc non Af Amer: 77 mL/min/{1.73_m2} (ref >59)
GFR, EST AFRICAN AMERICAN: 89 mL/min/{1.73_m2} (ref >59)
GLUCOSE: 103 mg/dL — AB (ref 65–99)
Globulin, Total: 2.8 g/dL (ref 1.5–4.5)
Potassium: 3.8 mmol/L (ref 3.5–5.2)
Sodium: 142 mmol/L (ref 134–144)
TOTAL PROTEIN: 7.2 g/dL (ref 6.0–8.5)

## 2017-09-09 LAB — LIPID PANEL
CHOL/HDL RATIO: 3.7 ratio (ref 0.0–5.0)
Cholesterol, Total: 179 mg/dL (ref 100–199)
HDL: 48 mg/dL (ref >39)
LDL CALC: 120 mg/dL — AB (ref 0–99)
TRIGLYCERIDES: 57 mg/dL (ref 0–149)
VLDL Cholesterol Cal: 11 mg/dL (ref 5–40)

## 2017-09-09 LAB — HEMOGLOBIN A1C
Est. average glucose Bld gHb Est-mCnc: 88 mg/dL
Hgb A1c MFr Bld: 4.7 % — ABNORMAL LOW (ref 4.8–5.6)

## 2017-09-12 ENCOUNTER — Encounter: Payer: Self-pay | Admitting: Radiology

## 2017-09-19 ENCOUNTER — Ambulatory Visit: Payer: Federal, State, Local not specified - PPO | Admitting: Emergency Medicine

## 2017-09-19 ENCOUNTER — Encounter: Payer: Self-pay | Admitting: Emergency Medicine

## 2017-09-19 VITALS — BP 136/86 | HR 79 | Temp 98.5°F | Resp 17 | Ht 70.0 in | Wt 255.0 lb

## 2017-09-19 DIAGNOSIS — T887XXA Unspecified adverse effect of drug or medicament, initial encounter: Secondary | ICD-10-CM | POA: Insufficient documentation

## 2017-09-19 DIAGNOSIS — I10 Essential (primary) hypertension: Secondary | ICD-10-CM | POA: Diagnosis not present

## 2017-09-19 MED ORDER — METOPROLOL TARTRATE 50 MG PO TABS: 50.0000 mg | ORAL_TABLET | Freq: Every day | ORAL | 0 refills | Status: DC

## 2017-09-19 NOTE — Progress Notes (Signed)
Dylan RockersMarty Washington 57 y.o.   Chief Complaint  Patient presents with  . Medication Problem    HISTORY OF PRESENT ILLNESS: This is a 57 y.o. male complaining of amlodipine side effects.  Dylan Washington has a history of hypertension.  Has been on several medications for a while.  Last summer amlodipine was increased to 10 mg daily.  Presently taking Hyzaar.  Not taking clonidine patch or hydralazine.  He states that the following symptoms are secondary to amlodipine use: #1 increase heart rate #2 skin flushed and dark #3 lips look like an ashtray #4 lockjaw, hard to pronounce words #5 feels anxious.  States he stopped amlodipine and these symptoms went away.  Wants to change medication.  Blood pressure last evening off medication was 191/115.  Asymptomatic at present time.  HPI   Prior to Admission medications   Medication Sig Start Date End Date Taking? Authorizing Provider  amLODipine (NORVASC) 10 MG tablet Take 1 tablet (10 mg total) by mouth daily. 12/22/16  Yes Doristine BosworthStallings, Zoe A, MD  aspirin EC 81 MG tablet Take 1 tablet (81 mg total) by mouth daily. 12/22/16  Yes Stallings, Zoe A, MD  cloNIDine (CATAPRES - DOSED IN MG/24 HR) 0.1 mg/24hr patch APPLY 1 PATCH(0.1 MG) EXTERNALLY TO THE SKIN 1 TIME A WEEK 07/07/17  Yes Kynnadi Dicenso, Eilleen KempfMiguel Jose, MD  fluticasone [facility](FLONASE) 50 MCG/ACT nasal spray Place 2 sprays into both nostrils daily. 10/22/16  Yes Elvina SidleLauenstein, Kurt, MD  hydrALAZINE (APRESOLINE) 50 MG tablet TAKE 1 TABLET BY MOUTH THREE TIMES DAILY 08/08/17  Yes Mustapha Colson, Eilleen KempfMiguel Jose, MD  ipratropium (ATROVENT) 0.06 % nasal spray Place 2 sprays into both nostrils 4 (four) times daily. 12/27/16  Yes Deatra Canterxford, William J, FNP  losartan-hydrochlorothiazide (HYZAAR) 100-25 MG tablet Take 1 tablet by mouth daily. 07/20/16  Yes Erskin Zinda, Eilleen KempfMiguel Jose, MD  Multiple Vitamins-Minerals (ADEKS) chewable tablet Chew 1 tablet by mouth daily.   Yes [provider]  tadalafil (CIALIS) 5 MG tablet Take 1 tablet (5 mg total) by  mouth daily as needed for erectile dysfunction. 11/22/16  Yes Doristine BosworthStallings, Zoe A, MD  traZODone (DESYREL) 100 MG tablet Take 1 tablet (100 mg total) by mouth at bedtime. 09/01/17 10/01/17 Yes SagardiaEilleen Kempf, Marquisha Nikolov Jose, MD    No Known Allergies  Patient Active Problem List   Diagnosis Date Noted  . Chest pain 09/08/2017  . Palpitations 09/08/2017  . Chronic nasal congestion 08/16/2017  . History of depression 08/16/2017  . History of insomnia 08/16/2017  . Insomnia 08/01/2017  . Strain of lumbar region 04/14/2017  . Acute midline low back pain without sciatica 04/14/2017  . Chronic tension-type headache, not intractable 11/14/2016  . Bone spur of foot 02/08/2016  . RBBB 04/02/2014  . Asthma 01/17/2012  . Environmental allergies 08/16/2011  . Hypertension   . Sleep apnea     Past Medical History:  Diagnosis Date  . Allergy    [facility]  . Asthma   . Hypertension   . Sleep apnea     Past Surgical History:  Procedure Laterality Date  . ACHILLES TENDON REPAIR    . NASAL SINUS SURGERY      Social History   Socioeconomic History  . Marital status: Single    Spouse name: Not on file  . Number of children: 0  . Years of education: college  . Highest education level: Not on file  Social Needs  . Financial resource strain: Not on file  . Food insecurity - worry: Not  on file  . Food insecurity - inability: Not on file  . Transportation needs - medical: Not on file  . Transportation needs - non-medical: Not on file  Occupational History    Employer: [employer]  Tobacco Use  . Smoking status: Former Smoker    Last attempt to quit: 06/30/2017    Years since quitting: 0.2  . Smokeless tobacco: Never Used  . Tobacco comment: 3 cig a day   Substance and Sexual Activity  . Alcohol use: No    Alcohol/week: 0.0 oz  . Drug use: No  . Sexual activity: Yes  Other Topics Concern  . Not on file  Social History Narrative   Marital status: married;        Children: none      Lives: with wife.      Employment: works at [employer]; Solicitor.      Tobacco: none      Alcohol: none      Drugs: none      Exercise: gym 4-5 days per week.   Denies  Caffeine use     Family History  Problem Relation Age of Onset  . Hypertension Mother   . Cancer Mother   . Hypertension Father      Review of Systems  Constitutional: Negative.  Negative for chills, fever and weight loss.  HENT: Negative.  Negative for congestion, hearing loss, nosebleeds and sore throat.   Eyes: Negative.  Negative for blurred vision and double vision.  Respiratory: Negative.  Negative for cough and shortness of breath.   Cardiovascular: Negative.  Negative for chest pain, palpitations and leg swelling.  Gastrointestinal: Negative.  Negative for abdominal pain, blood in stool, diarrhea, nausea and vomiting.  Genitourinary: Negative.  Negative for dysuria and hematuria.  Musculoskeletal: Negative.  Negative for myalgias and neck pain.  Skin: Negative.  Negative for rash.  Neurological: Negative.   Endo/Heme/Allergies: Negative.   All other systems reviewed and are negative.   Vitals:   09/19/17 0956  BP: 136/86  Pulse: 79  Resp: 17  Temp: 98.5 F (36.9 C)  SpO2: 98%    Physical Exam  Constitutional: He is oriented to person, place, and time. He appears well-developed and well-nourished.  HENT:  Head: Normocephalic and atraumatic.  Eyes: EOM are normal. Pupils are equal, round, and reactive to light.  Neck: Normal range of motion. Neck supple.  Cardiovascular: Normal rate and regular rhythm.  Pulmonary/Chest: Effort normal and breath sounds normal.  Abdominal: Soft. There is no tenderness.  Musculoskeletal: Normal range of motion.  Neurological: He is alert and oriented to person, place, and time. No sensory deficit. He exhibits normal muscle tone.  Skin: Skin is warm and dry. Capillary refill takes less than 2 seconds.  Psychiatric: He has a normal mood and  affect. His behavior is normal.  Vitals reviewed.   Hypertension Stable today.  Patient is convinced amlodipine is causing many side effects.  Wants medication changed.  States he feels better off the amlodipine.  We will stop amlodipine and start Lopressor 50 mg daily.  Advised to monitor his blood pressure and keep a log.  Follow-up in 3-4 weeks.   ASSESSMENT & PLAN: Pavan was seen today for medication problem.  Diagnoses and all orders for this visit:  Essential hypertension  Medication side effects  Other orders -     metoprolol tartrate (LOPRESSOR) 50 MG tablet; Take 1 tablet (50 mg total) by mouth daily.   A total of  30 minutes was spent in the room with the patient, greater than 50% of which was in counseling/coordination of care .  Patient Instructions     Stop amlodipine.  Start Lopressor 50 mg daily.  IF you received an x-ray today, you will receive an invoice from [facility]. Please contact [facility] at [phone] with questions or concerns regarding your invoice.   IF you received labwork today, you will receive an invoice from [facility]. Please contact [facility] at [phone] with questions or concerns regarding your invoice.   Our billing staff will not be able to assist you with questions regarding bills from these companies.  You will be contacted with the lab results as soon as they are available. The fastest way to get your results is to activate your My Chart account. Instructions are located on the last page of this paperwork. If you have not heard from [place] regarding the results in 2 weeks, please contact this office.      Hypertension Hypertension, commonly called high blood pressure, is when the force of blood pumping through the arteries is too strong. The arteries are the blood vessels that carry blood from the heart throughout the body. Hypertension forces the heart to work harder to pump blood and may cause arteries to become  narrow or stiff. Having untreated or uncontrolled hypertension can cause heart attacks, strokes, kidney disease, and other problems. A blood pressure reading consists of a higher number over a lower number. Ideally, your blood pressure should be below 120/80. The first ("top") number is called the systolic pressure. It is a measure of the pressure in your arteries as your heart beats. The second ("bottom") number is called the diastolic pressure. It is a measure of the pressure in your arteries as the heart relaxes. What are the causes? The cause of this condition is not known. What increases the risk? Some risk factors for high blood pressure are under your control. Others are not. Factors you can change  Smoking.  Having type 2 diabetes mellitus, high cholesterol, or both.  Not getting enough exercise or physical activity.  Being overweight.  Having too much fat, sugar, calories, or salt (sodium) in your diet.  Drinking too much alcohol. Factors that are difficult or impossible to change  Having chronic kidney disease.  Having a family history of high blood pressure.  Age. Risk increases with age.  Race. You may be at higher risk if you are African-American.  Gender. Men are at higher risk than women before age 14. After age 50, women are at higher risk than men.  Having obstructive sleep apnea.  Stress. What are the signs or symptoms? Extremely high blood pressure (hypertensive crisis) may cause:  Headache.  Anxiety.  Shortness of breath.  Nosebleed.  Nausea and vomiting.  Severe chest pain.  Jerky movements you cannot control (seizures).  How is this diagnosed? This condition is diagnosed by measuring your blood pressure while you are seated, with your arm resting on a surface. The cuff of the blood pressure monitor will be placed directly against the skin of your upper arm at the level of your heart. It should be measured at least twice using the same arm.  Certain conditions can cause a difference in blood pressure between your right and left arms. Certain factors can cause blood pressure readings to be lower or higher than normal (elevated) for a short period of time:  When your blood pressure is higher when you are in a health  care provider's office than when you are at home, this is called white coat hypertension. Most people with this condition do not need medicines.  When your blood pressure is higher at home than when you are in a health care provider's office, this is called masked hypertension. Most people with this condition may need medicines to control blood pressure.  If you have a high blood pressure reading during one visit or you have normal blood pressure with other risk factors:  You may be asked to return on a different day to have your blood pressure checked again.  You may be asked to monitor your blood pressure at home for 1 week or longer.  If you are diagnosed with hypertension, you may have other blood or imaging tests to help your health care provider understand your overall risk for other conditions. How is this treated? This condition is treated by making healthy lifestyle changes, such as eating healthy foods, exercising more, and reducing your alcohol intake. Your health care provider may prescribe medicine if lifestyle changes are not enough to get your blood pressure under control, and if:  Your systolic blood pressure is above 130.  Your diastolic blood pressure is above 80.  Your personal target blood pressure may vary depending on your medical conditions, your age, and other factors. Follow these instructions at home: Eating and drinking  Eat a diet that is high in fiber and potassium, and low in sodium, added sugar, and fat. An example eating plan is called the DASH (Dietary Approaches to Stop Hypertension) diet. To eat this way: ? Eat plenty of fresh fruits and vegetables. Try to fill half of your plate at  each meal with fruits and vegetables. ? Eat whole grains, such as whole wheat pasta, brown rice, or whole grain bread. Fill about one quarter of your plate with whole grains. ? Eat or drink low-fat dairy products, such as skim milk or low-fat yogurt. ? Avoid fatty cuts of meat, processed or cured meats, and poultry with skin. Fill about one quarter of your plate with lean proteins, such as fish, chicken without skin, beans, eggs, and tofu. ? Avoid premade and processed foods. These tend to be higher in sodium, added sugar, and fat.  Reduce your daily sodium intake. Most people with hypertension should eat less than 1,500 mg of sodium a day.  Limit alcohol intake to no more than 1 drink a day for nonpregnant women and 2 drinks a day for men. One drink equals 12 oz of beer, 5 oz of wine, or 1 oz of hard liquor. Lifestyle  Work with your health care provider to maintain a healthy body weight or to lose weight. Ask what an ideal weight is for you.  Get at least 30 minutes of exercise that causes your heart to beat faster (aerobic exercise) most days of the week. Activities may include walking, swimming, or biking.  Include exercise to strengthen your muscles (resistance exercise), such as pilates or lifting weights, as part of your weekly exercise routine. Try to do these types of exercises for 30 minutes at least 3 days a week.  Do not use any products that contain nicotine or tobacco, such as cigarettes and e-cigarettes. If you need help quitting, ask your health care provider.  Monitor your blood pressure at home as told by your health care provider.  Keep all follow-up visits as told by your health care provider. This is important. Medicines  Take over-the-counter and prescription medicines only as  told by your health care provider. Follow directions carefully. Blood pressure medicines must be taken as prescribed.  Do not skip doses of blood pressure medicine. Doing this puts you at risk  for problems and can make the medicine less effective.  Ask your health care provider about side effects or reactions to medicines that you should watch for. Contact a health care provider if:  You think you are having a reaction to a medicine you are taking.  You have headaches that keep coming back (recurring).  You feel dizzy.  You have swelling in your ankles.  You have trouble with your vision. Get help right away if:  You develop a severe headache or confusion.  You have unusual weakness or numbness.  You feel faint.  You have severe pain in your chest or abdomen.  You vomit repeatedly.  You have trouble breathing. Summary  Hypertension is when the force of blood pumping through your arteries is too strong. If this condition is not controlled, it may put you at risk for serious complications.  Your personal target blood pressure may vary depending on your medical conditions, your age, and other factors. For most people, a normal blood pressure is less than 120/80.  Hypertension is treated with lifestyle changes, medicines, or a combination of both. Lifestyle changes include weight loss, eating a healthy, low-sodium diet, exercising more, and limiting alcohol. This information is not intended to replace advice given to you by your health care provider. Make sure you discuss any questions you have with your health care provider. Document Released: 06/27/2005 Document Revised: 05/25/2016 Document Reviewed: 05/25/2016 Elsevier Interactive Patient Education  2018 [employer]vier Inc.      Edwina Barth, MD [facility]

## 2017-09-19 NOTE — Assessment & Plan Note (Signed)
Stable today.  Patient is convinced amlodipine is causing many side effects.  Wants medication changed.  States he feels better off the amlodipine.  We will stop amlodipine and start Lopressor 50 mg daily.  Advised to monitor his blood pressure and keep a log.  Follow-up in 3-4 weeks.

## 2017-09-19 NOTE — Patient Instructions (Addendum)
Stop amlodipine.  Start Lopressor 50 mg daily.  IF you received an x-ray today, you will receive an invoice from [facility]. Please contact [facility] at [phone] with questions or concerns regarding your invoice.   IF you received labwork today, you will receive an invoice from [facility] [place]. Please contact [facility] at [phone] with questions or concerns regarding your invoice.   Our billing staff will not be able to assist you with questions regarding bills from these companies.  You will be contacted with the lab results as soon as they are available. The fastest way to get your results is to activate your My Chart account. Instructions are located on the last page of this paperwork. If you have not heard from [place] regarding the results in 2 weeks, please contact this office.      Hypertension Hypertension, commonly called high blood pressure, is when the force of blood pumping through the arteries is too strong. The arteries are the blood vessels that carry blood from the heart throughout the body. Hypertension forces the heart to work harder to pump blood and may cause arteries to become narrow or stiff. Having untreated or uncontrolled hypertension can cause heart attacks, strokes, kidney disease, and other problems. A blood pressure reading consists of a higher number over a lower number. Ideally, your blood pressure should be below 120/80. The first ("top") number is called the systolic pressure. It is a measure of the pressure in your arteries as your heart beats. The second ("bottom") number is called the diastolic pressure. It is a measure of the pressure in your arteries as the heart relaxes. What are the causes? The cause of this condition is not known. What increases the risk? Some risk factors for high blood pressure are under your control. Others are not. Factors you can change  Smoking.  Having type 2 diabetes mellitus, high cholesterol, or  both.  Not getting enough exercise or physical activity.  Being overweight.  Having too much fat, sugar, calories, or salt (sodium) in your diet.  Drinking too much alcohol. Factors that are difficult or impossible to change  Having chronic kidney disease.  Having a family history of high blood pressure.  Age. Risk increases with age.  Race. You may be at higher risk if you are African-American.  Gender. Men are at higher risk than women before age 108. After age 68, women are at higher risk than men.  Having obstructive sleep apnea.  Stress. What are the signs or symptoms? Extremely high blood pressure (hypertensive crisis) may cause:  Headache.  Anxiety.  Shortness of breath.  Nosebleed.  Nausea and vomiting.  Severe chest pain.  Jerky movements you cannot control (seizures).  How is this diagnosed? This condition is diagnosed by measuring your blood pressure while you are seated, with your arm resting on a surface. The cuff of the blood pressure monitor will be placed directly against the skin of your upper arm at the level of your heart. It should be measured at least twice using the same arm. Certain conditions can cause a difference in blood pressure between your right and left arms. Certain factors can cause blood pressure readings to be lower or higher than normal (elevated) for a short period of time:  When your blood pressure is higher when you are in a health care provider's office than when you are at home, this is called white coat hypertension. Most people with this condition do not need medicines.  When your blood  pressure is higher at home than when you are in a health care provider's office, this is called masked hypertension. Most people with this condition may need medicines to control blood pressure.  If you have a high blood pressure reading during one visit or you have normal blood pressure with other risk factors:  You may be asked to return on  a different day to have your blood pressure checked again.  You may be asked to monitor your blood pressure at home for 1 week or longer.  If you are diagnosed with hypertension, you may have other blood or imaging tests to help your health care provider understand your overall risk for other conditions. How is this treated? This condition is treated by making healthy lifestyle changes, such as eating healthy foods, exercising more, and reducing your alcohol intake. Your health care provider may prescribe medicine if lifestyle changes are not enough to get your blood pressure under control, and if:  Your systolic blood pressure is above 130.  Your diastolic blood pressure is above 80.  Your personal target blood pressure may vary depending on your medical conditions, your age, and other factors. Follow these instructions at home: Eating and drinking  Eat a diet that is high in fiber and potassium, and low in sodium, added sugar, and fat. An example eating plan is called the DASH (Dietary Approaches to Stop Hypertension) diet. To eat this way: ? Eat plenty of fresh fruits and vegetables. Try to fill half of your plate at each meal with fruits and vegetables. ? Eat whole grains, such as whole wheat pasta, brown rice, or whole grain bread. Fill about one quarter of your plate with whole grains. ? Eat or drink low-fat dairy products, such as skim milk or low-fat yogurt. ? Avoid fatty cuts of meat, processed or cured meats, and poultry with skin. Fill about one quarter of your plate with lean proteins, such as fish, chicken without skin, beans, eggs, and tofu. ? Avoid premade and processed foods. These tend to be higher in sodium, added sugar, and fat.  Reduce your daily sodium intake. Most people with hypertension should eat less than 1,500 mg of sodium a day.  Limit alcohol intake to no more than 1 drink a day for nonpregnant women and 2 drinks a day for men. One drink equals 12 oz of beer, 5  oz of wine, or 1 oz of hard liquor. Lifestyle  Work with your health care provider to maintain a healthy body weight or to lose weight. Ask what an ideal weight is for you.  Get at least 30 minutes of exercise that causes your heart to beat faster (aerobic exercise) most days of the week. Activities may include walking, swimming, or biking.  Include exercise to strengthen your muscles (resistance exercise), such as pilates or lifting weights, as part of your weekly exercise routine. Try to do these types of exercises for 30 minutes at least 3 days a week.  Do not use any products that contain nicotine or tobacco, such as cigarettes and e-cigarettes. If you need help quitting, ask your health care provider.  Monitor your blood pressure at home as told by your health care provider.  Keep all follow-up visits as told by your health care provider. This is important. Medicines  Take over-the-counter and prescription medicines only as told by your health care provider. Follow directions carefully. Blood pressure medicines must be taken as prescribed.  Do not skip doses of blood pressure medicine. Doing  this puts you at risk for problems and can make the medicine less effective.  Ask your health care provider about side effects or reactions to medicines that you should watch for. Contact a health care provider if:  You think you are having a reaction to a medicine you are taking.  You have headaches that keep coming back (recurring).  You feel dizzy.  You have swelling in your ankles.  You have trouble with your vision. Get help right away if:  You develop a severe headache or confusion.  You have unusual weakness or numbness.  You feel faint.  You have severe pain in your chest or abdomen.  You vomit repeatedly.  You have trouble breathing. Summary  Hypertension is when the force of blood pumping through your arteries is too strong. If this condition is not controlled, it  may put you at risk for serious complications.  Your personal target blood pressure may vary depending on your medical conditions, your age, and other factors. For most people, a normal blood pressure is less than 120/80.  Hypertension is treated with lifestyle changes, medicines, or a combination of both. Lifestyle changes include weight loss, eating a healthy, low-sodium diet, exercising more, and limiting alcohol. This information is not intended to replace advice given to you by your health care provider. Make sure you discuss any questions you have with your health care provider. Document Released: 06/27/2005 Document Revised: 05/25/2016 Document Reviewed: 05/25/2016 Elsevier Interactive Patient Education  [employer].

## 2017-09-27 ENCOUNTER — Ambulatory Visit: Payer: Federal, State, Local not specified - PPO | Admitting: Cardiology

## 2017-09-27 ENCOUNTER — Encounter: Payer: Self-pay | Admitting: Cardiology

## 2017-09-27 VITALS — BP 160/104 | HR 60 | Ht 71.0 in | Wt 257.8 lb

## 2017-09-27 DIAGNOSIS — G4733 Obstructive sleep apnea (adult) (pediatric): Secondary | ICD-10-CM

## 2017-09-27 DIAGNOSIS — I4510 Unspecified right bundle-branch block: Secondary | ICD-10-CM

## 2017-09-27 DIAGNOSIS — I10 Essential (primary) hypertension: Secondary | ICD-10-CM

## 2017-09-27 DIAGNOSIS — F5101 Primary insomnia: Secondary | ICD-10-CM | POA: Diagnosis not present

## 2017-09-27 MED ORDER — METOPROLOL SUCCINATE ER 25 MG PO TB24: 75.0000 mg | ORAL_TABLET | Freq: Every day | ORAL | 6 refills | Status: DC

## 2017-09-27 MED ORDER — LISINOPRIL 5 MG PO TABS: 5.0000 mg | ORAL_TABLET | Freq: Every day | ORAL | 3 refills | Status: DC

## 2017-09-27 NOTE — Addendum Note (Signed)
Addended by: Crist FatLOCKHART, Demaya Hardge P on: 09/27/2017 09:50 AM   Modules accepted: Orders

## 2017-09-27 NOTE — Progress Notes (Signed)
Cardiology Consultation:    Date:  09/27/2017   ID:  Dylan Washington, DOB 10/16/1960, MRN [id number]  PCP:  Doristine BosworthStallings, Zoe A, MD  Cardiologist:  Gypsy Balsamobert Krasowski, MD   Referring MD: Georgina QuintSagardia, Miguel Jose, *   Chief Complaint  Patient presents with  . Abnormal ECG  . Chest Pain  I am having difficulty with my blood pressure  History of Present Illness:    Dylan RockersMarty Majid is a [age] y.o. male who is being seen today for the evaluation of essential hypertension at the request of Georgina QuintSagardia, Miguel Jose, *.  He was diagnosed with high blood pressure about 8-10 years ago.  He has been on medication since that time.  Different type of medication has been tried but he did have difficulty tolerating many of those medications.  He was referred to us because of right bundle branch block as well as difficult to control high blood pressure.  Overall he has a lot of complaints.  Complaint of being weak tired exhausted complaint of having some pain in the back as well as in his legs.  Denies have any chest pain tightness squeezing pressure burning chest.  Sometimes he is aware of his heart beating.  He used to go on the regular basis to the gym however now is in the process of relocating to [place] and have no time to exercise.  He does have sleep apnea has been using CPAP mask for at least 10 years.  Does have chronic insomnia.  Would make the situation more complicated is the fact that he works during the night.  Past Medical History:  Diagnosis Date  . Allergy    allergy shots at [facility]  . Asthma   . Hypertension   . Sleep apnea     Past Surgical History:  Procedure Laterality Date  . ACHILLES TENDON REPAIR    . NASAL SINUS SURGERY      Current Medications: Current Meds  Medication Sig  . amLODipine (NORVASC) 10 MG tablet Take 1 tablet (10 mg total) by mouth daily.  Marland Kitchen. aspirin EC 81 MG tablet Take 1 tablet (81 mg total) by mouth daily.  . fluticasone (FLONASE) 50 MCG/ACT nasal  spray Place 2 sprays into both nostrils daily.  . hydrALAZINE (APRESOLINE) 50 MG tablet TAKE 1 TABLET BY MOUTH THREE TIMES DAILY  . losartan-hydrochlorothiazide (HYZAAR) 100-25 MG tablet Take 1 tablet by mouth daily.  . metoprolol tartrate (LOPRESSOR) 50 MG tablet Take 1 tablet (50 mg total) by mouth daily.  . Multiple Vitamins-Minerals (ADEKS) chewable tablet Chew 1 tablet by mouth daily.  . tadalafil (CIALIS) 5 MG tablet Take 1 tablet (5 mg total) by mouth daily as needed for erectile dysfunction.  . traZODone (DESYREL) 100 MG tablet Take 1 tablet (100 mg total) by mouth at bedtime.     Allergies:   Patient has no known allergies.   Social History   Socioeconomic History  . Marital status: Single    Spouse name: None  . Number of children: 0  . Years of education: college  . Highest education level: None  Social Needs  . Financial resource strain: None  . Food insecurity - worry: None  . Food insecurity - inability: None  . Transportation needs - medical: None  . Transportation needs - non-medical: None  Occupational History    Employer: [employer]  Tobacco Use  . Smoking status: Former Smoker    Last attempt to quit: 06/30/2017    Years since  quitting: 0.2  . Smokeless tobacco: Never Used  . Tobacco comment: 3 cig a day   Substance and Sexual Activity  . Alcohol use: No    Alcohol/week: 0.0 oz  . Drug use: No  . Sexual activity: Yes  Other Topics Concern  . None  Social History Narrative   Marital status: married;       Children: none      Lives: with wife.      Employment: works at [employer]; Solicitor.      Tobacco: none      Alcohol: none      Drugs: none      Exercise: gym 4-5 days per week.   Denies  Caffeine use      Family History: The patient's family history includes Cancer in his mother; Hypertension in his father and mother. ROS:   Please see the history of present illness.    All 14 point review of systems negative except as described per  history of present illness.  EKGs/Labs/Other Studies Reviewed:    The following studies were reviewed today:   EKG:  EKG is  ordered today.  The ekg ordered today demonstrates sinus bradycardia at rate of 58.  Normal PR interval.  Right bundle branch block.  Recent Labs: 09/08/2017: ALT 32; BUN 10; Creatinine, Ser 1.08; Hemoglobin 13.8; Potassium 3.8; Sodium 142  Recent Lipid Panel    Component Value Date/Time   CHOL 179 09/08/2017 1138   TRIG 57 09/08/2017 1138   HDL 48 09/08/2017 1138   CHOLHDL 3.7 09/08/2017 1138   LDLCALC 120 (H) 09/08/2017 1138    Physical Exam:    VS:  BP (!) 160/104   Pulse 60   Ht 5\' 11"  (1.803 m)   Wt 257 lb 12.8 oz (116.9 kg)   SpO2 94%   BMI 35.96 kg/m     Wt Readings from Last 3 Encounters:  09/27/17 257 lb 12.8 oz (116.9 kg)  09/19/17 255 lb (115.7 kg)  09/08/17 255 lb 12.8 oz (116 kg)     GEN:  Well nourished, well developed in no acute distress HEENT: Normal NECK: No JVD; No carotid bruits LYMPHATICS: No lymphadenopathy CARDIAC: RRR, no murmurs, no rubs, no gallops RESPIRATORY:  Clear to auscultation without rales, wheezing or rhonchi  ABDOMEN: Soft, non-tender, non-distended MUSCULOSKELETAL:  No edema; No deformity  SKIN: Warm and dry NEUROLOGIC:  Alert and oriented x 3 PSYCHIATRIC:  Normal affect   ASSESSMENT:    1. Essential hypertension   2. RBBB   3. Obstructive sleep apnea syndrome   4. Primary insomnia    PLAN:    In order of problems listed above:  1. Essential hypertension: Quite complicated issue.  He reports multiple side effects with multiple medications.  He is taking losartan right now and he is convinced that losartan prevent him from sleeping.  I told him that this is rather unlikely but he insisted on stopping that medication.  Therefore we will stop his losartan.  I will put him on lisinopril 5 mg every day.  In about 1 week we will recheck kidney function to make sure his creatinine is fine.  He is also  taking beta-blocker however short acting form once a day he is taking the Toprol tartrate 50 mg daily I will change this medication to metoprolol succinate we will restart 75 mg every single day.  He does have hydralazine at home and he said he takes it only once a day sometimes I  told him if his blood pressure will be more than 160/100 to start taking 25 mg 3 times a day.  Also told him that in the long-term taking this medication 3 times a day probably is not a good solution it will be only temporary solution until we reestablish appropriate management for his high blood pressure.  We did talk also about nonpharmacological way to manage high blood pressure meaning avoiding salty food, exercises on the regular basis and more critically management of his sleep apnea appropriately.  I advised him to contact his sleep apnea team to make sure his mask is adjusted properly. 2. Right bundle branch block: Will do echocardiogram to assess his left ventricular ejection fraction as well as will be looking for left ventricle hypertrophy to assess need for aggressiveness in management of his high blood pressure.  I will also look at the right side of his heart to make sure there is no pulmonary hypertension which could be related to sleep apnea. 3. Primary insomnia she takes trazodone with pretty good results.  However again he thinks losartan create a problem.  He insisted on stopping this medication which I will do.   Medication Adjustments/Labs and Tests Ordered: Current medicines are reviewed at length with the patient today.  Concerns regarding medicines are outlined above.  No orders of the defined types were placed in this encounter.  No orders of the defined types were placed in this encounter.   Signed, Georgeanna Lea, MD, [facility]. 09/27/2017 9:37 AM    [facility]

## 2017-09-27 NOTE — Patient Instructions (Signed)
Medication Instructions:  Your physician has recommended you make the following change in your medication:  STOP losartan STOP metoprolol tartrate  START lisinopril 5 mg daily START metoprolol succinate 75 mg daily  Labwork: Your physician recommends that you return for lab work in 1 week: BMP  Testing/Procedures: You had an EKG today.   Your physician has requested that you have an echocardiogram. Echocardiography is a painless test that uses sound waves to create images of your heart. It provides your doctor with information about the size and shape of your heart and how well your heart's chambers and valves are working. This procedure takes approximately one hour. There are no restrictions for this procedure.    Follow-Up: Your physician recommends that you schedule a follow-up appointment in: 1 month  Any Other Special Instructions Will Be Listed Below (If Applicable).     If you need a refill on your cardiac medications before your next appointment, please call your pharmacy.

## 2017-10-05 ENCOUNTER — Institutional Professional Consult (permissible substitution): Payer: Federal, State, Local not specified - PPO | Admitting: Pulmonary Disease

## 2017-10-06 ENCOUNTER — Telehealth: Payer: Self-pay | Admitting: Cardiology

## 2017-10-06 NOTE — Telephone Encounter (Signed)
Advised patient to stop taking the lisinopril 5 mg daily for the next couple of days and see how he feels. Advised patient to keep taking the metoprolol succinate 75 mg daily. Patient verbalized understanding. He was supposed to have follow up lab work done on Wednesday however that is when he stopped the new medications added at his last office visit so he did not get lab work drawn. Informed patient that I would call and check on him on Monday and we would go from there. No further questions.

## 2017-10-06 NOTE — Telephone Encounter (Signed)
Patient states that when he takes the new blood pressure  Medicine and it makes him really sick and throwup and he switched back to his old meds. Please call him.

## 2017-10-09 NOTE — Telephone Encounter (Signed)
Called to check on patient. Patient states he tried taking the metoprolol succinate 75 mg once more and he "threw up all day long." Since then, patient has went back to taking metoprolol tartrate 50 mg daily and hydralazine 50 mg three times daily. Patient states his blood pressure is still running high but he is not throwing up. Last reading was 175/110. Patient states he is very sensitive to medications. Will have Dr. Bing MatterKrasowski advise.

## 2017-10-09 NOTE — Telephone Encounter (Signed)
Dr. Bing MatterKrasowski advised for patient to try taking breaking up metoprolol succinate 75 mg dose daily into twice daily, so 1.5 tablets with breakfast and 1.5 tablets with dinner. Left message for patient with information per patient request because he works night shift and is sleeping during the day.

## 2017-10-13 DIAGNOSIS — I10 Essential (primary) hypertension: Secondary | ICD-10-CM | POA: Diagnosis not present

## 2017-10-13 DIAGNOSIS — J019 Acute sinusitis, unspecified: Secondary | ICD-10-CM | POA: Diagnosis not present

## 2017-10-24 ENCOUNTER — Ambulatory Visit (HOSPITAL_BASED_OUTPATIENT_CLINIC_OR_DEPARTMENT_OTHER)
Admission: RE | Admit: 2017-10-24 | Discharge: 2017-10-24 | Disposition: A | Discharge status: Home / Self Care | Payer: Federal, State, Local not specified - PPO | Source: Ambulatory Visit | Attending: Cardiology | Admitting: Cardiology

## 2017-10-24 ENCOUNTER — Institutional Professional Consult (permissible substitution): Payer: Federal, State, Local not specified - PPO | Admitting: Internal Medicine

## 2017-10-24 DIAGNOSIS — I10 Essential (primary) hypertension: Secondary | ICD-10-CM | POA: Insufficient documentation

## 2017-10-24 DIAGNOSIS — G4733 Obstructive sleep apnea (adult) (pediatric): Secondary | ICD-10-CM | POA: Insufficient documentation

## 2017-10-24 DIAGNOSIS — F5101 Primary insomnia: Secondary | ICD-10-CM

## 2017-10-24 DIAGNOSIS — I4510 Unspecified right bundle-branch block: Secondary | ICD-10-CM | POA: Insufficient documentation

## 2017-10-24 NOTE — Progress Notes (Signed)
Echocardiogram 2D Echocardiogram has been performed.  Dorothey BasemanReel, Tyreonna Czaplicki M 10/24/2017, 9:54 AM

## 2017-10-28 ENCOUNTER — Ambulatory Visit: Payer: Federal, State, Local not specified - PPO | Admitting: Family Medicine

## 2017-10-30 ENCOUNTER — Ambulatory Visit: Payer: Federal, State, Local not specified - PPO | Admitting: Cardiology

## 2017-10-31 ENCOUNTER — Encounter: Payer: Self-pay | Admitting: Cardiology

## 2017-10-31 ENCOUNTER — Encounter: Payer: Self-pay | Admitting: *Deleted

## 2017-10-31 ENCOUNTER — Ambulatory Visit: Payer: Federal, State, Local not specified - PPO | Admitting: Cardiology

## 2017-10-31 VITALS — BP 184/96 | HR 72 | Ht 71.0 in | Wt 269.8 lb

## 2017-10-31 DIAGNOSIS — T887XXA Unspecified adverse effect of drug or medicament, initial encounter: Secondary | ICD-10-CM

## 2017-10-31 DIAGNOSIS — G4733 Obstructive sleep apnea (adult) (pediatric): Secondary | ICD-10-CM

## 2017-10-31 DIAGNOSIS — I4510 Unspecified right bundle-branch block: Secondary | ICD-10-CM | POA: Diagnosis not present

## 2017-10-31 DIAGNOSIS — I10 Essential (primary) hypertension: Secondary | ICD-10-CM | POA: Diagnosis not present

## 2017-10-31 NOTE — Progress Notes (Signed)
Cardiology Office Note:    Date:  10/31/2017   ID:  Dylan Washington, DOB July 22, 1960, MRN [id number]  PCP:  Dylan Bosworth, MD  Cardiologist:  Dylan Balsam, MD    Referring MD: Dylan Bosworth, MD   Chief Complaint  Patient presents with  . 1 month follow up  Doing well feeling better  History of Present Illness:    Dylan Washington is a 57 y.o. male with essential hypertension that appears to be difficult to control he did have mishaps with multiple medications finally we gradually start putting him medication that he can tolerate.  On the combination of medication he is on right now he feels better.  He did have echocardiogram done which showed preserved left ventricular ejection fraction but evidence of moderate left ventricle hypertrophy therefore we have target organ damage because of hypertension.  The key will be to get his blood pressure under control.  I will check Chem-7 today if Chem-7 is acceptable we will double the dose of lisinopril from 5-10 in the future anticipate to be able to increase lisinopril hopefully to 20 or maybe even 40 mg.  Previously he were clonidine patch which seems to be helping however I favor lisinopril first before going to clonidine patch.  He was told not to lift heavy weights in the matter-of-fact will give him a letter to his work not permitting him to lift more than 20 pounds.  I see him back in my office in about 1 month or sooner if he get a problem  Past Medical History:  Diagnosis Date  . Allergy    allergy shots at [facility]  . Asthma   . Hypertension   . Sleep apnea     Past Surgical History:  Procedure Laterality Date  . ACHILLES TENDON REPAIR    . NASAL SINUS SURGERY      Current Medications: Current Meds  Medication Sig  . aspirin EC 81 MG tablet Take 1 tablet (81 mg total) by mouth daily.  . fluticasone (FLONASE) 50 MCG/ACT nasal spray Place 2 sprays into both nostrils daily.  . hydrALAZINE (APRESOLINE) 50 MG  tablet TAKE 1 TABLET BY MOUTH THREE TIMES DAILY  . lisinopril (PRINIVIL,ZESTRIL) 5 MG tablet Take 1 tablet (5 mg total) by mouth daily.  . metoprolol succinate (TOPROL-XL) 25 MG 24 hr tablet Take 3 tablets (75 mg total) by mouth daily. Take with or immediately following a meal.  . Multiple Vitamins-Minerals (ADEKS) chewable tablet Chew 1 tablet by mouth daily.  . tadalafil (CIALIS) 5 MG tablet Take 1 tablet (5 mg total) by mouth daily as needed for erectile dysfunction.  . traZODone (DESYREL) 100 MG tablet Take 1 tablet (100 mg total) by mouth at bedtime.     Allergies:   Patient has no known allergies.   Social History   Socioeconomic History  . Marital status: Single    Spouse name: Not on file  . Number of children: 0  . Years of education: college  . Highest education level: Not on file  Occupational History    Employer: [employer]  Social Needs  . Financial resource strain: Not on file  . Food insecurity:    Worry: Not on file    Inability: Not on file  . Transportation needs:    Medical: Not on file    Non-medical: Not on file  Tobacco Use  . Smoking status: Former Smoker    Last attempt to quit: 06/30/2017    Years  since quitting: 0.3  . Smokeless tobacco: Never Used  . Tobacco comment: 3 cig a day   Substance and Sexual Activity  . Alcohol use: No    Alcohol/week: 0.0 oz  . Drug use: No  . Sexual activity: Yes  Lifestyle  . Physical activity:    Days per week: Not on file    Minutes per session: Not on file  . Stress: Not on file  Relationships  . Social connections:    Talks on phone: Not on file    Gets together: Not on file    Attends religious service: Not on file    Active member of club or organization: Not on file    Attends meetings of clubs or organizations: Not on file    Relationship status: Not on file  Other Topics Concern  . Not on file  Social History Narrative   Marital status: married;       Children: none      Lives: with wife.       Employment: works at [employer]; Solicitor.      Tobacco: none      Alcohol: none      Drugs: none      Exercise: gym 4-5 days per week.   Denies  Caffeine use      Family History: The patient's family history includes Cancer in his mother; Hypertension in his father and mother. ROS:   Please see the history of present illness.    All 14 point review of systems negative except as described per history of present illness  EKGs/Labs/Other Studies Reviewed:      Recent Labs: 09/08/2017: ALT 32; BUN 10; Creatinine, Ser 1.08; Hemoglobin 13.8; Potassium 3.8; Sodium 142  Recent Lipid Panel    Component Value Date/Time   CHOL 179 09/08/2017 1138   TRIG 57 09/08/2017 1138   HDL 48 09/08/2017 1138   CHOLHDL 3.7 09/08/2017 1138   LDLCALC 120 (H) 09/08/2017 1138    Physical Exam:    VS:  BP (!) 184/96   Pulse 72   Ht 5\' 11"  (1.803 m)   Wt 269 lb 12.8 oz (122.4 kg)   SpO2 96%   BMI 37.63 kg/m     Wt Readings from Last 3 Encounters:  10/31/17 269 lb 12.8 oz (122.4 kg)  09/27/17 257 lb 12.8 oz (116.9 kg)  09/19/17 255 lb (115.7 kg)     GEN:  Well nourished, well developed in no acute distress HEENT: Normal NECK: No JVD; No carotid bruits LYMPHATICS: No lymphadenopathy CARDIAC: RRR, no murmurs, no rubs, no gallops RESPIRATORY:  Clear to auscultation without rales, wheezing or rhonchi  ABDOMEN: Soft, non-tender, non-distended MUSCULOSKELETAL:  No edema; No deformity  SKIN: Warm and dry LOWER EXTREMITIES: no swelling NEUROLOGIC:  Alert and oriented x 3 PSYCHIATRIC:  Normal affect   ASSESSMENT:    1. Essential hypertension   2. RBBB   3. Obstructive sleep apnea syndrome   4. Medication side effects    PLAN:    In order of problems listed above:  1. Essential hypertension: Plan as outlined above. 2. Right bundle branch block: I suspect may be related to obstructive sleep apnea.  Echocardiogram did not show any gross pathology in the right side of his  heart. 3. Obstructive sleep apnea: He is scheduled to have repeat his sleep study within next few days.  Will wait for the results of it. 4. Multiple medication side effects now the combination that he is taking  right now seems to be tolerating well.   Medication Adjustments/Labs and Tests Ordered: Current medicines are reviewed at length with the patient today.  Concerns regarding medicines are outlined above.  No orders of the defined types were placed in this encounter.  Medication changes: No orders of the defined types were placed in this encounter.   Signed, Georgeanna Leaobert J. Espiridion Supinski, MD, [facility] 10/31/2017 9:44 AM    [facility]

## 2017-10-31 NOTE — Patient Instructions (Signed)
Medication Instructions:  Your physician recommends that you continue on your current medications as directed. Please refer to the Current Medication list given to you today.   Labwork: Your physician recommends that you return for lab work today: BMP.   Testing/Procedures: None  Follow-Up: Your physician recommends that you schedule a follow-up appointment in: 1 month.   Any Other Special Instructions Will Be Listed Below (If Applicable).     If you need a refill on your cardiac medications before your next appointment, please call your pharmacy.

## 2017-11-01 ENCOUNTER — Ambulatory Visit: Payer: Federal, State, Local not specified - PPO | Admitting: Emergency Medicine

## 2017-11-01 ENCOUNTER — Telehealth: Payer: Self-pay | Admitting: *Deleted

## 2017-11-01 DIAGNOSIS — I10 Essential (primary) hypertension: Secondary | ICD-10-CM

## 2017-11-01 LAB — BASIC METABOLIC PANEL
BUN / CREAT RATIO: 13 (ref 9–20)
BUN: 11 mg/dL (ref 6–24)
CHLORIDE: 103 mmol/L (ref 96–106)
CO2: 23 mmol/L (ref 20–29)
Calcium: 9.1 mg/dL (ref 8.7–10.2)
Creatinine, Ser: 0.85 mg/dL (ref 0.76–1.27)
GFR calc Af Amer: 114 mL/min/{1.73_m2} (ref >59)
GFR calc non Af Amer: 99 mL/min/{1.73_m2} (ref >59)
GLUCOSE: 103 mg/dL — AB (ref 65–99)
Potassium: 4.3 mmol/L (ref 3.5–5.2)
SODIUM: 139 mmol/L (ref 134–144)

## 2017-11-01 MED ORDER — LISINOPRIL 10 MG PO TABS: 10.0000 mg | ORAL_TABLET | Freq: Every day | ORAL | 3 refills | Status: AC

## 2017-11-01 NOTE — Telephone Encounter (Signed)
-----   Message from Georgeanna Leaobert J Krasowski, MD sent at 11/01/2017 11:10 AM EDT ----- chem7 normal, double lisinoprol and repeat chem7 in 1 week

## 2017-11-01 NOTE — Telephone Encounter (Signed)
Patient informed of results. Advised to increase lisinopril from 5 mg to 10 mg daily and to come back to the office for repeat lab work in 1 week. Refill sent. Patient verbalized understanding. No further questions.

## 2017-11-04 ENCOUNTER — Ambulatory Visit: Payer: Federal, State, Local not specified - PPO | Admitting: Emergency Medicine

## 2017-11-07 ENCOUNTER — Ambulatory Visit: Payer: Federal, State, Local not specified - PPO | Admitting: Emergency Medicine

## 2017-11-10 ENCOUNTER — Institutional Professional Consult (permissible substitution): Payer: Federal, State, Local not specified - PPO | Admitting: Internal Medicine

## 2017-11-23 ENCOUNTER — Telehealth: Payer: Self-pay | Admitting: *Deleted

## 2017-11-23 NOTE — Telephone Encounter (Signed)
Left message for patient to return call. Patient's follow up appointment was scheduled with Dr. Dulce Sellar and needs to be rescheduled to see Dr. Bing Matter.

## 2017-11-23 NOTE — Telephone Encounter (Signed)
Left message to remind patient his follow up lab work is overdue. Advised him to come in to our office to have this done, no appointment needed.

## 2017-11-29 ENCOUNTER — Ambulatory Visit: Payer: Federal, State, Local not specified - PPO | Admitting: Cardiology

## 2017-12-01 ENCOUNTER — Telehealth: Payer: Self-pay

## 2017-12-01 NOTE — Telephone Encounter (Signed)
Left message on patients voicemail to return call regarding appt that was scheduled on 12-12-17 with Dr Dulce Sellar.  He is normally seen by Dr Bing Matter.  Need to verify provider.

## 2017-12-05 ENCOUNTER — Telehealth: Payer: Self-pay

## 2017-12-05 NOTE — Telephone Encounter (Signed)
Spoke with patient, and original appointment with Dr Dulce Sellar was booked in error.  Patient prefers to see Dr Bing Matter. Appointment was rescheduled with Dr Bing Matter.  Patient agrees to changes.

## 2017-12-05 NOTE — Telephone Encounter (Signed)
Spoke with patient, and original appointment with Dr Munley was booked in error.  Patient prefers to see Dr Krasowski. Appointment was rescheduled with Dr Krasowski.  Patient agrees to changes.   

## 2017-12-06 NOTE — Telephone Encounter (Signed)
Left message reminding patient to come back to the office for follow up lab work.

## 2017-12-12 ENCOUNTER — Encounter: Payer: Self-pay | Admitting: Cardiology

## 2017-12-12 ENCOUNTER — Ambulatory Visit: Payer: Federal, State, Local not specified - PPO | Admitting: Cardiology

## 2017-12-12 ENCOUNTER — Encounter: Payer: Self-pay | Admitting: *Deleted

## 2017-12-12 ENCOUNTER — Other Ambulatory Visit: Payer: Self-pay | Admitting: Cardiology

## 2017-12-12 VITALS — BP 140/78 | HR 68 | Ht 71.0 in | Wt 272.1 lb

## 2017-12-12 DIAGNOSIS — G4733 Obstructive sleep apnea (adult) (pediatric): Secondary | ICD-10-CM

## 2017-12-12 DIAGNOSIS — I10 Essential (primary) hypertension: Secondary | ICD-10-CM | POA: Diagnosis not present

## 2017-12-12 DIAGNOSIS — R002 Palpitations: Secondary | ICD-10-CM | POA: Diagnosis not present

## 2017-12-12 MED ORDER — METOPROLOL TARTRATE 25 MG PO TABS: 25.0000 mg | ORAL_TABLET | Freq: Two times a day (BID) | ORAL | 3 refills | Status: AC

## 2017-12-12 NOTE — Patient Instructions (Signed)
Medication Instructions:  Your physician has recommended you make the following change in your medication:  STOP metoprolol succinate START metoprolol tartrate 25 mg twice daily  Labwork: Your physician recommends that you return for lab work today: BMP.   Testing/Procedures: None  Follow-Up: Your physician wants you to follow-up in: 3 months. You will receive a reminder letter in the mail two months in advance. If you don't receive a letter, please call our office to schedule the follow-up appointment.   If you need a refill on your cardiac medications before your next appointment, please call your pharmacy.   Thank you for choosing [facility]! Mady Gemmaatherine Lockhart, RN [phone]

## 2017-12-12 NOTE — Progress Notes (Signed)
Cardiology Office Note:    Date:  12/12/2017   ID:  Dylan RockersMarty Washington, DOB July 19, 1960, MRN [id number]  PCP:  Doristine BosworthStallings, Dylan A, MD  Cardiologist:  Gypsy Balsamobert Evangelynn Lochridge, MD    Referring MD: Doristine BosworthStallings, Dylan A, MD   Chief Complaint  Patient presents with  . 1 month follow up  Doing well but does have some side effect of medications  History of Present Illness:    Dylan Washington is a 57 y.o. male with hypertension obstructive sleep apnea.  There is some difficulty controlling his blood pressure he does have some very strange side effects for example today he wanted me to stop his lisinopril because his lisinopril make his lip black.  On top of that he thinks that the metoprolol make him stomach upset and give him hemorrhoids.  I told him that this was very unusual side effects that he is describing.  He thinks when he will take metoprolol regular form rather than long-acting that will be better so I was able to convince him to continue with lisinopril the present dose and then switching from metoprolol succinate to metoprolol tartrate he takes only 50 mg a day therefore will go with 25 and Toprol tartrate in the morning and afternoon.  Past Medical History:  Diagnosis Date  . Allergy    allergy shots at [facility]  . Asthma   . Hypertension   . Sleep apnea     Past Surgical History:  Procedure Laterality Date  . ACHILLES TENDON REPAIR    . NASAL SINUS SURGERY      Current Medications: Current Meds  Medication Sig  . aspirin EC 81 MG tablet Take 1 tablet (81 mg total) by mouth daily.  . fluticasone (FLONASE) 50 MCG/ACT nasal spray Place 2 sprays into both nostrils daily.  . hydrALAZINE (APRESOLINE) 50 MG tablet TAKE 1 TABLET BY MOUTH THREE TIMES DAILY  . lisinopril (PRINIVIL,ZESTRIL) 10 MG tablet Take 1 tablet (10 mg total) by mouth daily.  . metoprolol succinate (TOPROL-XL) 25 MG 24 hr tablet Take 3 tablets (75 mg total) by mouth daily. Take with or immediately following a meal.    . Multiple Vitamins-Minerals (ADEKS) chewable tablet Chew 1 tablet by mouth daily.  . tadalafil (CIALIS) 5 MG tablet Take 1 tablet (5 mg total) by mouth daily as needed for erectile dysfunction.  . traZODone (DESYREL) 100 MG tablet Take 1 tablet (100 mg total) by mouth at bedtime.     Allergies:   Patient has no known allergies.   Social History   Socioeconomic History  . Marital status: Single    Spouse name: Not on file  . Number of children: 0  . Years of education: college  . Highest education level: Not on file  Occupational History    Employer: [employer]  Social Needs  . Financial resource strain: Not on file  . Food insecurity:    Worry: Not on file    Inability: Not on file  . Transportation needs:    Medical: Not on file    Non-medical: Not on file  Tobacco Use  . Smoking status: Former Smoker    Last attempt to quit: 06/30/2017    Years since quitting: 0.4  . Smokeless tobacco: Never Used  . Tobacco comment: 3 cig a day   Substance and Sexual Activity  . Alcohol use: No    Alcohol/week: 0.0 oz  . Drug use: No  . Sexual activity: Yes  Lifestyle  . Physical activity:  Days per week: Not on file    Minutes per session: Not on file  . Stress: Not on file  Relationships  . Social connections:    Talks on phone: Not on file    Gets together: Not on file    Attends religious service: Not on file    Active member of club or organization: Not on file    Attends meetings of clubs or organizations: Not on file    Relationship status: Not on file  Other Topics Concern  . Not on file  Social History Narrative   Marital status: married;       Children: none      Lives: with wife.      Employment: works at [employer]; Solicitor.      Tobacco: none      Alcohol: none      Drugs: none      Exercise: gym 4-5 days per week.   Denies  Caffeine use      Family History: The patient's family history includes Cancer in his mother; Hypertension in his father  and mother. ROS:   Please see the history of present illness.    All 14 point review of systems negative except as described per history of present illness  EKGs/Labs/Other Studies Reviewed:      Recent Labs: 09/08/2017: ALT 32; Hemoglobin 13.8 10/31/2017: BUN 11; Creatinine, Ser 0.85; Potassium 4.3; Sodium 139  Recent Lipid Panel    Component Value Date/Time   CHOL 179 09/08/2017 1138   TRIG 57 09/08/2017 1138   HDL 48 09/08/2017 1138   CHOLHDL 3.7 09/08/2017 1138   LDLCALC 120 (H) 09/08/2017 1138    Physical Exam:    VS:  BP 140/78   Pulse 68   Ht 5\' 11"  (1.803 m)   Wt 272 lb 1.9 oz (123.4 kg)   SpO2 96%   BMI 37.95 kg/m     Wt Readings from Last 3 Encounters:  12/12/17 272 lb 1.9 oz (123.4 kg)  10/31/17 269 lb 12.8 oz (122.4 kg)  09/27/17 257 lb 12.8 oz (116.9 kg)     GEN:  Well nourished, well developed in no acute distress HEENT: Normal NECK: No JVD; No carotid bruits LYMPHATICS: No lymphadenopathy CARDIAC: RRR, no murmurs, no rubs, no gallops RESPIRATORY:  Clear to auscultation without rales, wheezing or rhonchi  ABDOMEN: Soft, non-tender, non-distended MUSCULOSKELETAL:  No edema; No deformity  SKIN: Warm and dry LOWER EXTREMITIES: no swelling NEUROLOGIC:  Alert and oriented x 3 PSYCHIATRIC:  Normal affect   ASSESSMENT:    1. Essential hypertension   2. Obstructive sleep apnea syndrome   3. Palpitations    PLAN:    In order of problems listed above:  1. Essential hypertension: Plan as outlined above. 2. Selective sleep apnea.  Adjustment of mask is being made and hopefully he will feel better. 3. Palpitations denies having any.  I will give him still excuse from lifting heavy weight for another month.  And to see him back in 3 months   Medication Adjustments/Labs and Tests Ordered: Current medicines are reviewed at length with the patient today.  Concerns regarding medicines are outlined above.  No orders of the defined types were placed in  this encounter.  Medication changes: No orders of the defined types were placed in this encounter.   Signed, Georgeanna Lea, MD, [facility] 12/12/2017 10:17 AM    [facility]

## 2017-12-13 LAB — BASIC METABOLIC PANEL
BUN/Creatinine Ratio: 12 (ref 9–20)
BUN: 12 mg/dL (ref 6–24)
CALCIUM: 9 mg/dL (ref 8.7–10.2)
CO2: 23 mmol/L (ref 20–29)
Chloride: 105 mmol/L (ref 96–106)
Creatinine, Ser: 1.03 mg/dL (ref 0.76–1.27)
GFR calc non Af Amer: 82 mL/min/{1.73_m2} (ref >59)
GFR, EST AFRICAN AMERICAN: 95 mL/min/{1.73_m2} (ref >59)
Glucose: 91 mg/dL (ref 65–99)
Potassium: 3.8 mmol/L (ref 3.5–5.2)
Sodium: 142 mmol/L (ref 134–144)

## 2018-02-14 ENCOUNTER — Other Ambulatory Visit: Payer: Self-pay | Admitting: Emergency Medicine

## 2018-02-14 DIAGNOSIS — Z8659 Personal history of other mental and behavioral disorders: Secondary | ICD-10-CM

## 2018-02-14 DIAGNOSIS — Z87898 Personal history of other specified conditions: Secondary | ICD-10-CM

## 2018-02-14 NOTE — Telephone Encounter (Signed)
Trazodone 100 mg refill request  LOV 09/01/17 with Sagardia  LR:  09/01/17  #30   Refills:  3  [id number] - [place], [place]

## 2018-02-20 NOTE — Telephone Encounter (Signed)
Pt is calling back checking on status of medication

## 2018-03-01 ENCOUNTER — Ambulatory Visit (INDEPENDENT_AMBULATORY_CARE_PROVIDER_SITE_OTHER): Payer: Federal, State, Local not specified - PPO | Admitting: Emergency Medicine

## 2018-03-01 ENCOUNTER — Ambulatory Visit: Payer: Federal, State, Local not specified - PPO | Admitting: Emergency Medicine

## 2018-03-01 ENCOUNTER — Encounter: Payer: Self-pay | Admitting: Emergency Medicine

## 2018-03-01 ENCOUNTER — Other Ambulatory Visit: Payer: Self-pay

## 2018-03-01 VITALS — BP 160/90 | HR 56 | Temp 99.0°F | Resp 16 | Wt 269.6 lb

## 2018-03-01 DIAGNOSIS — S39012A Strain of muscle, fascia and tendon of lower back, initial encounter: Secondary | ICD-10-CM | POA: Diagnosis not present

## 2018-03-01 DIAGNOSIS — M545 Low back pain, unspecified: Secondary | ICD-10-CM

## 2018-03-01 NOTE — Progress Notes (Signed)
Dylan RockersMarty Washington 57 y.o.   Chief Complaint  Patient presents with  . Back Pain    lower x 2 days per patient has gotten better     HISTORY OF PRESENT ILLNESS: This is a 57 y.o. male complaining of low back pain for the past 2 to 3 days.  Constant sharp pain worse with lifting, pulling, or pushing heavy objects and better with rest.  No sciatica symptoms.  No bowel or bladder problems.  Denies any other significant symptoms.  HPI   Prior to Admission medications   Medication Sig Start Date End Date Taking? Authorizing Provider  aspirin EC 81 MG tablet Take 1 tablet (81 mg total) by mouth daily. 12/22/16  Yes Stallings, Zoe A, MD  fluticasone (FLONASE) 50 MCG/ACT nasal spray Place 2 sprays into both nostrils daily. 10/22/16  Yes Elvina SidleLauenstein, Kurt, MD  hydrALAZINE (APRESOLINE) 50 MG tablet TAKE 1 TABLET BY MOUTH THREE TIMES DAILY 08/08/17  Yes Brandey Vandalen, Eilleen KempfMiguel Jose, MD  metoprolol tartrate (LOPRESSOR) 25 MG tablet Take 1 tablet (25 mg total) by mouth 2 (two) times daily. 12/12/17 03/12/18 Yes Georgeanna LeaKrasowski, Robert J, MD  Multiple Vitamins-Minerals (ADEKS) chewable tablet Chew 1 tablet by mouth daily.   Yes [provider]  traZODone (DESYREL) 100 MG tablet TAKE 1 TABLET(100 MG) BY MOUTH AT BEDTIME 02/21/18  Yes Winona Sison, Eilleen KempfMiguel Jose, MD  lisinopril (PRINIVIL,ZESTRIL) 10 MG tablet Take 1 tablet (10 mg total) by mouth daily. 11/01/17 01/30/18  Georgeanna LeaKrasowski, Robert J, MD  tadalafil (CIALIS) 5 MG tablet Take 1 tablet (5 mg total) by mouth daily as needed for erectile dysfunction. Patient not taking: Reported on 03/01/2018 11/22/16   Doristine BosworthStallings, Zoe A, MD    No Known Allergies  Patient Active Problem List   Diagnosis Date Noted  . Medication side effects 09/19/2017  . Chest pain 09/08/2017  . Palpitations 09/08/2017  . Chronic nasal congestion 08/16/2017  . History of depression 08/16/2017  . History of insomnia 08/16/2017  . Insomnia 08/01/2017  . Strain of lumbar region 04/14/2017  . Acute  midline low back pain without sciatica 04/14/2017  . Chronic tension-type headache, not intractable 11/14/2016  . Bone spur of foot 02/08/2016  . RBBB 04/02/2014  . Asthma 01/17/2012  . Environmental allergies 08/16/2011  . Hypertension   . Sleep apnea     Past Medical History:  Diagnosis Date  . Allergy    allergy shots at [facility]  . Asthma   . Hypertension   . Sleep apnea     Past Surgical History:  Procedure Laterality Date  . ACHILLES TENDON REPAIR    . NASAL SINUS SURGERY      Social History   Socioeconomic History  . Marital status: Single    Spouse name: Not on file  . Number of children: 0  . Years of education: college  . Highest education level: Not on file  Occupational History    Employer: [employer]  Social Needs  . Financial resource strain: Not on file  . Food insecurity:    Worry: Not on file    Inability: Not on file  . Transportation needs:    Medical: Not on file    Non-medical: Not on file  Tobacco Use  . Smoking status: Former Smoker    Last attempt to quit: 06/30/2017    Years since quitting: 0.6  . Smokeless tobacco: Never Used  . Tobacco comment: 3 cig a day   Substance and Sexual Activity  . Alcohol use:  No    Alcohol/week: 0.0 standard drinks  . Drug use: No  . Sexual activity: Yes  Lifestyle  . Physical activity:    Days per week: Not on file    Minutes per session: Not on file  . Stress: Not on file  Relationships  . Social connections:    Talks on phone: Not on file    Gets together: Not on file    Attends religious service: Not on file    Active member of club or organization: Not on file    Attends meetings of clubs or organizations: Not on file    Relationship status: Not on file  . Intimate partner violence:    Fear of current or ex partner: Not on file    Emotionally abused: Not on file    Physically abused: Not on file    Forced sexual activity: Not on file  Other Topics Concern  . Not on file    Social History Narrative   Marital status: married;       Children: none      Lives: with wife.      Employment: works at [employer]; Solicitor.      Tobacco: none      Alcohol: none      Drugs: none      Exercise: gym 4-5 days per week.   Denies  Caffeine use     Family History  Problem Relation Age of Onset  . Hypertension Mother   . Cancer Mother   . Hypertension Father      Review of Systems  Constitutional: Negative.  Negative for chills and fever.  HENT: Negative.   Eyes: Negative.   Respiratory: Negative.  Negative for cough and shortness of breath.   Cardiovascular: Negative.  Negative for chest pain and palpitations.  Gastrointestinal: Negative.  Negative for abdominal pain, nausea and vomiting.  Genitourinary: Negative.  Negative for dysuria and hematuria.  Musculoskeletal: Positive for back pain.  Skin: Negative.  Negative for rash.  Neurological: Negative.  Negative for dizziness, sensory change, focal weakness and headaches.  Endo/Heme/Allergies: Negative.   All other systems reviewed and are negative.  Vitals:   03/01/18 1220  BP: (!) 160/90  Pulse: (!) 56  Resp: 16  Temp: 99 F (37.2 C)  SpO2: 100%     Physical Exam  Constitutional: He is oriented to person, place, and time. He appears well-developed and well-nourished.  HENT:  Head: Normocephalic and atraumatic.  Eyes: Pupils are equal, round, and reactive to light. Conjunctivae and EOM are normal.  Neck: Normal range of motion. Neck supple. No JVD present.  Cardiovascular: Normal rate and regular rhythm.  Pulmonary/Chest: Effort normal and breath sounds normal.  Abdominal: Soft. Bowel sounds are normal. He exhibits no distension. There is no tenderness.  Musculoskeletal:       Lumbar back: He exhibits decreased range of motion and tenderness. He exhibits no bony tenderness, no spasm and normal pulse.  Neurological: He is alert and oriented to person, place, and time. He displays normal  reflexes. No sensory deficit. He exhibits normal muscle tone.  Skin: Skin is warm and dry. Capillary refill takes less than 2 seconds.  Psychiatric: He has a normal mood and affect. His behavior is normal.  Vitals reviewed.  A total of 25 minutes was spent in the room with the patient, greater than 50% of which was in counseling/coordination of care regarding differential diagnosis, treatment, medications, physical activity and need for follow-up.  ASSESSMENT & PLAN: FMLA papers filled out.   Dylan Washington was seen today for back pain.  Diagnoses and all orders for this visit:  Acute myofascial strain of lumbar region, initial encounter  Lumbar pain    Patient Instructions       If you have lab work done today you will be contacted with your lab results within the next 2 weeks.  If you have not heard from [place] then please contact us. The fastest way to get your results is to register for My Chart.   IF you received an x-ray today, you will receive an invoice from [facility]. Please contact [facility] at [phone] with questions or concerns regarding your invoice.   IF you received labwork today, you will receive an invoice from [place]. Please contact [facility] at [phone] with questions or concerns regarding your invoice.   Our billing staff will not be able to assist you with questions regarding bills from these companies.  You will be contacted with the lab results as soon as they are available. The fastest way to get your results is to activate your My Chart account. Instructions are located on the last page of this paperwork. If you have not heard from [place] regarding the results in 2 weeks, please contact this office.     Back Pain, Adult Back pain is very common. The pain often gets better over time. The cause of back pain is usually not dangerous. Most people can learn to manage their back pain on their own. Follow these instructions at home: Watch  your back pain for any changes. The following actions may help to lessen any pain you are feeling:  Stay active. Start with short walks on flat ground if you can. Try to walk farther each day.  Exercise regularly as told by your doctor. Exercise helps your back heal faster. It also helps avoid future injury by keeping your muscles strong and flexible.  Do not sit, drive, or stand in one place for more than 30 minutes.  Do not stay in bed. Resting more than 1-2 days can slow down your recovery.  Be careful when you bend or lift an object. Use good form when lifting: ? Bend at your knees. ? Keep the object close to your body. ? Do not twist.  Sleep on a firm mattress. Lie on your side, and bend your knees. If you lie on your back, put a pillow under your knees.  Take medicines only as told by your doctor.  Put ice on the injured area. ? Put ice in a plastic bag. ? Place a towel between your skin and the bag. ? Leave the ice on for 20 minutes, 2-3 times a day for the first 2-3 days. After that, you can switch between ice and heat packs.  Avoid feeling anxious or stressed. Find good ways to deal with stress, such as exercise.  Maintain a healthy weight. Extra weight puts stress on your back.  Contact a doctor if:  You have pain that does not go away with rest or medicine.  You have worsening pain that goes down into your legs or buttocks.  You have pain that does not get better in one week.  You have pain at night.  You lose weight.  You have a fever or chills. Get help right away if:  You cannot control when you poop (bowel movement) or pee (urinate).  Your arms or legs feel weak.  Your arms or legs lose  feeling (numbness).  You feel sick to your stomach (nauseous) or throw up (vomit).  You have belly (abdominal) pain.  You feel like you may pass out (faint). This information is not intended to replace advice given to you by your health care provider. Make sure you  discuss any questions you have with your health care provider. Document Released: 12/14/2007 Document Revised: 12/03/2015 Document Reviewed: 10/29/2013 Elsevier Interactive Patient Education  2018 Elsevier Inc.     Edwina Barth, MD [facility]

## 2018-03-01 NOTE — Patient Instructions (Addendum)
   If you have lab work done today you will be contacted with your lab results within the next 2 weeks.  If you have not heard from us then please contact us. The fastest way to get your results is to register for My Chart.   IF you received an x-ray today, you will receive an invoice from [facility]. Please contact [facility] at [phone] with questions or concerns regarding your invoice.   IF you received labwork today, you will receive an invoice from [facility]. Please contact [facility] at [phone] with questions or concerns regarding your invoice.   Our billing staff will not be able to assist you with questions regarding bills from these companies.  You will be contacted with the lab results as soon as they are available. The fastest way to get your results is to activate your My Chart account. Instructions are located on the last page of this paperwork. If you have not heard from us regarding the results in 2 weeks, please contact this office.     Back Pain, Adult Back pain is very common. The pain often gets better over time. The cause of back pain is usually not dangerous. Most people can learn to manage their back pain on their own. Follow these instructions at home: Watch your back pain for any changes. The following actions may help to lessen any pain you are feeling:  Stay active. Start with short walks on flat ground if you can. Try to walk farther each day.  Exercise regularly as told by your doctor. Exercise helps your back heal faster. It also helps avoid future injury by keeping your muscles strong and flexible.  Do not sit, drive, or stand in one place for more than 30 minutes.  Do not stay in bed. Resting more than 1-2 days can slow down your recovery.  Be careful when you bend or lift an object. Use good form when lifting: ? Bend at your knees. ? Keep the object close to your body. ? Do not twist.  Sleep on a firm mattress. Lie on your  side, and bend your knees. If you lie on your back, put a pillow under your knees.  Take medicines only as told by your doctor.  Put ice on the injured area. ? Put ice in a plastic bag. ? Place a towel between your skin and the bag. ? Leave the ice on for 20 minutes, 2-3 times a day for the first 2-3 days. After that, you can switch between ice and heat packs.  Avoid feeling anxious or stressed. Find good ways to deal with stress, such as exercise.  Maintain a healthy weight. Extra weight puts stress on your back.  Contact a doctor if:  You have pain that does not go away with rest or medicine.  You have worsening pain that goes down into your legs or buttocks.  You have pain that does not get better in one week.  You have pain at night.  You lose weight.  You have a fever or chills. Get help right away if:  You cannot control when you poop (bowel movement) or pee (urinate).  Your arms or legs feel weak.  Your arms or legs lose feeling (numbness).  You feel sick to your stomach (nauseous) or throw up (vomit).  You have belly (abdominal) pain.  You feel like you may pass out (faint). This information is not intended to replace advice given to you by your health care   provider. Make sure you discuss any questions you have with your health care provider. Document Released: 12/14/2007 Document Revised: 12/03/2015 Document Reviewed: 10/29/2013 Elsevier Interactive Patient Education  2018 Elsevier Inc.  

## 2018-03-13 ENCOUNTER — Ambulatory Visit: Payer: Federal, State, Local not specified - PPO | Admitting: Emergency Medicine

## 2018-03-15 ENCOUNTER — Ambulatory Visit: Payer: Federal, State, Local not specified - PPO | Admitting: Emergency Medicine

## 2018-03-16 ENCOUNTER — Ambulatory Visit (INDEPENDENT_AMBULATORY_CARE_PROVIDER_SITE_OTHER): Payer: Federal, State, Local not specified - PPO

## 2018-03-16 ENCOUNTER — Other Ambulatory Visit: Payer: Self-pay

## 2018-03-16 ENCOUNTER — Ambulatory Visit: Payer: Federal, State, Local not specified - PPO | Admitting: Emergency Medicine

## 2018-03-16 ENCOUNTER — Encounter: Payer: Self-pay | Admitting: Emergency Medicine

## 2018-03-16 VITALS — BP 170/80 | HR 86 | Temp 98.0°F | Resp 16 | Ht 71.0 in | Wt 274.0 lb

## 2018-03-16 DIAGNOSIS — M545 Low back pain, unspecified: Secondary | ICD-10-CM

## 2018-03-16 DIAGNOSIS — M51369 Other intervertebral disc degeneration, lumbar region without mention of lumbar back pain or lower extremity pain: Secondary | ICD-10-CM

## 2018-03-16 DIAGNOSIS — M48061 Spinal stenosis, lumbar region without neurogenic claudication: Secondary | ICD-10-CM | POA: Diagnosis not present

## 2018-03-16 DIAGNOSIS — M5136 Other intervertebral disc degeneration, lumbar region: Secondary | ICD-10-CM

## 2018-03-16 NOTE — Progress Notes (Signed)
Dylan Washington 57 y.o.   Chief Complaint  Patient presents with  . fmla    paperwork to be filled out    HISTORY OF PRESENT ILLNESS: This is a 57 y.o. male with a chronic intermittent lumbar pain here today to have FMLA paperwork completed.  HPI   Prior to Admission medications   Medication Sig Start Date End Date Taking? Authorizing Provider  aspirin EC 81 MG tablet Take 1 tablet (81 mg total) by mouth daily. 12/22/16  Yes Stallings, Zoe A, MD  fluticasone (FLONASE) 50 MCG/ACT nasal spray Place 2 sprays into both nostrils daily. 10/22/16  Yes Elvina Sidle, MD  hydrALAZINE (APRESOLINE) 50 MG tablet TAKE 1 TABLET BY MOUTH THREE TIMES DAILY 08/08/17  Yes Accalia Rigdon, Eilleen Kempf, MD  Multiple Vitamins-Minerals (ADEKS) chewable tablet Chew 1 tablet by mouth daily.   Yes [provider]  tadalafil (CIALIS) 5 MG tablet Take 1 tablet (5 mg total) by mouth daily as needed for erectile dysfunction. 11/22/16  Yes Doristine Bosworth, MD  traZODone (DESYREL) 100 MG tablet TAKE 1 TABLET(100 MG) BY MOUTH AT BEDTIME 02/21/18  Yes Marelyn Rouser, Eilleen Kempf, MD  lisinopril (PRINIVIL,ZESTRIL) 10 MG tablet Take 1 tablet (10 mg total) by mouth daily. 11/01/17 01/30/18  Georgeanna Lea, MD  metoprolol tartrate (LOPRESSOR) 25 MG tablet Take 1 tablet (25 mg total) by mouth 2 (two) times daily. 12/12/17 03/12/18  Georgeanna Lea, MD    No Known Allergies  Patient Active Problem List   Diagnosis Date Noted  . Medication side effects 09/19/2017  . Chest pain 09/08/2017  . Palpitations 09/08/2017  . Chronic nasal congestion 08/16/2017  . History of depression 08/16/2017  . History of insomnia 08/16/2017  . Insomnia 08/01/2017  . Strain of lumbar region 04/14/2017  . Acute midline low back pain without sciatica 04/14/2017  . Chronic tension-type headache, not intractable 11/14/2016  . Bone spur of foot 02/08/2016  . RBBB 04/02/2014  . Asthma 01/17/2012  . Environmental allergies 08/16/2011  .  Hypertension   . Sleep apnea     Past Medical History:  Diagnosis Date  . Allergy    allergy shots at [facility]  . Asthma   . Hypertension   . Sleep apnea     Past Surgical History:  Procedure Laterality Date  . ACHILLES TENDON REPAIR    . NASAL SINUS SURGERY      Social History   Socioeconomic History  . Marital status: Single    Spouse name: Not on file  . Number of children: 0  . Years of education: college  . Highest education level: Not on file  Occupational History    Employer: [employer]  Social Needs  . Financial resource strain: Not on file  . Food insecurity:    Worry: Not on file    Inability: Not on file  . Transportation needs:    Medical: Not on file    Non-medical: Not on file  Tobacco Use  . Smoking status: Former Smoker    Last attempt to quit: 06/30/2017    Years since quitting: 0.7  . Smokeless tobacco: Never Used  . Tobacco comment: 3 cig a day   Substance and Sexual Activity  . Alcohol use: No    Alcohol/week: 0.0 standard drinks  . Drug use: No  . Sexual activity: Yes  Lifestyle  . Physical activity:    Days per week: Not on file    Minutes per session: Not on file  .  Stress: Not on file  Relationships  . Social connections:    Talks on phone: Not on file    Gets together: Not on file    Attends religious service: Not on file    Active member of club or organization: Not on file    Attends meetings of clubs or organizations: Not on file    Relationship status: Not on file  . Intimate partner violence:    Fear of current or ex partner: Not on file    Emotionally abused: Not on file    Physically abused: Not on file    Forced sexual activity: Not on file  Other Topics Concern  . Not on file  Social History Narrative   Marital status: married;       Children: none      Lives: with wife.      Employment: works at [employer]; Solicitor.      Tobacco: none      Alcohol: none      Drugs: none      Exercise: gym  4-5 days per week.   Denies  Caffeine use     Family History  Problem Relation Age of Onset  . Hypertension Mother   . Cancer Mother   . Hypertension Father      Review of Systems  Constitutional: Negative.  Negative for chills and fever.  HENT: Negative.  Negative for sore throat.   Respiratory: Negative.  Negative for cough and shortness of breath.   Cardiovascular: Negative.  Negative for chest pain and palpitations.  Gastrointestinal: Negative for abdominal pain, diarrhea, nausea and vomiting.  Genitourinary: Negative for dysuria and hematuria.  Musculoskeletal: Positive for back pain.  Skin: Negative.  Negative for rash.  Neurological: Negative for dizziness, sensory change, focal weakness and headaches.  All other systems reviewed and are negative.    Vitals:   03/16/18 1028  BP: (!) 170/80  Pulse: 86  Resp: 16  Temp: 98 F (36.7 C)  SpO2: 96%      Physical Exam  Constitutional: He is oriented to person, place, and time. He appears well-developed and well-nourished.  HENT:  Head: Normocephalic and atraumatic.  Eyes: Pupils are equal, round, and reactive to light.  Neck: Normal range of motion. Neck supple.  Cardiovascular: Normal rate.  Pulmonary/Chest: Effort normal.  Abdominal: Soft. There is no tenderness.  Musculoskeletal: Normal range of motion.  Neurological: He is alert and oriented to person, place, and time. No sensory deficit. He exhibits normal muscle tone.  Skin: Skin is warm and dry. Capillary refill takes less than 2 seconds.  Psychiatric: He has a normal mood and affect. His behavior is normal.  Vitals reviewed.    ASSESSMENT & PLAN: Dylan Washington was seen today for fmla.  Diagnoses and all orders for this visit:  Lumbar pain -     DG Lumbar Spine 2-3 Views; Future  Degenerative disc disease, lumbar   FMLA paperwork finished.   Patient Instructions       If you have lab work done today you will be contacted with your lab results  within the next 2 weeks.  If you have not heard from [place] then please contact us. The fastest way to get your results is to register for My Chart.   IF you received an x-ray today, you will receive an invoice from [facility]. Please contact [facility] at [phone] with questions or concerns regarding your invoice.   IF you received labwork today, you will  receive an invoice from [facility]. Please contact [facility] at [phone] with questions or concerns regarding your invoice.   Our billing staff will not be able to assist you with questions regarding bills from these companies.  You will be contacted with the lab results as soon as they are available. The fastest way to get your results is to activate your My Chart account. Instructions are located on the last page of this paperwork. If you have not heard from [place] regarding the results in 2 weeks, please contact this office.      Back Pain, Adult Back pain is very common. The pain often gets better over time. The cause of back pain is usually not dangerous. Most people can learn to manage their back pain on their own. Follow these instructions at home: Watch your back pain for any changes. The following actions may help to lessen any pain you are feeling:  Stay active. Start with short walks on flat ground if you can. Try to walk farther each day.  Exercise regularly as told by your doctor. Exercise helps your back heal faster. It also helps avoid future injury by keeping your muscles strong and flexible.  Do not sit, drive, or stand in one place for more than 30 minutes.  Do not stay in bed. Resting more than 1-2 days can slow down your recovery.  Be careful when you bend or lift an object. Use good form when lifting: ? Bend at your knees. ? Keep the object close to your body. ? Do not twist.  Sleep on a firm mattress. Lie on your side, and bend your knees. If you lie on your back, put a pillow under your  knees.  Take medicines only as told by your doctor.  Put ice on the injured area. ? Put ice in a plastic bag. ? Place a towel between your skin and the bag. ? Leave the ice on for 20 minutes, 2-3 times a day for the first 2-3 days. After that, you can switch between ice and heat packs.  Avoid feeling anxious or stressed. Find good ways to deal with stress, such as exercise.  Maintain a healthy weight. Extra weight puts stress on your back.  Contact a doctor if:  You have pain that does not go away with rest or medicine.  You have worsening pain that goes down into your legs or buttocks.  You have pain that does not get better in one week.  You have pain at night.  You lose weight.  You have a fever or chills. Get help right away if:  You cannot control when you poop (bowel movement) or pee (urinate).  Your arms or legs feel weak.  Your arms or legs lose feeling (numbness).  You feel sick to your stomach (nauseous) or throw up (vomit).  You have belly (abdominal) pain.  You feel like you may pass out (faint). This information is not intended to replace advice given to you by your health care provider. Make sure you discuss any questions you have with your health care provider. Document Released: 12/14/2007 Document Revised: 12/03/2015 Document Reviewed: 10/29/2013 Elsevier Interactive Patient Education  2018 Elsevier Inc.      Edwina Barth, MD [facility]

## 2018-03-16 NOTE — Patient Instructions (Addendum)
   If you have lab work done today you will be contacted with your lab results within the next 2 weeks.  If you have not heard from us then please contact us. The fastest way to get your results is to register for My Chart.   IF you received an x-ray today, you will receive an invoice from [facility]. Please contact [facility] at [phone] with questions or concerns regarding your invoice.   IF you received labwork today, you will receive an invoice from [facility]. Please contact [facility] at [phone] with questions or concerns regarding your invoice.   Our billing staff will not be able to assist you with questions regarding bills from these companies.  You will be contacted with the lab results as soon as they are available. The fastest way to get your results is to activate your My Chart account. Instructions are located on the last page of this paperwork. If you have not heard from us regarding the results in 2 weeks, please contact this office.     Back Pain, Adult Back pain is very common. The pain often gets better over time. The cause of back pain is usually not dangerous. Most people can learn to manage their back pain on their own. Follow these instructions at home: Watch your back pain for any changes. The following actions may help to lessen any pain you are feeling:  Stay active. Start with short walks on flat ground if you can. Try to walk farther each day.  Exercise regularly as told by your doctor. Exercise helps your back heal faster. It also helps avoid future injury by keeping your muscles strong and flexible.  Do not sit, drive, or stand in one place for more than 30 minutes.  Do not stay in bed. Resting more than 1-2 days can slow down your recovery.  Be careful when you bend or lift an object. Use good form when lifting: ? Bend at your knees. ? Keep the object close to your body. ? Do not twist.  Sleep on a firm mattress. Lie on your  side, and bend your knees. If you lie on your back, put a pillow under your knees.  Take medicines only as told by your doctor.  Put ice on the injured area. ? Put ice in a plastic bag. ? Place a towel between your skin and the bag. ? Leave the ice on for 20 minutes, 2-3 times a day for the first 2-3 days. After that, you can switch between ice and heat packs.  Avoid feeling anxious or stressed. Find good ways to deal with stress, such as exercise.  Maintain a healthy weight. Extra weight puts stress on your back.  Contact a doctor if:  You have pain that does not go away with rest or medicine.  You have worsening pain that goes down into your legs or buttocks.  You have pain that does not get better in one week.  You have pain at night.  You lose weight.  You have a fever or chills. Get help right away if:  You cannot control when you poop (bowel movement) or pee (urinate).  Your arms or legs feel weak.  Your arms or legs lose feeling (numbness).  You feel sick to your stomach (nauseous) or throw up (vomit).  You have belly (abdominal) pain.  You feel like you may pass out (faint). This information is not intended to replace advice given to you by your health care   provider. Make sure you discuss any questions you have with your health care provider. Document Released: 12/14/2007 Document Revised: 12/03/2015 Document Reviewed: 10/29/2013 Elsevier Interactive Patient Education  2018 Elsevier Inc.  

## 2018-03-24 DIAGNOSIS — I4510 Unspecified right bundle-branch block: Secondary | ICD-10-CM | POA: Diagnosis not present

## 2018-03-24 DIAGNOSIS — J069 Acute upper respiratory infection, unspecified: Secondary | ICD-10-CM | POA: Diagnosis not present

## 2018-03-24 DIAGNOSIS — R42 Dizziness and giddiness: Secondary | ICD-10-CM | POA: Diagnosis not present

## 2018-03-24 DIAGNOSIS — I10 Essential (primary) hypertension: Secondary | ICD-10-CM | POA: Diagnosis not present

## 2018-03-24 DIAGNOSIS — Z888 Allergy status to other drugs, medicaments and biological substances status: Secondary | ICD-10-CM | POA: Diagnosis not present

## 2018-03-24 DIAGNOSIS — Z87891 Personal history of nicotine dependence: Secondary | ICD-10-CM | POA: Diagnosis not present

## 2018-03-24 DIAGNOSIS — R7611 Nonspecific reaction to tuberculin skin test without active tuberculosis: Secondary | ICD-10-CM | POA: Diagnosis not present

## 2018-04-10 ENCOUNTER — Other Ambulatory Visit: Payer: Self-pay | Admitting: Emergency Medicine

## 2018-04-10 DIAGNOSIS — Z8659 Personal history of other mental and behavioral disorders: Secondary | ICD-10-CM

## 2018-04-10 DIAGNOSIS — Z87898 Personal history of other specified conditions: Secondary | ICD-10-CM

## 2018-04-10 NOTE — Telephone Encounter (Signed)
Requested medication (s) are due for refill today: yes  Requested medication (s) are on the active medication list: yes    Last refill: Sept 2019  Future visit scheduled no  Notes to clinic:Depression assessed in Feb.  Pt was asked to come back in 3 months for depressive symptoms  Requested Prescriptions  Pending Prescriptions Disp Refills   traZODone (DESYREL) 100 MG tablet [Pharmacy Med Name: TRAZODONE 100MG  TABLETS] 30 tablet 0    Sig: TAKE 1 TABLET(100 MG) BY MOUTH AT BEDTIME     Psychiatry: Antidepressants - Serotonin Modulator Passed - 04/10/2018  3:59 PM      Passed - Valid encounter within last 6 months    Recent Outpatient Visits          3 weeks ago Lumbar pain   [facility] at [facility], Eilleen Kempf, MD   1 month ago Acute myofascial strain of lumbar region, initial encounter   Primary [facility] at [facility], [place], MD   6 months ago Essential hypertension   [facility] at [facility], Wiggins, MD   7 months ago Chest pain, unspecified type   Primary Care at [facility], Eilleen Kempf, MD   7 months ago Depression with anxiety   Primary Care at [facility], [place], MD

## 2018-04-11 NOTE — Telephone Encounter (Signed)
Patient requesting refill on Trazodone 100 mg, please advise. Thank you.

## 2018-04-18 DIAGNOSIS — Z0271 Encounter for disability determination: Secondary | ICD-10-CM

## 2018-04-27 DIAGNOSIS — G4733 Obstructive sleep apnea (adult) (pediatric): Secondary | ICD-10-CM | POA: Diagnosis not present

## 2018-04-29 DIAGNOSIS — G4700 Insomnia, unspecified: Secondary | ICD-10-CM | POA: Diagnosis not present

## 2018-04-29 DIAGNOSIS — R51 Headache: Secondary | ICD-10-CM | POA: Diagnosis not present

## 2018-04-29 DIAGNOSIS — J302 Other seasonal allergic rhinitis: Secondary | ICD-10-CM | POA: Diagnosis not present

## 2018-04-29 DIAGNOSIS — R42 Dizziness and giddiness: Secondary | ICD-10-CM | POA: Diagnosis not present

## 2018-04-29 DIAGNOSIS — Z87891 Personal history of nicotine dependence: Secondary | ICD-10-CM | POA: Diagnosis not present

## 2018-04-29 DIAGNOSIS — F329 Major depressive disorder, single episode, unspecified: Secondary | ICD-10-CM | POA: Diagnosis not present

## 2018-04-29 DIAGNOSIS — Z79899 Other long term (current) drug therapy: Secondary | ICD-10-CM | POA: Diagnosis not present

## 2018-04-29 DIAGNOSIS — G4733 Obstructive sleep apnea (adult) (pediatric): Secondary | ICD-10-CM | POA: Diagnosis not present

## 2018-04-29 DIAGNOSIS — I10 Essential (primary) hypertension: Secondary | ICD-10-CM | POA: Diagnosis not present

## 2018-04-29 DIAGNOSIS — R112 Nausea with vomiting, unspecified: Secondary | ICD-10-CM | POA: Diagnosis not present

## 2018-04-29 DIAGNOSIS — I4510 Unspecified right bundle-branch block: Secondary | ICD-10-CM | POA: Diagnosis not present

## 2018-04-29 DIAGNOSIS — Z888 Allergy status to other drugs, medicaments and biological substances status: Secondary | ICD-10-CM | POA: Diagnosis not present

## 2018-04-29 DIAGNOSIS — J45909 Unspecified asthma, uncomplicated: Secondary | ICD-10-CM | POA: Diagnosis not present

## 2018-05-01 DIAGNOSIS — Z23 Encounter for immunization: Secondary | ICD-10-CM | POA: Diagnosis not present

## 2018-05-01 DIAGNOSIS — Z1159 Encounter for screening for other viral diseases: Secondary | ICD-10-CM | POA: Diagnosis not present

## 2018-05-01 DIAGNOSIS — I161 Hypertensive emergency: Secondary | ICD-10-CM | POA: Diagnosis not present

## 2018-05-01 DIAGNOSIS — K5900 Constipation, unspecified: Secondary | ICD-10-CM | POA: Diagnosis not present

## 2018-05-01 DIAGNOSIS — I10 Essential (primary) hypertension: Secondary | ICD-10-CM | POA: Diagnosis not present

## 2018-05-05 DIAGNOSIS — R110 Nausea: Secondary | ICD-10-CM | POA: Diagnosis not present

## 2018-05-05 DIAGNOSIS — R42 Dizziness and giddiness: Secondary | ICD-10-CM | POA: Diagnosis not present

## 2018-05-08 DIAGNOSIS — I161 Hypertensive emergency: Secondary | ICD-10-CM | POA: Diagnosis not present

## 2018-05-08 DIAGNOSIS — K644 Residual hemorrhoidal skin tags: Secondary | ICD-10-CM | POA: Diagnosis not present

## 2018-05-08 DIAGNOSIS — D72829 Elevated white blood cell count, unspecified: Secondary | ICD-10-CM | POA: Diagnosis not present

## 2018-05-08 DIAGNOSIS — I10 Essential (primary) hypertension: Secondary | ICD-10-CM | POA: Diagnosis not present

## 2018-05-08 DIAGNOSIS — R42 Dizziness and giddiness: Secondary | ICD-10-CM | POA: Diagnosis not present

## 2018-05-14 IMAGING — DX DG CHEST 2V
3 series · 3 of 3 positions shown · non-contrast
Comparison: 06/16/2016

CLINICAL DATA: Chest pain and palpitations.

EXAM:
CHEST  2 VIEW

[chest pa (1 of 2)]
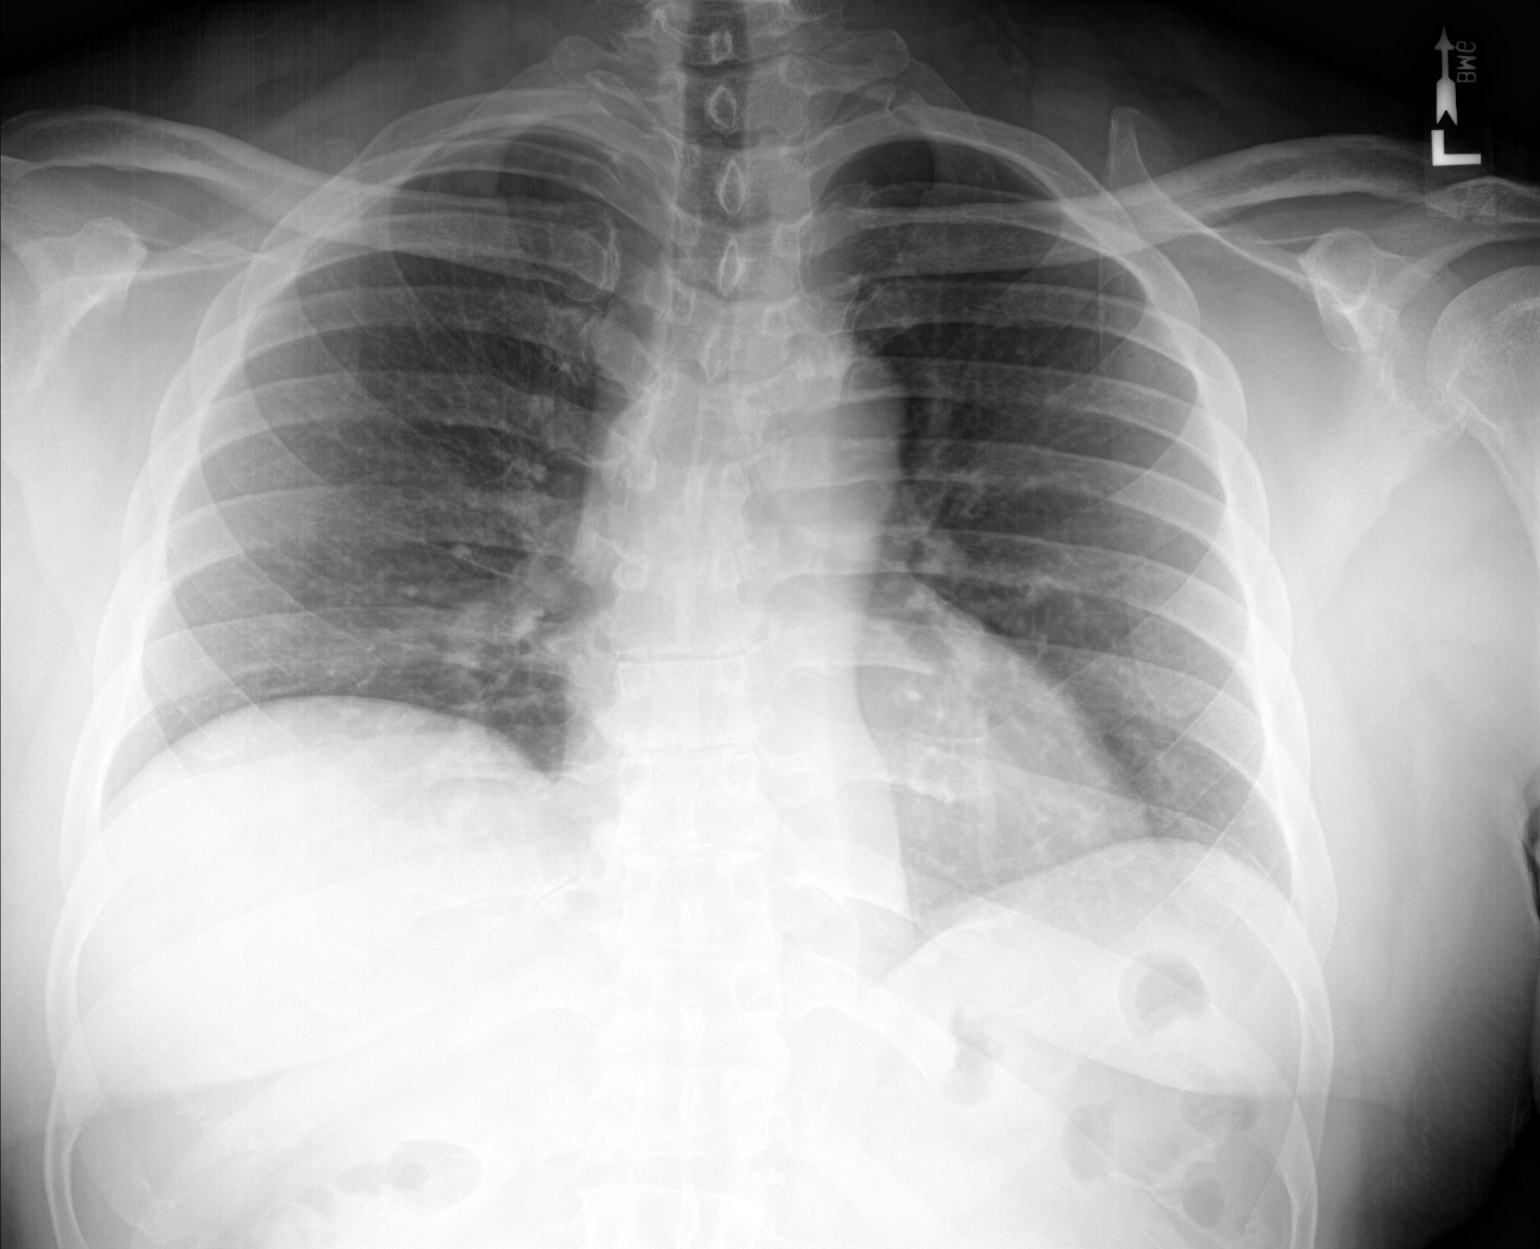

[chest lat]
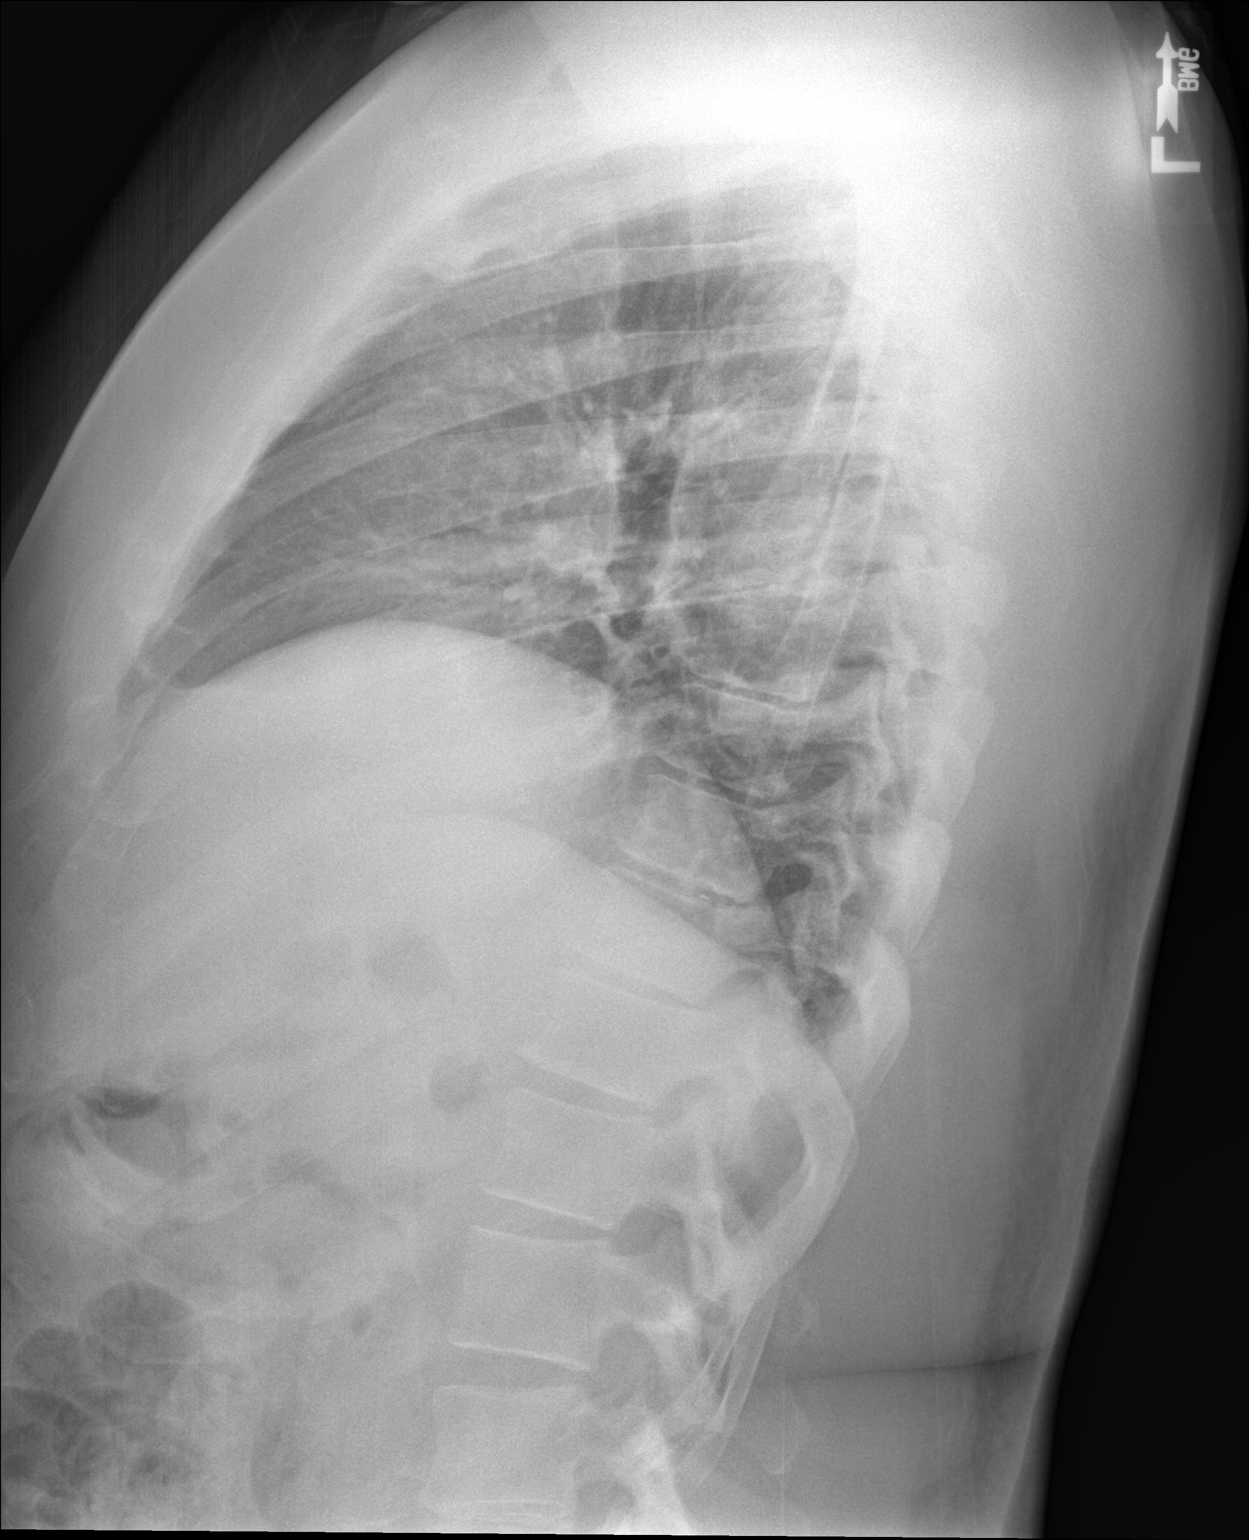

[chest pa (2 of 2)]
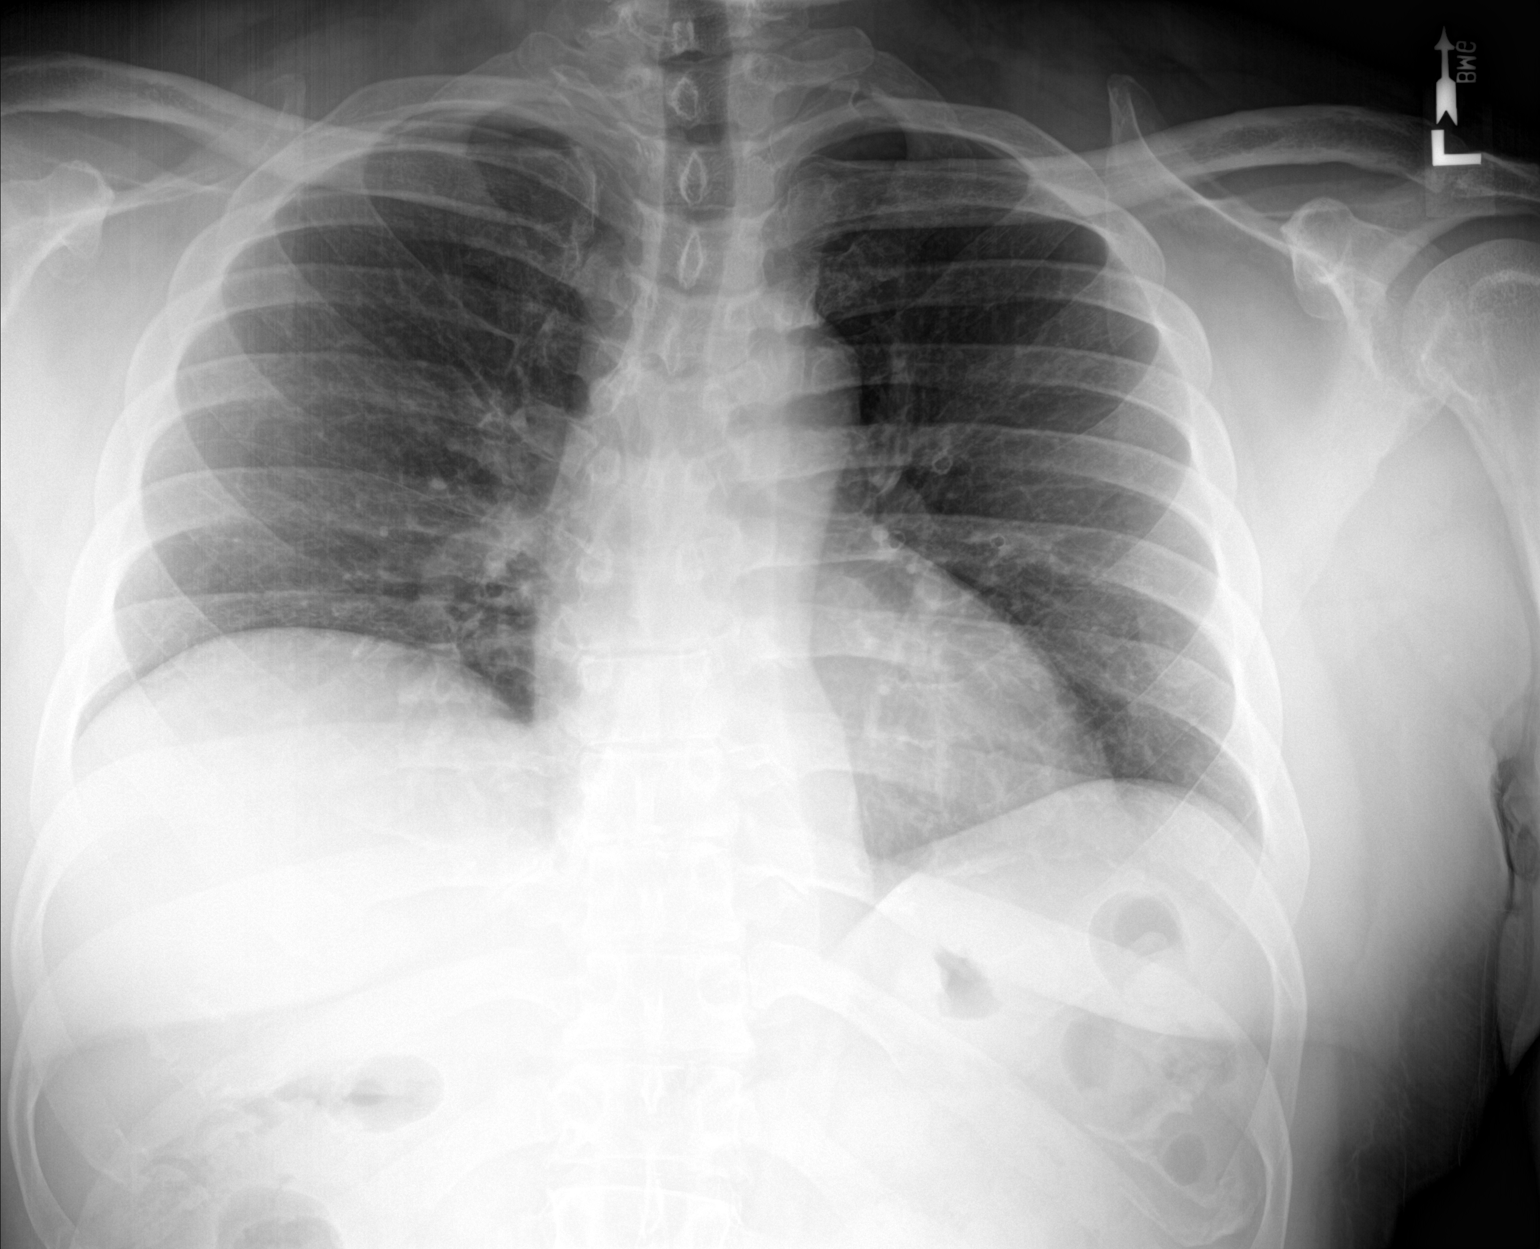

[3 of 3 positions shown; findings below may reference images not displayed]

FINDINGS: Moderate right hemidiaphragm elevation. Midline trachea. Normal
heart size. Tortuous thoracic aorta. No pleural effusion or
pneumothorax. Low lung volumes. Clear lungs.
IMPRESSION: Low lung volumes and right hemidiaphragm elevation. No acute
findings.

## 2018-05-20 ENCOUNTER — Other Ambulatory Visit: Payer: Self-pay | Admitting: Emergency Medicine

## 2018-05-20 DIAGNOSIS — Z87898 Personal history of other specified conditions: Secondary | ICD-10-CM

## 2018-05-20 DIAGNOSIS — Z8659 Personal history of other mental and behavioral disorders: Secondary | ICD-10-CM

## 2018-05-22 DIAGNOSIS — I10 Essential (primary) hypertension: Secondary | ICD-10-CM | POA: Diagnosis not present

## 2018-05-22 DIAGNOSIS — G4730 Sleep apnea, unspecified: Secondary | ICD-10-CM | POA: Diagnosis not present

## 2018-05-23 DIAGNOSIS — R0981 Nasal congestion: Secondary | ICD-10-CM | POA: Diagnosis not present

## 2018-05-23 DIAGNOSIS — G4700 Insomnia, unspecified: Secondary | ICD-10-CM | POA: Diagnosis not present

## 2018-05-23 DIAGNOSIS — R51 Headache: Secondary | ICD-10-CM | POA: Diagnosis not present

## 2018-05-23 DIAGNOSIS — G4733 Obstructive sleep apnea (adult) (pediatric): Secondary | ICD-10-CM | POA: Diagnosis not present

## 2018-05-24 DIAGNOSIS — Z9109 Other allergy status, other than to drugs and biological substances: Secondary | ICD-10-CM | POA: Diagnosis not present

## 2018-05-24 DIAGNOSIS — I7789 Other specified disorders of arteries and arterioles: Secondary | ICD-10-CM | POA: Diagnosis not present

## 2018-05-24 DIAGNOSIS — I10 Essential (primary) hypertension: Secondary | ICD-10-CM | POA: Diagnosis not present

## 2018-05-24 DIAGNOSIS — B9689 Other specified bacterial agents as the cause of diseases classified elsewhere: Secondary | ICD-10-CM | POA: Diagnosis not present

## 2018-05-24 DIAGNOSIS — J019 Acute sinusitis, unspecified: Secondary | ICD-10-CM | POA: Diagnosis not present

## 2018-05-24 DIAGNOSIS — R42 Dizziness and giddiness: Secondary | ICD-10-CM | POA: Diagnosis not present

## 2018-05-29 DIAGNOSIS — I10 Essential (primary) hypertension: Secondary | ICD-10-CM | POA: Diagnosis not present

## 2018-06-13 DIAGNOSIS — I10 Essential (primary) hypertension: Secondary | ICD-10-CM | POA: Diagnosis not present

## 2018-06-13 DIAGNOSIS — R42 Dizziness and giddiness: Secondary | ICD-10-CM | POA: Diagnosis not present

## 2018-06-14 DIAGNOSIS — G4700 Insomnia, unspecified: Secondary | ICD-10-CM | POA: Diagnosis not present

## 2018-06-14 DIAGNOSIS — R51 Headache: Secondary | ICD-10-CM | POA: Diagnosis not present

## 2018-06-14 DIAGNOSIS — R0981 Nasal congestion: Secondary | ICD-10-CM | POA: Diagnosis not present

## 2018-06-14 DIAGNOSIS — G4733 Obstructive sleep apnea (adult) (pediatric): Secondary | ICD-10-CM | POA: Diagnosis not present

## 2018-06-28 DIAGNOSIS — I10 Essential (primary) hypertension: Secondary | ICD-10-CM | POA: Diagnosis not present

## 2018-06-28 DIAGNOSIS — G4733 Obstructive sleep apnea (adult) (pediatric): Secondary | ICD-10-CM | POA: Diagnosis not present

## 2018-06-28 DIAGNOSIS — R42 Dizziness and giddiness: Secondary | ICD-10-CM | POA: Diagnosis not present

## 2018-07-08 DIAGNOSIS — J0140 Acute pansinusitis, unspecified: Secondary | ICD-10-CM | POA: Diagnosis not present

## 2018-07-13 DIAGNOSIS — L03012 Cellulitis of left finger: Secondary | ICD-10-CM | POA: Diagnosis not present

## 2018-07-24 DIAGNOSIS — Z6839 Body mass index (BMI) 39.0-39.9, adult: Secondary | ICD-10-CM | POA: Diagnosis not present

## 2018-07-24 DIAGNOSIS — J320 Chronic maxillary sinusitis: Secondary | ICD-10-CM | POA: Diagnosis not present

## 2018-07-24 DIAGNOSIS — I10 Essential (primary) hypertension: Secondary | ICD-10-CM | POA: Diagnosis not present

## 2018-07-31 DIAGNOSIS — J320 Chronic maxillary sinusitis: Secondary | ICD-10-CM | POA: Diagnosis not present

## 2018-08-09 DIAGNOSIS — J329 Chronic sinusitis, unspecified: Secondary | ICD-10-CM | POA: Diagnosis not present

## 2018-09-04 DIAGNOSIS — J329 Chronic sinusitis, unspecified: Secondary | ICD-10-CM | POA: Diagnosis not present

## 2018-09-13 DIAGNOSIS — G4733 Obstructive sleep apnea (adult) (pediatric): Secondary | ICD-10-CM | POA: Diagnosis not present

## 2018-09-21 DIAGNOSIS — G4733 Obstructive sleep apnea (adult) (pediatric): Secondary | ICD-10-CM | POA: Diagnosis not present

## 2018-09-21 DIAGNOSIS — J329 Chronic sinusitis, unspecified: Secondary | ICD-10-CM | POA: Diagnosis not present

## 2019-01-02 DIAGNOSIS — R52 Pain, unspecified: Secondary | ICD-10-CM | POA: Diagnosis not present

## 2019-01-02 DIAGNOSIS — R5383 Other fatigue: Secondary | ICD-10-CM | POA: Diagnosis not present

## 2019-01-02 DIAGNOSIS — R05 Cough: Secondary | ICD-10-CM | POA: Diagnosis not present

## 2019-01-02 DIAGNOSIS — Z20828 Contact with and (suspected) exposure to other viral communicable diseases: Secondary | ICD-10-CM | POA: Diagnosis not present

## 2019-01-03 DIAGNOSIS — J0191 Acute recurrent sinusitis, unspecified: Secondary | ICD-10-CM | POA: Diagnosis not present

## 2019-01-08 ENCOUNTER — Other Ambulatory Visit: Payer: Self-pay | Admitting: Emergency Medicine

## 2019-01-08 DIAGNOSIS — Z87898 Personal history of other specified conditions: Secondary | ICD-10-CM

## 2019-01-08 DIAGNOSIS — Z8659 Personal history of other mental and behavioral disorders: Secondary | ICD-10-CM

## 2019-01-08 NOTE — Telephone Encounter (Signed)
Please advise 

## 2019-01-09 DIAGNOSIS — G4733 Obstructive sleep apnea (adult) (pediatric): Secondary | ICD-10-CM | POA: Diagnosis not present

## 2019-01-15 DIAGNOSIS — J329 Chronic sinusitis, unspecified: Secondary | ICD-10-CM | POA: Diagnosis not present

## 2019-01-24 DIAGNOSIS — G4733 Obstructive sleep apnea (adult) (pediatric): Secondary | ICD-10-CM | POA: Diagnosis not present

## 2019-01-29 DIAGNOSIS — J029 Acute pharyngitis, unspecified: Secondary | ICD-10-CM | POA: Diagnosis not present

## 2019-01-29 DIAGNOSIS — Z9109 Other allergy status, other than to drugs and biological substances: Secondary | ICD-10-CM | POA: Diagnosis not present

## 2019-01-29 DIAGNOSIS — G4733 Obstructive sleep apnea (adult) (pediatric): Secondary | ICD-10-CM | POA: Diagnosis not present

## 2019-02-08 DIAGNOSIS — J029 Acute pharyngitis, unspecified: Secondary | ICD-10-CM | POA: Diagnosis not present

## 2019-02-08 DIAGNOSIS — J309 Allergic rhinitis, unspecified: Secondary | ICD-10-CM | POA: Diagnosis not present

## 2019-02-08 DIAGNOSIS — R0981 Nasal congestion: Secondary | ICD-10-CM | POA: Diagnosis not present

## 2019-03-15 DIAGNOSIS — F439 Reaction to severe stress, unspecified: Secondary | ICD-10-CM | POA: Diagnosis not present

## 2019-06-04 DIAGNOSIS — R0989 Other specified symptoms and signs involving the circulatory and respiratory systems: Secondary | ICD-10-CM | POA: Diagnosis not present

## 2019-06-04 DIAGNOSIS — R070 Pain in throat: Secondary | ICD-10-CM | POA: Diagnosis not present

## 2019-06-04 DIAGNOSIS — K219 Gastro-esophageal reflux disease without esophagitis: Secondary | ICD-10-CM | POA: Diagnosis not present

## 2019-07-16 ENCOUNTER — Ambulatory Visit: Payer: Federal, State, Local not specified - PPO | Admitting: Emergency Medicine

## 2019-07-22 ENCOUNTER — Encounter: Payer: Self-pay | Admitting: Emergency Medicine

## 2019-09-05 ENCOUNTER — Ambulatory Visit: Payer: Federal, State, Local not specified - PPO | Admitting: Emergency Medicine

## 2019-10-31 ENCOUNTER — Ambulatory Visit: Payer: Federal, State, Local not specified - PPO | Admitting: Emergency Medicine

## 2019-10-31 ENCOUNTER — Encounter: Payer: Self-pay | Admitting: Emergency Medicine

## 2019-10-31 ENCOUNTER — Other Ambulatory Visit: Payer: Self-pay

## 2019-10-31 ENCOUNTER — Ambulatory Visit: Payer: Federal, State, Local not specified - PPO

## 2019-10-31 VITALS — BP 128/78 | HR 76 | Temp 98.6°F | Resp 16 | Ht 71.0 in | Wt 262.0 lb

## 2019-10-31 DIAGNOSIS — Z8659 Personal history of other mental and behavioral disorders: Secondary | ICD-10-CM | POA: Diagnosis not present

## 2019-10-31 DIAGNOSIS — I10 Essential (primary) hypertension: Secondary | ICD-10-CM

## 2019-10-31 DIAGNOSIS — G4733 Obstructive sleep apnea (adult) (pediatric): Secondary | ICD-10-CM | POA: Diagnosis not present

## 2019-10-31 DIAGNOSIS — M51369 Other intervertebral disc degeneration, lumbar region without mention of lumbar back pain or lower extremity pain: Secondary | ICD-10-CM

## 2019-10-31 DIAGNOSIS — Z87898 Personal history of other specified conditions: Secondary | ICD-10-CM | POA: Diagnosis not present

## 2019-10-31 DIAGNOSIS — M5136 Other intervertebral disc degeneration, lumbar region: Secondary | ICD-10-CM

## 2019-10-31 NOTE — Progress Notes (Signed)
Dylan Washington 59 y.o.   Chief Complaint  Patient presents with  . Hypertension    per pt he wants to discuss medications  Family physician Dr. Eliane Decree most recent office visit notes as follows: HPI Patient presents today for follow-up of his blood pressures. He states that he recently saw the cardiologist who changed his medications around. His Diovan was increased to 320 mg a day and his hydrochlorothiazide was switched to chlorthalidone 25 mg a day. He is still using his carvedilol 12.5 mg twice daily in addition to hydralazine and clonidine. He is prescribed hydralazine 3 times a day and the clonidine twice a day, but notes that he has been using it more than this, oftentimes taking extra doses if he checks his blood pressure and it is elevated. He does work night shift and so oftentimes will check his blood pressure around 4 AM and it gets elevated. When he sees this, he will take an extra hydralazine or clonidine or both.  He was supposed to have a loop recorder implanted because of episodes of tachycardia. However, after further discussing this with his cardiologist, they decided to get him a fit bit that could monitor his EKG and hold off on the loop recorder.  He has been trying to work on lifestyle modification and increasing exercise. He has lost some weight since his last visit here.  Most recent cardiologist office notes as follows: ASSESSMENT:  1. Tachycardia 2. History of stroke 3. Essential hypertension 4. Obesity 5. OSA on CPAP  PLAN:  We went over his Fitbit ECG app. He understands how to record ECGs, and I have given him my email to send me pdf of any tachycardia. He reports systolic BP uncontrolled recently, as high as 200 a few nights ago. -Switch HCTZ to Chlorthalidone -Increase Diovan to 320mg  daily -I expect that with these changes, he would need less Clonidine or Hydralazine -Referral to sleep clinic for re-titration of CPAP. He has been changing the pressures  on his own recently.  Follow up in 6 months  HISTORY OF PRESENT ILLNESS: This is a 59 y.o. male former patient of mine last office visit 03/16/2018.  Moved to [place] and has been seeing Dr. [place] and Dr. Allena Katz, family medicine and cardiologist respectively, in the [place] area.  Most recent visits 1 to 2 weeks ago.  Has a history of hypertension and is presently on Diovan 320 mg daily and chlorthalidone 25 mg daily.  Advised to take clonidine or hydralazine as needed. Patient works at [employer] since [employer] but is not happy with the culture at work.  His blood pressure is elevated when at work and normal when at home.  States he had a TIA last February.  Has questions about his blood pressure and medications.  Also has concerns about FMLA and work status. No other complaints or medical concerns today.  HPI   Prior to Admission medications   Medication Sig Start Date End Date Taking? Authorizing Provider  aspirin EC 81 MG tablet Take 1 tablet (81 mg total) by mouth daily. 12/22/16  Yes Stallings, Zoe A, MD  fluticasone (FLONASE) 50 MCG/ACT nasal spray Place 2 sprays into both nostrils daily. 10/22/16  Yes 10/24/16, MD  hydrALAZINE (APRESOLINE) 50 MG tablet TAKE 1 TABLET BY MOUTH THREE TIMES DAILY 08/08/17  Yes Laylamarie Meuser, 08/10/17, MD  Multiple Vitamins-Minerals (ADEKS) chewable tablet Chew 1 tablet by mouth daily.   Yes [provider]  traZODone (DESYREL) 100 MG tablet TAKE  1 TABLET(100 MG) BY MOUTH AT BEDTIME 05/21/18  Yes Destin Vinsant, Eilleen Kempf, MD  lisinopril (PRINIVIL,ZESTRIL) 10 MG tablet Take 1 tablet (10 mg total) by mouth daily. 11/01/17 01/30/18  Georgeanna Lea, MD  metoprolol tartrate (LOPRESSOR) 25 MG tablet Take 1 tablet (25 mg total) by mouth 2 (two) times daily. 12/12/17 03/12/18  Georgeanna Lea, MD  tadalafil (CIALIS) 5 MG tablet Take 1 tablet (5 mg total) by mouth daily as needed for erectile dysfunction. Patient not taking: Reported on 10/31/2019  11/22/16   Doristine Bosworth, MD    Allergies  Allergen Reactions  . Amlodipine Other (See Comments)    Per pt caused speech problems  . Losartan Other (See Comments)    Not sure    Patient Active Problem List   Diagnosis Date Noted  . Degenerative disc disease, lumbar 03/16/2018  . History of depression 08/16/2017  . History of insomnia 08/16/2017  . Chronic tension-type headache, not intractable 11/14/2016  . Bone spur of foot 02/08/2016  . RBBB 04/02/2014  . Asthma 01/17/2012  . Environmental allergies 08/16/2011  . Hypertension   . Sleep apnea     Past Medical History:  Diagnosis Date  . Allergy    allergy shots at [facility]  . Asthma   . Hypertension   . Sleep apnea     Past Surgical History:  Procedure Laterality Date  . ACHILLES TENDON REPAIR    . NASAL SINUS SURGERY      Social History   Socioeconomic History  . Marital status: Single    Spouse name: Not on file  . Number of children: 0  . Years of education: college  . Highest education level: Not on file  Occupational History    Employer: [employer]  Tobacco Use  . Smoking status: Former Smoker    Quit date: 06/30/2017    Years since quitting: 2.3  . Smokeless tobacco: Never Used  . Tobacco comment: 3 cig a day   Substance and Sexual Activity  . Alcohol use: No    Alcohol/week: 0.0 standard drinks  . Drug use: No  . Sexual activity: Yes  Other Topics Concern  . Not on file  Social History Narrative   Marital status: married;       Children: none      Lives: with wife.      Employment: works at [employer]; Solicitor.      Tobacco: none      Alcohol: none      Drugs: none      Exercise: gym 4-5 days per week.   Denies  Caffeine use    Social Determinants of Health   Financial Resource Strain:   . Difficulty of Paying Living Expenses:   Food Insecurity:   . Worried About Programme researcher, broadcasting/film/video in the Last Year:   . Barista in the Last Year:   Transportation Needs:    . Freight forwarder (Medical):   Marland Kitchen Lack of Transportation (Non-Medical):   Physical Activity:   . Days of Exercise per Week:   . Minutes of Exercise per Session:   Stress:   . Feeling of Stress :   Social Connections:   . Frequency of Communication with Friends and Family:   . Frequency of Social Gatherings with Friends and Family:   . Attends Religious Services:   . Active Member of Clubs or Organizations:   . Attends Banker Meetings:   .  Marital Status:   Intimate Partner Violence:   . Fear of Current or Ex-Partner:   . Emotionally Abused:   . Physically Abused:   . Sexually AbusMarland Kitchened:     Family History  Problem Relation Age of Onset  . Hypertension Mother   . Cancer Mother   . Hypertension Father      Review of Systems  Constitutional: Negative.  Negative for chills and fever.  HENT: Negative.  Negative for congestion and sore throat.   Eyes: Negative.   Respiratory: Negative.  Negative for cough and shortness of breath.   Cardiovascular: Negative.  Negative for chest pain and palpitations.  Gastrointestinal: Negative.  Negative for abdominal pain, blood in stool, diarrhea, nausea and vomiting.  Genitourinary: Negative.  Negative for dysuria and hematuria.  Musculoskeletal: Negative.  Negative for back pain, myalgias and neck pain.  Skin: Negative.  Negative for rash.  Neurological: Negative.  Negative for dizziness and headaches.  All other systems reviewed and are negative.  Vitals:   10/31/19 1106  BP: 128/78  Pulse: 76  Resp: 16  Temp: 98.6 F (37 C)  SpO2: 97%     Physical Exam Vitals reviewed.  Constitutional:      Appearance: Normal appearance.  HENT:     Head: Normocephalic.  Eyes:     Extraocular Movements: Extraocular movements intact.     Pupils: Pupils are equal, round, and reactive to light.  Cardiovascular:     Rate and Rhythm: Normal rate and regular rhythm.     Pulses: Normal pulses.     Heart sounds: Normal heart  sounds.  Pulmonary:     Effort: Pulmonary effort is normal.     Breath sounds: Normal breath sounds.  Musculoskeletal:        General: Normal range of motion.     Cervical back: Normal range of motion and neck supple.  Skin:    General: Skin is warm and dry.     Capillary Refill: Capillary refill takes less than 2 seconds.  Neurological:     General: No focal deficit present.     Mental Status: He is alert and oriented to person, place, and time.  Psychiatric:        Mood and Affect: Mood normal.        Behavior: Behavior normal.      ASSESSMENT & PLAN: Clinically stable.  Blood pressure normal.  No medical concerns identified during this visit. Advised to continue following up with his family physician and cardiologist back home in [place]. Any work related/FMLA issues/forms should be handled by his present primary care physician and cardiologist. As of today I did inform Mr. Vonita Mosseterson that I am not his primary care physician and do not want to interfere with the care provided by his present physicians.  I agree with the present medical management.  He understands well.  All questions were answered.  [name] was seen today for hypertension.  Diagnoses and all orders for this visit:  Essential hypertension  Obstructive sleep apnea syndrome  History of depression  History of insomnia  Degenerative disc disease, lumbar    Patient Instructions       If you have lab work done today you will be contacted with your lab results within the next 2 weeks.  If you have not heard from us then please contact us. The fastest way to get your results is to register for My Chart.   IF you received an x-ray today, you will receive an  invoice from [facility]. Please contact [facility] at [phone] with questions or concerns regarding your invoice.   IF you received labwork today, you will receive an invoice from [facility]. Please contact [facility] at [phone]  with questions or concerns regarding your invoice.   Our billing staff will not be able to assist you with questions regarding bills from these companies.  You will be contacted with the lab results as soon as they are available. The fastest way to get your results is to activate your My Chart account. Instructions are located on the last page of this paperwork. If you have not heard from [place] regarding the results in 2 weeks, please contact this office.     Hypertension, Adult High blood pressure (hypertension) is when the force of blood pumping through the arteries is too strong. The arteries are the blood vessels that carry blood from the heart throughout the body. Hypertension forces the heart to work harder to pump blood and may cause arteries to become narrow or stiff. Untreated or uncontrolled hypertension can cause a heart attack, heart failure, a stroke, kidney disease, and other problems. A blood pressure reading consists of a higher number over a lower number. Ideally, your blood pressure should be below 120/80. The first ("top") number is called the systolic pressure. It is a measure of the pressure in your arteries as your heart beats. The second ("bottom") number is called the diastolic pressure. It is a measure of the pressure in your arteries as the heart relaxes. What are the causes? The exact cause of this condition is not known. There are some conditions that result in or are related to high blood pressure. What increases the risk? Some risk factors for high blood pressure are under your control. The following factors may make you more likely to develop this condition:  Smoking.  Having type 2 diabetes mellitus, high cholesterol, or both.  Not getting enough exercise or physical activity.  Being overweight.  Having too much fat, sugar, calories, or salt (sodium) in your diet.  Drinking too much alcohol. Some risk factors for high blood pressure may be difficult or  impossible to change. Some of these factors include:  Having chronic kidney disease.  Having a family history of high blood pressure.  Age. Risk increases with age.  Race. You may be at higher risk if you are African American.  Gender. Men are at higher risk than women before age 8. After age 39, women are at higher risk than men.  Having obstructive sleep apnea.  Stress. What are the signs or symptoms? High blood pressure may not cause symptoms. Very high blood pressure (hypertensive crisis) may cause:  Headache.  Anxiety.  Shortness of breath.  Nosebleed.  Nausea and vomiting.  Vision changes.  Severe chest pain.  Seizures. How is this diagnosed? This condition is diagnosed by measuring your blood pressure while you are seated, with your arm resting on a flat surface, your legs uncrossed, and your feet flat on the floor. The cuff of the blood pressure monitor will be placed directly against the skin of your upper arm at the level of your heart. It should be measured at least twice using the same arm. Certain conditions can cause a difference in blood pressure between your right and left arms. Certain factors can cause blood pressure readings to be lower or higher than normal for a short period of time:  When your blood pressure is higher when you are in a health  care provider's office than when you are at home, this is called white coat hypertension. Most people with this condition do not need medicines.  When your blood pressure is higher at home than when you are in a health care provider's office, this is called masked hypertension. Most people with this condition may need medicines to control blood pressure. If you have a high blood pressure reading during one visit or you have normal blood pressure with other risk factors, you may be asked to:  Return on a different day to have your blood pressure checked again.  Monitor your blood pressure at home for 1 week or  longer. If you are diagnosed with hypertension, you may have other blood or imaging tests to help your health care provider understand your overall risk for other conditions. How is this treated? This condition is treated by making healthy lifestyle changes, such as eating healthy foods, exercising more, and reducing your alcohol intake. Your health care provider may prescribe medicine if lifestyle changes are not enough to get your blood pressure under control, and if:  Your systolic blood pressure is above 130.  Your diastolic blood pressure is above 80. Your personal target blood pressure may vary depending on your medical conditions, your age, and other factors. Follow these instructions at home: Eating and drinking   Eat a diet that is high in fiber and potassium, and low in sodium, added sugar, and fat. An example eating plan is called the DASH (Dietary Approaches to Stop Hypertension) diet. To eat this way: ? Eat plenty of fresh fruits and vegetables. Try to fill one half of your plate at each meal with fruits and vegetables. ? Eat whole grains, such as whole-wheat pasta, brown rice, or whole-grain bread. Fill about one fourth of your plate with whole grains. ? Eat or drink low-fat dairy products, such as skim milk or low-fat yogurt. ? Avoid fatty cuts of meat, processed or cured meats, and poultry with skin. Fill about one fourth of your plate with lean proteins, such as fish, chicken without skin, beans, eggs, or tofu. ? Avoid pre-made and processed foods. These tend to be higher in sodium, added sugar, and fat.  Reduce your daily sodium intake. Most people with hypertension should eat less than 1,500 mg of sodium a day.  Do not drink alcohol if: ? Your health care provider tells you not to drink. ? You are pregnant, may be pregnant, or are planning to become pregnant.  If you drink alcohol: ? Limit how much you use to:  0-1 drink a day for women.  0-2 drinks a day for  men. ? Be aware of how much alcohol is in your drink. In the [place] one drink equals one 12 oz bottle of beer (355 mL), one 5 oz glass of wine (148 mL), or one 1 oz glass of hard liquor (44 mL). Lifestyle   Work with your health care provider to maintain a healthy body weight or to lose weight. Ask what an ideal weight is for you.  Get at least 30 minutes of exercise most days of the week. Activities may include walking, swimming, or biking.  Include exercise to strengthen your muscles (resistance exercise), such as Pilates or lifting weights, as part of your weekly exercise routine. Try to do these types of exercises for 30 minutes at least 3 days a week.  Do not use any products that contain nicotine or tobacco, such as cigarettes, e-cigarettes, and chewing tobacco. If you  need help quitting, ask your health care provider.  Monitor your blood pressure at home as told by your health care provider.  Keep all follow-up visits as told by your health care provider. This is important. Medicines  Take over-the-counter and prescription medicines only as told by your health care provider. Follow directions carefully. Blood pressure medicines must be taken as prescribed.  Do not skip doses of blood pressure medicine. Doing this puts you at risk for problems and can make the medicine less effective.  Ask your health care provider about side effects or reactions to medicines that you should watch for. Contact a health care provider if you:  Think you are having a reaction to a medicine you are taking.  Have headaches that keep coming back (recurring).  Feel dizzy.  Have swelling in your ankles.  Have trouble with your vision. Get help right away if you:  Develop a severe headache or confusion.  Have unusual weakness or numbness.  Feel faint.  Have severe pain in your chest or abdomen.  Vomit repeatedly.  Have trouble breathing. Summary  Hypertension is when the force of blood  pumping through your arteries is too strong. If this condition is not controlled, it may put you at risk for serious complications.  Your personal target blood pressure may vary depending on your medical conditions, your age, and other factors. For most people, a normal blood pressure is less than 120/80.  Hypertension is treated with lifestyle changes, medicines, or a combination of both. Lifestyle changes include losing weight, eating a healthy, low-sodium diet, exercising more, and limiting alcohol. This information is not intended to replace advice given to you by your health care provider. Make sure you discuss any questions you have with your health care provider. Document Revised: 03/07/2018 Document Reviewed: 03/07/2018 Elsevier Patient Education  2020 Elsevier Inc.      Agustina Caroli, MD [facility]

## 2019-10-31 NOTE — Patient Instructions (Addendum)
   If you have lab work done today you will be contacted with your lab results within the next 2 weeks.  If you have not heard from us then please contact us. The fastest way to get your results is to register for My Chart.   IF you received an x-ray today, you will receive an invoice from [facility]. Please contact [facility] at [phone] with questions or concerns regarding your invoice.   IF you received labwork today, you will receive an invoice from [facility]. Please contact [facility] at [phone] with questions or concerns regarding your invoice.   Our billing staff will not be able to assist you with questions regarding bills from these companies.  You will be contacted with the lab results as soon as they are available. The fastest way to get your results is to activate your My Chart account. Instructions are located on the last page of this paperwork. If you have not heard from us regarding the results in 2 weeks, please contact this office.      Hypertension, Adult High blood pressure (hypertension) is when the force of blood pumping through the arteries is too strong. The arteries are the blood vessels that carry blood from the heart throughout the body. Hypertension forces the heart to work harder to pump blood and may cause arteries to become narrow or stiff. Untreated or uncontrolled hypertension can cause a heart attack, heart failure, a stroke, kidney disease, and other problems. A blood pressure reading consists of a higher number over a lower number. Ideally, your blood pressure should be below 120/80. The first ("top") number is called the systolic pressure. It is a measure of the pressure in your arteries as your heart beats. The second ("bottom") number is called the diastolic pressure. It is a measure of the pressure in your arteries as the heart relaxes. What are the causes? The exact cause of this condition is not known. There are some conditions  that result in or are related to high blood pressure. What increases the risk? Some risk factors for high blood pressure are under your control. The following factors may make you more likely to develop this condition:  Smoking.  Having type 2 diabetes mellitus, high cholesterol, or both.  Not getting enough exercise or physical activity.  Being overweight.  Having too much fat, sugar, calories, or salt (sodium) in your diet.  Drinking too much alcohol. Some risk factors for high blood pressure may be difficult or impossible to change. Some of these factors include:  Having chronic kidney disease.  Having a family history of high blood pressure.  Age. Risk increases with age.  Race. You may be at higher risk if you are African American.  Gender. Men are at higher risk than women before age 45. After age 65, women are at higher risk than men.  Having obstructive sleep apnea.  Stress. What are the signs or symptoms? High blood pressure may not cause symptoms. Very high blood pressure (hypertensive crisis) may cause:  Headache.  Anxiety.  Shortness of breath.  Nosebleed.  Nausea and vomiting.  Vision changes.  Severe chest pain.  Seizures. How is this diagnosed? This condition is diagnosed by measuring your blood pressure while you are seated, with your arm resting on a flat surface, your legs uncrossed, and your feet flat on the floor. The cuff of the blood pressure monitor will be placed directly against the skin of your upper arm at the level of your   heart. It should be measured at least twice using the same arm. Certain conditions can cause a difference in blood pressure between your right and left arms. Certain factors can cause blood pressure readings to be lower or higher than normal for a short period of time:  When your blood pressure is higher when you are in a health care provider's office than when you are at home, this is called white coat hypertension.  Most people with this condition do not need medicines.  When your blood pressure is higher at home than when you are in a health care provider's office, this is called masked hypertension. Most people with this condition may need medicines to control blood pressure. If you have a high blood pressure reading during one visit or you have normal blood pressure with other risk factors, you may be asked to:  Return on a different day to have your blood pressure checked again.  Monitor your blood pressure at home for 1 week or longer. If you are diagnosed with hypertension, you may have other blood or imaging tests to help your health care provider understand your overall risk for other conditions. How is this treated? This condition is treated by making healthy lifestyle changes, such as eating healthy foods, exercising more, and reducing your alcohol intake. Your health care provider may prescribe medicine if lifestyle changes are not enough to get your blood pressure under control, and if:  Your systolic blood pressure is above 130.  Your diastolic blood pressure is above 80. Your personal target blood pressure may vary depending on your medical conditions, your age, and other factors. Follow these instructions at home: Eating and drinking   Eat a diet that is high in fiber and potassium, and low in sodium, added sugar, and fat. An example eating plan is called the DASH (Dietary Approaches to Stop Hypertension) diet. To eat this way: ? Eat plenty of fresh fruits and vegetables. Try to fill one half of your plate at each meal with fruits and vegetables. ? Eat whole grains, such as whole-wheat pasta, brown rice, or whole-grain bread. Fill about one fourth of your plate with whole grains. ? Eat or drink low-fat dairy products, such as skim milk or low-fat yogurt. ? Avoid fatty cuts of meat, processed or cured meats, and poultry with skin. Fill about one fourth of your plate with lean proteins, such  as fish, chicken without skin, beans, eggs, or tofu. ? Avoid pre-made and processed foods. These tend to be higher in sodium, added sugar, and fat.  Reduce your daily sodium intake. Most people with hypertension should eat less than 1,500 mg of sodium a day.  Do not drink alcohol if: ? Your health care provider tells you not to drink. ? You are pregnant, may be pregnant, or are planning to become pregnant.  If you drink alcohol: ? Limit how much you use to:  0-1 drink a day for women.  0-2 drinks a day for men. ? Be aware of how much alcohol is in your drink. In the [place] one drink equals one 12 oz bottle of beer (355 mL), one 5 oz glass of wine (148 mL), or one 1 oz glass of hard liquor (44 mL). Lifestyle   Work with your health care provider to maintain a healthy body weight or to lose weight. Ask what an ideal weight is for you.  Get at least 30 minutes of exercise most days of the week. Activities may include walking, swimming,   or biking.  Include exercise to strengthen your muscles (resistance exercise), such as Pilates or lifting weights, as part of your weekly exercise routine. Try to do these types of exercises for 30 minutes at least 3 days a week.  Do not use any products that contain nicotine or tobacco, such as cigarettes, e-cigarettes, and chewing tobacco. If you need help quitting, ask your health care provider.  Monitor your blood pressure at home as told by your health care provider.  Keep all follow-up visits as told by your health care provider. This is important. Medicines  Take over-the-counter and prescription medicines only as told by your health care provider. Follow directions carefully. Blood pressure medicines must be taken as prescribed.  Do not skip doses of blood pressure medicine. Doing this puts you at risk for problems and can make the medicine less effective.  Ask your health care provider about side effects or reactions to medicines that you  should watch for. Contact a health care provider if you:  Think you are having a reaction to a medicine you are taking.  Have headaches that keep coming back (recurring).  Feel dizzy.  Have swelling in your ankles.  Have trouble with your vision. Get help right away if you:  Develop a severe headache or confusion.  Have unusual weakness or numbness.  Feel faint.  Have severe pain in your chest or abdomen.  Vomit repeatedly.  Have trouble breathing. Summary  Hypertension is when the force of blood pumping through your arteries is too strong. If this condition is not controlled, it may put you at risk for serious complications.  Your personal target blood pressure may vary depending on your medical conditions, your age, and other factors. For most people, a normal blood pressure is less than 120/80.  Hypertension is treated with lifestyle changes, medicines, or a combination of both. Lifestyle changes include losing weight, eating a healthy, low-sodium diet, exercising more, and limiting alcohol. This information is not intended to replace advice given to you by your health care provider. Make sure you discuss any questions you have with your health care provider. Document Revised: 03/07/2018 Document Reviewed: 03/07/2018 [employer] Patient Education  2020 [employer]
